# Patient Record
Sex: Female | Born: 1975 | Hispanic: Yes | Marital: Married | Smoking: Never smoker
Health system: Southern US, Community
[De-identification: ages and names within clinical notes are randomized; demographics above are authoritative.]

## PROBLEM LIST (undated history)

## (undated) DIAGNOSIS — E875 Hyperkalemia: Secondary | ICD-10-CM

## (undated) DIAGNOSIS — I10 Essential (primary) hypertension: Secondary | ICD-10-CM

---

## 2016-10-04 ENCOUNTER — Encounter (HOSPITAL_COMMUNITY): Payer: Self-pay | Admitting: Emergency Medicine

## 2016-10-04 ENCOUNTER — Inpatient Hospital Stay (HOSPITAL_COMMUNITY)
Admission: EM | Admit: 2016-10-04 | Discharge: 2016-11-15 | DRG: 003 | Disposition: A | Discharge status: Skilled Nursing Facility | Payer: 59 | Attending: Family Medicine | Admitting: Family Medicine

## 2016-10-04 ENCOUNTER — Emergency Department (HOSPITAL_COMMUNITY): Payer: 59

## 2016-10-04 DIAGNOSIS — Z8673 Personal history of transient ischemic attack (TIA), and cerebral infarction without residual deficits: Secondary | ICD-10-CM | POA: Diagnosis not present

## 2016-10-04 DIAGNOSIS — G9340 Encephalopathy, unspecified: Secondary | ICD-10-CM | POA: Diagnosis present

## 2016-10-04 DIAGNOSIS — R29732 NIHSS score 32: Secondary | ICD-10-CM | POA: Diagnosis present

## 2016-10-04 DIAGNOSIS — I6300 Cerebral infarction due to thrombosis of unspecified precerebral artery: Secondary | ICD-10-CM

## 2016-10-04 DIAGNOSIS — Z982 Presence of cerebrospinal fluid drainage device: Secondary | ICD-10-CM | POA: Diagnosis not present

## 2016-10-04 DIAGNOSIS — J9690 Respiratory failure, unspecified, unspecified whether with hypoxia or hypercapnia: Secondary | ICD-10-CM

## 2016-10-04 DIAGNOSIS — R58 Hemorrhage, not elsewhere classified: Secondary | ICD-10-CM | POA: Diagnosis not present

## 2016-10-04 DIAGNOSIS — R739 Hyperglycemia, unspecified: Secondary | ICD-10-CM | POA: Diagnosis present

## 2016-10-04 DIAGNOSIS — Z7189 Other specified counseling: Secondary | ICD-10-CM

## 2016-10-04 DIAGNOSIS — R259 Unspecified abnormal involuntary movements: Secondary | ICD-10-CM | POA: Diagnosis not present

## 2016-10-04 DIAGNOSIS — R402112 Coma scale, eyes open, never, at arrival to emergency department: Secondary | ICD-10-CM | POA: Diagnosis present

## 2016-10-04 DIAGNOSIS — I619 Nontraumatic intracerebral hemorrhage, unspecified: Secondary | ICD-10-CM

## 2016-10-04 DIAGNOSIS — G911 Obstructive hydrocephalus: Secondary | ICD-10-CM | POA: Diagnosis present

## 2016-10-04 DIAGNOSIS — D72829 Elevated white blood cell count, unspecified: Secondary | ICD-10-CM | POA: Diagnosis not present

## 2016-10-04 DIAGNOSIS — J9601 Acute respiratory failure with hypoxia: Secondary | ICD-10-CM | POA: Diagnosis not present

## 2016-10-04 DIAGNOSIS — Z931 Gastrostomy status: Secondary | ICD-10-CM | POA: Diagnosis not present

## 2016-10-04 DIAGNOSIS — J150 Pneumonia due to Klebsiella pneumoniae: Secondary | ICD-10-CM | POA: Diagnosis not present

## 2016-10-04 DIAGNOSIS — I610 Nontraumatic intracerebral hemorrhage in hemisphere, subcortical: Secondary | ICD-10-CM | POA: Diagnosis present

## 2016-10-04 DIAGNOSIS — R4182 Altered mental status, unspecified: Secondary | ICD-10-CM

## 2016-10-04 DIAGNOSIS — J9600 Acute respiratory failure, unspecified whether with hypoxia or hypercapnia: Secondary | ICD-10-CM | POA: Diagnosis present

## 2016-10-04 DIAGNOSIS — R402212 Coma scale, best verbal response, none, at arrival to emergency department: Secondary | ICD-10-CM | POA: Diagnosis present

## 2016-10-04 DIAGNOSIS — E162 Hypoglycemia, unspecified: Secondary | ICD-10-CM

## 2016-10-04 DIAGNOSIS — J9811 Atelectasis: Secondary | ICD-10-CM | POA: Diagnosis not present

## 2016-10-04 DIAGNOSIS — Z781 Physical restraint status: Secondary | ICD-10-CM

## 2016-10-04 DIAGNOSIS — I160 Hypertensive urgency: Secondary | ICD-10-CM | POA: Diagnosis not present

## 2016-10-04 DIAGNOSIS — Z430 Encounter for attention to tracheostomy: Secondary | ICD-10-CM | POA: Diagnosis not present

## 2016-10-04 DIAGNOSIS — R4189 Other symptoms and signs involving cognitive functions and awareness: Secondary | ICD-10-CM

## 2016-10-04 DIAGNOSIS — I618 Other nontraumatic intracerebral hemorrhage: Secondary | ICD-10-CM | POA: Diagnosis present

## 2016-10-04 DIAGNOSIS — J398 Other specified diseases of upper respiratory tract: Secondary | ICD-10-CM

## 2016-10-04 DIAGNOSIS — Z23 Encounter for immunization: Secondary | ICD-10-CM

## 2016-10-04 DIAGNOSIS — R509 Fever, unspecified: Secondary | ICD-10-CM | POA: Diagnosis not present

## 2016-10-04 DIAGNOSIS — Z66 Do not resuscitate: Secondary | ICD-10-CM | POA: Diagnosis present

## 2016-10-04 DIAGNOSIS — B961 Klebsiella pneumoniae [K. pneumoniae] as the cause of diseases classified elsewhere: Secondary | ICD-10-CM | POA: Diagnosis not present

## 2016-10-04 DIAGNOSIS — R1310 Dysphagia, unspecified: Secondary | ICD-10-CM | POA: Diagnosis present

## 2016-10-04 DIAGNOSIS — R402434 Glasgow coma scale score 3-8, 24 hours or more after hospital admission: Secondary | ICD-10-CM | POA: Diagnosis not present

## 2016-10-04 DIAGNOSIS — G919 Hydrocephalus, unspecified: Secondary | ICD-10-CM

## 2016-10-04 DIAGNOSIS — I639 Cerebral infarction, unspecified: Secondary | ICD-10-CM | POA: Diagnosis present

## 2016-10-04 DIAGNOSIS — Z9911 Dependence on respirator [ventilator] status: Secondary | ICD-10-CM

## 2016-10-04 DIAGNOSIS — Z515 Encounter for palliative care: Secondary | ICD-10-CM | POA: Diagnosis not present

## 2016-10-04 DIAGNOSIS — Z79899 Other long term (current) drug therapy: Secondary | ICD-10-CM

## 2016-10-04 DIAGNOSIS — E440 Moderate protein-calorie malnutrition: Secondary | ICD-10-CM | POA: Diagnosis not present

## 2016-10-04 DIAGNOSIS — Z930 Tracheostomy status: Secondary | ICD-10-CM | POA: Diagnosis not present

## 2016-10-04 DIAGNOSIS — I615 Nontraumatic intracerebral hemorrhage, intraventricular: Secondary | ICD-10-CM | POA: Diagnosis present

## 2016-10-04 DIAGNOSIS — N390 Urinary tract infection, site not specified: Secondary | ICD-10-CM | POA: Diagnosis present

## 2016-10-04 DIAGNOSIS — I10 Essential (primary) hypertension: Secondary | ICD-10-CM | POA: Diagnosis not present

## 2016-10-04 DIAGNOSIS — R339 Retention of urine, unspecified: Secondary | ICD-10-CM | POA: Diagnosis not present

## 2016-10-04 DIAGNOSIS — I119 Hypertensive heart disease without heart failure: Secondary | ICD-10-CM | POA: Diagnosis present

## 2016-10-04 DIAGNOSIS — Z9119 Patient's noncompliance with other medical treatment and regimen: Secondary | ICD-10-CM

## 2016-10-04 DIAGNOSIS — R52 Pain, unspecified: Secondary | ICD-10-CM

## 2016-10-04 DIAGNOSIS — J9610 Chronic respiratory failure, unspecified whether with hypoxia or hypercapnia: Secondary | ICD-10-CM

## 2016-10-04 DIAGNOSIS — I613 Nontraumatic intracerebral hemorrhage in brain stem: Secondary | ICD-10-CM | POA: Diagnosis not present

## 2016-10-04 DIAGNOSIS — R402312 Coma scale, best motor response, none, at arrival to emergency department: Secondary | ICD-10-CM | POA: Diagnosis present

## 2016-10-04 DIAGNOSIS — R0603 Acute respiratory distress: Secondary | ICD-10-CM

## 2016-10-04 DIAGNOSIS — E875 Hyperkalemia: Secondary | ICD-10-CM | POA: Diagnosis not present

## 2016-10-04 DIAGNOSIS — J9620 Acute and chronic respiratory failure, unspecified whether with hypoxia or hypercapnia: Secondary | ICD-10-CM | POA: Diagnosis not present

## 2016-10-04 DIAGNOSIS — Z01818 Encounter for other preprocedural examination: Secondary | ICD-10-CM

## 2016-10-04 HISTORY — DX: Essential (primary) hypertension: I10

## 2016-10-04 HISTORY — DX: Hyperkalemia: E87.5

## 2016-10-04 LAB — I-STAT CHEM 8, ED
BUN: 11 mg/dL (ref 6–20)
CALCIUM ION: 1.04 mmol/L — AB (ref 1.15–1.40)
CHLORIDE: 106 mmol/L (ref 101–111)
Creatinine, Ser: 1 mg/dL (ref 0.44–1.00)
GLUCOSE: 99 mg/dL (ref 65–99)
HCT: 46 % (ref 36.0–46.0)
Hemoglobin: 15.6 g/dL — ABNORMAL HIGH (ref 12.0–15.0)
Potassium: 5.6 mmol/L — ABNORMAL HIGH (ref 3.5–5.1)
Sodium: 138 mmol/L (ref 135–145)
TCO2: 24 mmol/L (ref 0–100)

## 2016-10-04 LAB — I-STAT ARTERIAL BLOOD GAS, ED
Acid-base deficit: 6 mmol/L — ABNORMAL HIGH (ref 0.0–2.0)
BICARBONATE: 20.4 mmol/L (ref 20.0–28.0)
O2 SAT: 100 %
Patient temperature: 101.1
TCO2: 22 mmol/L (ref 0–100)
pCO2 arterial: 44.7 mmHg (ref 32.0–48.0)
pH, Arterial: 7.274 — ABNORMAL LOW (ref 7.350–7.450)
pO2, Arterial: 539 mmHg — ABNORMAL HIGH (ref 83.0–108.0)

## 2016-10-04 LAB — DIFFERENTIAL
BASOS ABS: 0 10*3/uL (ref 0.0–0.1)
Basophils Relative: 0 %
Eosinophils Absolute: 0.1 10*3/uL (ref 0.0–0.7)
Eosinophils Relative: 2 %
LYMPHS ABS: 3.2 10*3/uL (ref 0.7–4.0)
LYMPHS PCT: 34 %
MONOS PCT: 6 %
Monocytes Absolute: 0.6 10*3/uL (ref 0.1–1.0)
NEUTROS ABS: 5.5 10*3/uL (ref 1.7–7.7)
Neutrophils Relative %: 58 %

## 2016-10-04 LAB — URINALYSIS, ROUTINE W REFLEX MICROSCOPIC
Bacteria, UA: NONE SEEN
Bilirubin Urine: NEGATIVE
GLUCOSE, UA: NEGATIVE mg/dL
KETONES UR: NEGATIVE mg/dL
Leukocytes, UA: NEGATIVE
Nitrite: NEGATIVE
PH: 6 (ref 5.0–8.0)
Protein, ur: NEGATIVE mg/dL
Specific Gravity, Urine: 1.013 (ref 1.005–1.030)

## 2016-10-04 LAB — CBC
HEMATOCRIT: 43.6 % (ref 36.0–46.0)
HEMOGLOBIN: 14.7 g/dL (ref 12.0–15.0)
MCH: 29.8 pg (ref 26.0–34.0)
MCHC: 33.7 g/dL (ref 30.0–36.0)
MCV: 88.3 fL (ref 78.0–100.0)
Platelets: 308 10*3/uL (ref 150–400)
RBC: 4.94 MIL/uL (ref 3.87–5.11)
RDW: 12.5 % (ref 11.5–15.5)
WBC: 9.5 10*3/uL (ref 4.0–10.5)

## 2016-10-04 LAB — COMPREHENSIVE METABOLIC PANEL
ALK PHOS: 43 U/L (ref 38–126)
ALT: 19 U/L (ref 14–54)
AST: 32 U/L (ref 15–41)
Albumin: 3.1 g/dL — ABNORMAL LOW (ref 3.5–5.0)
Anion gap: 7 (ref 5–15)
BILIRUBIN TOTAL: 0.8 mg/dL (ref 0.3–1.2)
BUN: 9 mg/dL (ref 6–20)
CALCIUM: 8.3 mg/dL — AB (ref 8.9–10.3)
CO2: 20 mmol/L — ABNORMAL LOW (ref 22–32)
CREATININE: 1.01 mg/dL — AB (ref 0.44–1.00)
Chloride: 107 mmol/L (ref 101–111)
GFR calc Af Amer: >60 mL/min (ref >60)
Glucose, Bld: 96 mg/dL (ref 65–99)
Potassium: 5.2 mmol/L — ABNORMAL HIGH (ref 3.5–5.1)
Sodium: 134 mmol/L — ABNORMAL LOW (ref 135–145)
TOTAL PROTEIN: 6.4 g/dL — AB (ref 6.5–8.1)

## 2016-10-04 LAB — PREGNANCY, URINE: PREG TEST UR: NEGATIVE

## 2016-10-04 LAB — APTT: APTT: 24 s (ref 24–36)

## 2016-10-04 LAB — CBG MONITORING, ED: Glucose-Capillary: 101 mg/dL — ABNORMAL HIGH (ref 65–99)

## 2016-10-04 LAB — RAPID URINE DRUG SCREEN, HOSP PERFORMED
Amphetamines: NOT DETECTED
BARBITURATES: NOT DETECTED
BENZODIAZEPINES: NOT DETECTED
Cocaine: NOT DETECTED
Opiates: NOT DETECTED
Tetrahydrocannabinol: NOT DETECTED

## 2016-10-04 LAB — I-STAT TROPONIN, ED: TROPONIN I, POC: 0 ng/mL (ref 0.00–0.08)

## 2016-10-04 LAB — ETHANOL: Alcohol, Ethyl (B): <5 mg/dL (ref <5)

## 2016-10-04 LAB — PROTIME-INR
INR: 0.94
Prothrombin Time: 12.6 seconds (ref 11.4–15.2)

## 2016-10-04 LAB — TRIGLYCERIDES: Triglycerides: 96 mg/dL (ref <150)

## 2016-10-04 MED ORDER — STROKE: EARLY STAGES OF RECOVERY BOOK
Freq: Once | Status: AC
  Administered 2016-10-05: 1
  Filled 2016-10-04 (×2): qty 1

## 2016-10-04 MED ORDER — ETOMIDATE 2 MG/ML IV SOLN
INTRAVENOUS | Status: AC | PRN
  Administered 2016-10-04: 20 mg via INTRAVENOUS

## 2016-10-04 MED ORDER — SODIUM CHLORIDE 0.9 % IV SOLN
INTRAVENOUS | Status: AC | PRN
  Administered 2016-10-04: 1000 mL via INTRAVENOUS

## 2016-10-04 MED ORDER — SENNOSIDES-DOCUSATE SODIUM 8.6-50 MG PO TABS
1.0000 | ORAL_TABLET | Freq: Two times a day (BID) | ORAL | Status: DC
  Administered 2016-10-05 – 2016-11-15 (×74): 1 via ORAL
  Filled 2016-10-04 (×77): qty 1

## 2016-10-04 MED ORDER — ROCURONIUM BROMIDE 50 MG/5ML IV SOLN
INTRAVENOUS | Status: AC | PRN
  Administered 2016-10-04: 70 mg via INTRAVENOUS

## 2016-10-04 MED ORDER — SODIUM CHLORIDE 0.9 % IV SOLN
250.0000 mL | INTRAVENOUS | Status: DC | PRN
  Administered 2016-10-13: 250 mL via INTRAVENOUS
  Administered 2016-11-02: 15:00:00 via INTRAVENOUS

## 2016-10-04 MED ORDER — FAMOTIDINE IN NACL 20-0.9 MG/50ML-% IV SOLN
20.0000 mg | Freq: Two times a day (BID) | INTRAVENOUS | Status: DC
  Administered 2016-10-05 – 2016-10-19 (×31): 20 mg via INTRAVENOUS
  Filled 2016-10-04 (×32): qty 50

## 2016-10-04 MED ORDER — ACETAMINOPHEN 325 MG PO TABS
650.0000 mg | ORAL_TABLET | ORAL | Status: DC | PRN
  Administered 2016-10-05 – 2016-11-12 (×26): 650 mg via ORAL
  Filled 2016-10-04 (×31): qty 2

## 2016-10-04 MED ORDER — PROPOFOL 1000 MG/100ML IV EMUL
INTRAVENOUS | Status: AC
  Administered 2016-10-04: 10 ug/kg/min via INTRAVENOUS
  Filled 2016-10-04: qty 100

## 2016-10-04 MED ORDER — ORAL CARE MOUTH RINSE
15.0000 mL | OROMUCOSAL | Status: DC
  Administered 2016-10-05 – 2016-10-18 (×134): 15 mL via OROMUCOSAL

## 2016-10-04 MED ORDER — PROPOFOL 1000 MG/100ML IV EMUL
0.0000 ug/kg/min | INTRAVENOUS | Status: DC
  Administered 2016-10-04: 10 ug/kg/min via INTRAVENOUS

## 2016-10-04 MED ORDER — IOPAMIDOL (ISOVUE-370) INJECTION 76%
INTRAVENOUS | Status: AC
Start: 2016-10-04 — End: 2016-10-04
  Administered 2016-10-04: 20:00:00
  Filled 2016-10-04: qty 50

## 2016-10-04 MED ORDER — LABETALOL HCL 5 MG/ML IV SOLN
10.0000 mg | INTRAVENOUS | Status: DC | PRN
  Administered 2016-10-06 – 2016-11-12 (×2): 10 mg via INTRAVENOUS
  Filled 2016-10-04 (×3): qty 4

## 2016-10-04 MED ORDER — CHLORHEXIDINE GLUCONATE 0.12% ORAL RINSE (MEDLINE KIT)
15.0000 mL | Freq: Two times a day (BID) | OROMUCOSAL | Status: DC
  Administered 2016-10-04 – 2016-10-18 (×28): 15 mL via OROMUCOSAL

## 2016-10-04 NOTE — Consult Note (Signed)
Admission H&P    Chief Complaint: Sudden onset of headache with nausea and vomiting, and subsequent loss of consciousness.  HPI: Anne Olsen is an 41 y.o. female with a history of hypertension brought to the emergency room following acute onset of headache which was followed by nausea and projectile vomiting and subsequent loss of consciousness. Patient was intubated for airway protection after arriving in the emergency room. Blood pressure was 161/132 on arrival in the ED. She was started on propofol for sedation. Repeat blood pressure was 196/95. No seizure activity was reported. Patient spouse indicated patient did not take antihypertensive medication today. The scan of her head showed acute parenchymal hemorrhage involving right thalamus and brainstem, with extension to the fourth ventricle as well as subarachnoid extension. CT angiogram of the head and neck was unremarkable with no signs of aneurysm or AVM. ICH score was 3.  LSN: 6:22 PM on 10/04/2016 tPA Given: No: Acute ICH mRankin:  Past Medical History:  Diagnosis Date  . Hypertension     History reviewed. No pertinent surgical history.  History reviewed. No pertinent family history. Social History:  has an unknown smoking status. She has never used smokeless tobacco. Her alcohol and drug histories are not on file.  Allergies: Not on File  Medications: Unavailable.  ROS: Unavailable.  Physical Examination: Blood pressure 196/95, pulse (!) 59, temperature 98.9 F (37.2 C), temperature source Axillary, resp. rate 22, weight 70 kg (154 lb 5.2 oz), SpO2 100 %.  HEENT-  Normocephalic, no lesions, without obvious abnormality.  Normal external eye and conjunctiva.  Normal TM's bilaterally.  Normal auditory canals and external ears. Normal external nose, mucus membranes and septum.  Normal pharynx. Neck supple with no masses, nodes, nodules or enlargement. Cardiovascular - regular rate and rhythm, S1, S2 normal, no murmur,  click, rub or gallop Lungs - chest clear, no wheezing, rales, normal symmetric air entry Abdomen - soft, non-tender; bowel sounds normal; no masses,  no organomegaly Extremities - no joint deformities, effusion, or inflammation and no edema  Neurologic Examination: Patient was unconscious and responsive to external stimuli with movement of right extremities. Pupils were unequal, 5 mm on the right and 3 mm on the left. Neither pupil reactive to light. Eyes were midline and extraocular movements were absent with oculocephalic maneuvers. Face was symmetrical with no focal weakness. Muscle tone was flaccid throughout. Patient had withdrawal movements of right extremities to noxious stimuli. No movement was noted involving left extremities, including noxious stimuli. Deep tendon reflexes were hyperactive throughout with nonsustained bilateral ankle clonus. Plantar responses were extensor bilaterally.   Results for orders placed or performed during the hospital encounter of 10/04/16 (from the past 48 hour(s))  Ethanol     Status: None   Collection Time: 10/04/16  7:25 PM  Result Value Ref Range   Alcohol, Ethyl (B) <5 <5 mg/dL    Comment:        LOWEST DETECTABLE LIMIT FOR SERUM ALCOHOL IS 5 mg/dL FOR MEDICAL PURPOSES ONLY   Protime-INR     Status: None   Collection Time: 10/04/16  7:25 PM  Result Value Ref Range   Prothrombin Time 12.6 11.4 - 15.2 seconds   INR 0.94   APTT     Status: None   Collection Time: 10/04/16  7:25 PM  Result Value Ref Range   aPTT 24 24 - 36 seconds  CBC     Status: None   Collection Time: 10/04/16  7:25 PM  Result Value Ref Range  WBC 9.5 4.0 - 10.5 K/uL   RBC 4.94 3.87 - 5.11 MIL/uL   Hemoglobin 14.7 12.0 - 15.0 g/dL   HCT 43.6 36.0 - 46.0 %   MCV 88.3 78.0 - 100.0 fL   MCH 29.8 26.0 - 34.0 pg   MCHC 33.7 30.0 - 36.0 g/dL   RDW 12.5 11.5 - 15.5 %   Platelets 308 150 - 400 K/uL  Differential     Status: None   Collection Time: 10/04/16  7:25 PM   Result Value Ref Range   Neutrophils Relative % 58 %   Neutro Abs 5.5 1.7 - 7.7 K/uL   Lymphocytes Relative 34 %   Lymphs Abs 3.2 0.7 - 4.0 K/uL   Monocytes Relative 6 %   Monocytes Absolute 0.6 0.1 - 1.0 K/uL   Eosinophils Relative 2 %   Eosinophils Absolute 0.1 0.0 - 0.7 K/uL   Basophils Relative 0 %   Basophils Absolute 0.0 0.0 - 0.1 K/uL  Comprehensive metabolic panel     Status: Abnormal   Collection Time: 10/04/16  7:25 PM  Result Value Ref Range   Sodium 134 (L) 135 - 145 mmol/L   Potassium 5.2 (H) 3.5 - 5.1 mmol/L   Chloride 107 101 - 111 mmol/L   CO2 20 (L) 22 - 32 mmol/L   Glucose, Bld 96 65 - 99 mg/dL   BUN 9 6 - 20 mg/dL   Creatinine, Ser 1.01 (H) 0.44 - 1.00 mg/dL   Calcium 8.3 (L) 8.9 - 10.3 mg/dL   Total Protein 6.4 (L) 6.5 - 8.1 g/dL   Albumin 3.1 (L) 3.5 - 5.0 g/dL   AST 32 15 - 41 U/L   ALT 19 14 - 54 U/L   Alkaline Phosphatase 43 38 - 126 U/L   Total Bilirubin 0.8 0.3 - 1.2 mg/dL   GFR calc non Af Amer >60 >60 mL/min   GFR calc Af Amer >60 >60 mL/min    Comment: (NOTE) The eGFR has been calculated using the CKD EPI equation. This calculation has not been validated in all clinical situations. eGFR's persistently <60 mL/min signify possible Chronic Kidney Disease.    Anion gap 7 5 - 15  I-stat troponin, ED (not at [facility])     Status: None   Collection Time: 10/04/16  7:27 PM  Result Value Ref Range   Troponin i, poc 0.00 0.00 - 0.08 ng/mL   Comment 3            Comment: Due to the release kinetics of cTnI, a negative result within the first hours of the onset of symptoms does not rule out myocardial infarction with certainty. If myocardial infarction is still suspected, repeat the test at appropriate intervals.   I-stat chem 8, ed     Status: Abnormal   Collection Time: 10/04/16  7:28 PM  Result Value Ref Range   Sodium 138 135 - 145 mmol/L   Potassium 5.6 (H) 3.5 - 5.1 mmol/L   Chloride 106 101 - 111 mmol/L   BUN 11 6 - 20 mg/dL    Creatinine, Ser 1.00 0.44 - 1.00 mg/dL   Glucose, Bld 99 65 - 99 mg/dL   Calcium, Ion 1.04 (L) 1.15 - 1.40 mmol/L   TCO2 24 0 - 100 mmol/L   Hemoglobin 15.6 (H) 12.0 - 15.0 g/dL   HCT 46.0 36.0 - 46.0 %  CBG monitoring, ED     Status: Abnormal   Collection Time: 10/04/16  8:06 PM  Result Value Ref Range   Glucose-Capillary 101 (H) 65 - 99 mg/dL   Comment 1 Notify RN    Comment 2 Document in Chart    Ct Angio Head W Or Wo Contrast  Result Date: 10/04/2016 CLINICAL DATA:  Code stroke. EXAM: CT HEAD WITHOUT CONTRAST CT ANGIOGRAPHY OF THE HEAD AND NECK TECHNIQUE: Contiguous axial images were obtained from the base of the skull through the vertex without intravenous contrast. Multidetector CT imaging of the head and neck was performed using the standard protocol during bolus administration of intravenous contrast. Multiplanar CT image reconstructions and MIPs were obtained to evaluate the vascular anatomy. Carotid stenosis measurements (when applicable) are obtained utilizing NASCET criteria, using the distal internal carotid diameter as the denominator. CONTRAST:  50 cc Isovue 370 COMPARISON:  None FINDINGS: CT HEAD Brain: There is an acute intraparenchymal hemorrhage in the right thalamus with intraventricular and subarachnoid penetration. Intraparenchymal hematoma in the right thalamus measures 2.6 x 2.2 x 2.3 cm (volume = 6.9 cm^3) blood is present in the lateral ventricles, third ventricle and filling the fourth ventricle. No other parenchymal brain finding. No conclusive hydrocephalus at this moment. Vascular: No primary vascular finding. Skull: Normal Sinuses/Orbits: Small amount of fluid layering in the left maxillary sinuses. Others clear. Orbits negative. CTA NECK Aortic arch: Aortic arch is normal.  Branching pattern is normal. Right carotid system: Right common carotid artery widely patent to the bifurcation. No atherosclerotic change of the bifurcation. Cervical internal carotid artery is  normal. Left carotid system: Common carotid artery widely patent to the bifurcation. No bifurcation atherosclerosis. Cervical internal carotid artery is normal. Vertebral arteries:Both vertebral arteries widely patent at their origins and through the neck. Skeleton: Negative Other neck: None significant CTA HEAD Anterior circulation: Both internal carotid artery's widely patent through the skullbase and siphon regions. Anterior and middle cerebral vessels are patent without proximal stenosis, aneurysm or vascular malformation. Posterior circulation: Both vertebral arteries widely patent to the basilar. No basilar stenosis. Posterior circulation branch vessels are normal. Venous sinuses: Patent and normal Anatomic variants: None significant Delayed phase: No enhancement IMPRESSION: Acute intraparenchymal hemorrhage in the right thalamus with hematoma measuring 6.9 mL. Intraventricular penetration. Small amount of subarachnoid penetration. CTA of the neck and head do not show any atherosclerotic narrowing. The vessels are somewhat tortuous suggesting hypertension. No sign of aneurysm or vascular malformation to explain the right thalamic hemorrhage. Critical Value/emergent results were called by telephone at the time of interpretation on 10/04/2016 at 8:11 pm to Dr. Davonna Belling , who verbally acknowledged these results. Electronically Signed   By: Nelson Chimes M.D.   On: 10/04/2016 20:13   Ct Angio Neck W And/or Wo Contrast  Result Date: 10/04/2016 CLINICAL DATA:  Code stroke. EXAM: CT HEAD WITHOUT CONTRAST CT ANGIOGRAPHY OF THE HEAD AND NECK TECHNIQUE: Contiguous axial images were obtained from the base of the skull through the vertex without intravenous contrast. Multidetector CT imaging of the head and neck was performed using the standard protocol during bolus administration of intravenous contrast. Multiplanar CT image reconstructions and MIPs were obtained to evaluate the vascular anatomy. Carotid  stenosis measurements (when applicable) are obtained utilizing NASCET criteria, using the distal internal carotid diameter as the denominator. CONTRAST:  50 cc Isovue 370 COMPARISON:  None FINDINGS: CT HEAD Brain: There is an acute intraparenchymal hemorrhage in the right thalamus with intraventricular and subarachnoid penetration. Intraparenchymal hematoma in the right thalamus measures 2.6 x 2.2 x 2.3 cm (volume = 6.9 cm^3) blood is  present in the lateral ventricles, third ventricle and filling the fourth ventricle. No other parenchymal brain finding. No conclusive hydrocephalus at this moment. Vascular: No primary vascular finding. Skull: Normal Sinuses/Orbits: Small amount of fluid layering in the left maxillary sinuses. Others clear. Orbits negative. CTA NECK Aortic arch: Aortic arch is normal.  Branching pattern is normal. Right carotid system: Right common carotid artery widely patent to the bifurcation. No atherosclerotic change of the bifurcation. Cervical internal carotid artery is normal. Left carotid system: Common carotid artery widely patent to the bifurcation. No bifurcation atherosclerosis. Cervical internal carotid artery is normal. Vertebral arteries:Both vertebral arteries widely patent at their origins and through the neck. Skeleton: Negative Other neck: None significant CTA HEAD Anterior circulation: Both internal carotid artery's widely patent through the skullbase and siphon regions. Anterior and middle cerebral vessels are patent without proximal stenosis, aneurysm or vascular malformation. Posterior circulation: Both vertebral arteries widely patent to the basilar. No basilar stenosis. Posterior circulation branch vessels are normal. Venous sinuses: Patent and normal Anatomic variants: None significant Delayed phase: No enhancement IMPRESSION: Acute intraparenchymal hemorrhage in the right thalamus with hematoma measuring 6.9 mL. Intraventricular penetration. Small amount of subarachnoid  penetration. CTA of the neck and head do not show any atherosclerotic narrowing. The vessels are somewhat tortuous suggesting hypertension. No sign of aneurysm or vascular malformation to explain the right thalamic hemorrhage. Critical Value/emergent results were called by telephone at the time of interpretation on 10/04/2016 at 8:11 pm to Dr. Davonna Belling , who verbally acknowledged these results. Electronically Signed   By: Nelson Chimes M.D.   On: 10/04/2016 20:13   Ct Head Code Stroke W/o Cm  Result Date: 10/04/2016 CLINICAL DATA:  Code stroke. EXAM: CT HEAD WITHOUT CONTRAST CT ANGIOGRAPHY OF THE HEAD AND NECK TECHNIQUE: Contiguous axial images were obtained from the base of the skull through the vertex without intravenous contrast. Multidetector CT imaging of the head and neck was performed using the standard protocol during bolus administration of intravenous contrast. Multiplanar CT image reconstructions and MIPs were obtained to evaluate the vascular anatomy. Carotid stenosis measurements (when applicable) are obtained utilizing NASCET criteria, using the distal internal carotid diameter as the denominator. CONTRAST:  50 cc Isovue 370 COMPARISON:  None FINDINGS: CT HEAD Brain: There is an acute intraparenchymal hemorrhage in the right thalamus with intraventricular and subarachnoid penetration. Intraparenchymal hematoma in the right thalamus measures 2.6 x 2.2 x 2.3 cm (volume = 6.9 cm^3) blood is present in the lateral ventricles, third ventricle and filling the fourth ventricle. No other parenchymal brain finding. No conclusive hydrocephalus at this moment. Vascular: No primary vascular finding. Skull: Normal Sinuses/Orbits: Small amount of fluid layering in the left maxillary sinuses. Others clear. Orbits negative. CTA NECK Aortic arch: Aortic arch is normal.  Branching pattern is normal. Right carotid system: Right common carotid artery widely patent to the bifurcation. No atherosclerotic change of  the bifurcation. Cervical internal carotid artery is normal. Left carotid system: Common carotid artery widely patent to the bifurcation. No bifurcation atherosclerosis. Cervical internal carotid artery is normal. Vertebral arteries:Both vertebral arteries widely patent at their origins and through the neck. Skeleton: Negative Other neck: None significant CTA HEAD Anterior circulation: Both internal carotid artery's widely patent through the skullbase and siphon regions. Anterior and middle cerebral vessels are patent without proximal stenosis, aneurysm or vascular malformation. Posterior circulation: Both vertebral arteries widely patent to the basilar. No basilar stenosis. Posterior circulation branch vessels are normal. Venous sinuses: Patent and normal Anatomic  variants: None significant Delayed phase: No enhancement IMPRESSION: Acute intraparenchymal hemorrhage in the right thalamus with hematoma measuring 6.9 mL. Intraventricular penetration. Small amount of subarachnoid penetration. CTA of the neck and head do not show any atherosclerotic narrowing. The vessels are somewhat tortuous suggesting hypertension. No sign of aneurysm or vascular malformation to explain the right thalamic hemorrhage. Critical Value/emergent results were called by telephone at the time of interpretation on 10/04/2016 at 8:11 pm to Dr. Davonna Belling , who verbally acknowledged these results. Electronically Signed   By: Nelson Chimes M.D.   On: 10/04/2016 20:13    Assessment: 41 y.o. female with a history of hypertension presenting with acute unresponsive state with right thalamic hemorrhage with extension into midbrain and pons as well as both ventricle and subarachnoid space.  Stroke Risk Factors - hypertension  Plan: 1. Neuro ICU admission 2. Repeat CT scan of the head in the next 12-24 hours 3. PT consult, OT consult, Speech consult 4. Echocardiogram 5. Prophylactic therapy-None 6. Hypercoagulopathy panel 7.  HgbA1c,  fasting lipid panel  C.R. Nicole Kindred, MD [facility] [phone]  10/04/2016, 8:30 PM

## 2016-10-04 NOTE — ED Notes (Signed)
Pt's CBG result was 101. Informed Huntley DecSara - RN.

## 2016-10-04 NOTE — ED Notes (Signed)
[facility] intensivists at bedside

## 2016-10-04 NOTE — ED Notes (Signed)
Pt shivering; 2 warm blankets placed; Oral temp 101.1; Rubin PayorPickering, MD notified

## 2016-10-04 NOTE — ED Notes (Signed)
Patient transported to CT 

## 2016-10-04 NOTE — Progress Notes (Signed)
After pt transported to [facility], showed her husband Anne Olsen the chapel where he could pray if he wished. He was pleased to see it and declined a copy of the Quran, saying he had it on his phone. He thanked me for being so nice, requesting I pray for him and his wife Anne Olsen. Assured him I would -- and that he can ask pt's nurse to page for a chaplain any time if he wished. When he'd said they were Muslim, I'd asked if he had an imam he'd like to call (no) -- and also let him know we could call one if he wished (no).   Also let [facility] reception know that Anne Olsen did not necessarily understand what a neuro ICU was, as he'd asked me if the rm to which pt would be transferred would be as "functional" as the trauma rm in which he had stayed w/ her in ED. He seemed not to assume so, and not to wish her to leave the trauma rm if not. Assured him she'd be going to a floor where specialists in what she needed would be taking care of her. Chaplain available for f/u.   10/04/16 2300  Clinical Encounter Type  Visited With Family  Visit Type Follow-up;Psychological support;Spiritual support;Social support;Critical Care  Referral From Nurse  Spiritual Encounters  Spiritual Needs Prayer;Emotional  Stress Factors  Patient Stress Factors Health changes;Loss of control  Family Stress Factors Family relationships;Health changes;Loss of control   Ephraim Hamburgerynthia A Jovan Schickling, [address]

## 2016-10-04 NOTE — H&P (Addendum)
Name: Anne Olsen MRN: [id number] DOB: December 09, 1975    LOS: 0  PCCM ADMISSION NOTE  History of Present Illness: Patient is a 41 yo F pmhx of HTN who presented today via EMS for headache, weakness, and AMS. History was provided entirely by the husband HumboldtMohammad. 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 He got home from work around 5 pm when she developed a sudden headache and screamed out loud. Patient told him she had never had a headache like this before. She developed weakness on her left side and became very sleepy. Husband said he tried feeding her and putting her to bed, but she became more and more lethargic and he called 911.   On arrival to the ED, blood pressure was 161/132. Pupils were noted to be unequal. She was very lethargic on non rebreather. She subsequently became unresponsive and was intubated for airway protection and started on propofol for sedation. Repeat blood pressure was 196/95. CT head revealed an acute intraparenchymal hemorrhage in the right thalamus with a hematoma measuring 6.9 mL. Per husband, patient's only other medical problems include issues with high potassium. She started taking birth control pills about 2 months ago. She exercises regularly and is otherwise healthy. Husband is unaware of hypertension or aneurysms that run in her family.   Lines / Drains: ETT 1/25 >>  Cultures: None   Antibiotics: None  Tests / Events: Intubate / admit 1/25 >>   The patient is sedated, intubated and unable to provide history, which was provided via medical record and patient's husband SomersworthMohammad.   Past Medical History:  Diagnosis Date  . Hypertension    History reviewed. No pertinent surgical history. Prior to Admission medications   Not on File   Allergies Not on File  Family History History reviewed. No pertinent family history.  Social History  has an unknown smoking status. She has never  used smokeless tobacco. Her alcohol and drug histories are not on file.  Review Of Systems  11 points review of systems is negative with an exception of listed in HPI.  Vital Signs: Temp:  [98.9 F (37.2 C)-101.1 F (38.4 C)] 101.1 F (38.4 C) (01/25 2036) Pulse Rate:  [57-105] 60 (01/25 2145) Resp:  [12-26] 23 (01/25 2145) BP: (132-196)/(79-132) 139/79 (01/25 2145) SpO2:  [95 %-100 %] 100 % (01/25 2145) FiO2 (%):  [50 %-100 %] 50 % (01/25 2040) Weight:  [70 kg (154 lb 5.2 oz)] 70 kg (154 lb 5.2 oz) (01/25 1931) No intake/output data recorded.  Physical Examination: General:  Sedated on vent - unresponsive when sedation is turned off  Neuro:  Unequal pupils: 4 mm on right and 2 mm on left, both non reactive to light, no corneal reflex bilaterally  HEENT:  Endotracheally intubated  Neck:  Supple, trachea midline   Cardiovascular:  RRR, no murmurs, rubs, or gallops  Lungs:  CTAB  Abdomen:  Soft, non tender, non distended, normal bowel sounds  Extremities: No edema, distal pulses intact. Negative plantar reflexes bilaterally. 1+ patellar reflex on the right, no patellar reflex on left. Did not withdrawal to pain on all 4 extremities  Skin:  No rashes or erythema   Ventilator settings: Vent Mode: PRVC FiO2 (%):  [50 %-100 %] 50 % Set Rate:  [18 bmp-22 bmp] 22 bmp Vt Set:  [380 mL] 380 mL PEEP:  [5 cmH20-8 cmH20] 5 cmH20 Plateau Pressure:  [18 cmH20] 18 cmH20  Labs and  Imaging:  Personally Reviewed.   CT Head 1/25 >> Intraparenchymal hemorrhage, no sign of aneurysm or vascular malformation  CXR 1/25 >> vascular congestion, mild cardiomegaly, mild patchy left-sided airspace opacity concerning for ATX vs. interstitial edema   Assessment and Plan:  PULMONARY  ASSESSMENT: Altered, unable to protect airway  Endotracheally intubated  PLAN:   Full vent support  ABG  CARDIOVASCULAR  ASSESSMENT:  Hypertensive  Mild cardiomegaly on CXR PLAN:  Labetalol prn for SBP > 150  Q4  vital checks  Echocardiogram  Lipid panel   RENAL  ASSESSMENT:   No acute issues  Hx of hyperkalemia - 5.6 on admission  PLAN:   Follow BMP   GASTROINTESTINAL  ASSESSMENT:   Projectile vomiting; secondary to intracranial hemorrhage  PLAN:   NPO  Famotidine IV ppx   HEMATOLOGIC  ASSESSMENT:   Intracranial hemorrhage; hemoglobin stable PLAN:  Monitor CBC  Transfuse for hgb < 7.0 Hypercoagulopathy panel per neurology   INFECTIOUS  ASSESSMENT:   No acute issues  PLAN:   No plan   ENDOCRINE  ASSESSMENT:   No acute issues   PLAN:   Follow CBG Checking A1C  NEUROLOGIC  ASSESSMENT:   Acute intracranial hemorrhage - likely secondary to uncontrolled HTN ? Sedated on vent PLAN:   Neurology consult; appreciate recommendations Repeat head CT 12-24 hours Labetalol prn for SBP > 150  Serial neuro checks  PT, OT, Speech consult Overall poor prognosis - chaplain at bedside Propofol, RASS goal -1   Family Update: Husband at bedside. Updated on plan to continue to monitor and repeat ehad CT in 12-24 hours. Overall poor prognosis. Husband would like to continue with aggressive care and keep FULL code at this time. Provided support and answered all questions.    Reymundo Poll, M.D. Pager: [phone] 10/04/2016, 10:11 PM   STAFF NOTE: I, Rory Percy, MD FACP have personally reviewed patient's available data, including medical history, events of note, physical examination and test results as part of my evaluation. I have discussed with resident/NP and other care providers such as pharmacist, RN and RRT. In addition, I personally evaluated patient and elicited key findings of:  Unresponsive, pupil rt 4 mm nonreactive, el ft 2 mm, no movement off prop x 1 hour, after d/w neuro - the blood extends into brainstem giving this horrible neurologic examination, husband did describe shacking and she is at risk for subclinical seizures, consider eeg and empiric keppra (  neurology unimpressed), consider MRI, abg reviewed, increased rate, repeat abg, no need further prop, int fent okay if noted pain, ppi, scd, prognosis is poor, have updated husband at bedside, currently MAP controlled in range for recommendation from neuro The patient is critically ill with multiple organ systems failure and requires high complexity decision making for assessment and support, frequent evaluation and titration of therapies, application of advanced monitoring technologies and extensive interpretation of multiple databases.   Critical Care Time devoted to patient care services described in this note is 30 Minutes. This time reflects time of care of this signee: Rory Percy, MD FACP. This critical care time does not reflect procedure time, or teaching time or supervisory time of PA/NP/Med student/Med Resident etc but could involve care discussion time. Rest per NP/medical resident whose note is outlined above and that I agree with   Mcarthur Rossetti. Tyson Alias, MD, FACP Pgr: [phone] [facility] 10/05/2016 12:13 AM

## 2016-10-04 NOTE — Progress Notes (Signed)
RT NOTE:  Pt transported to 3M14 without event. [facility].

## 2016-10-04 NOTE — Progress Notes (Signed)
Responded to Dr's head's-up re: this pt in TRAU C while in ED for another pt. Introduced self to husband at bedside, who thanked and said he had no present needs.Nurse said he could hold pt's hand. Pt's Dr informed me outside rm that he'd told husband she might not make it. Chaplain available for f/u.   10/04/16 2000  Clinical Encounter Type  Visited With Family  Visit Type Initial;ED  Referral From Nurse  Spiritual Encounters  Spiritual Needs Emotional  Stress Factors  Patient Stress Factors Health changes;Loss of control  Family Stress Factors Family relationships;Health changes   Ephraim Hamburgerynthia A Aarya Quebedeaux, Chaplain

## 2016-10-04 NOTE — ED Triage Notes (Signed)
Pt presents with [facility] for headache with sudden projectile vomiting; pt has hx of HTN per family on scene; LSN was [id number] by family; EMS reports unequal pupils; VSS (110/palpated, 64 HR, 95%NRB, CBG 74); upon arrival to TRA C, gag reflex was assessed and decision to intubate was made

## 2016-10-04 NOTE — ED Provider Notes (Addendum)
[facility] DEPT Provider Note   CSN: [id number] Arrival date & time: 10/04/16  7829   An emergency department physician performed an initial assessment on this suspected stroke patient at 1.  History   Chief Complaint Chief Complaint  Patient presents with  . Code Stroke   Level 5 caveat due to nonverbal status. HPI Anne Olsen is a 41 y.o. female.  HPI Patient presents as a code stroke. Last seen normal at 620. Found patient unresponsive. Somewhat hypertensive for EMS. Nonverbal and had projectile vomiting. History of hypertension.   Past Medical History:  Diagnosis Date  . Hypertension     Patient Active Problem List   Diagnosis Date Noted  . ICH (intracerebral hemorrhage) ([place]) 10/04/2016    History reviewed. No pertinent surgical history.  OB History    No data available       Home Medications    Prior to Admission medications   Not on File    Family History History reviewed. No pertinent family history.  Social History Social History  Substance Use Topics  . Smoking status: Unknown If Ever Smoked  . Smokeless tobacco: Never Used  . Alcohol use Not on file     Allergies   Patient has no allergy information on record.   Review of Systems Review of Systems  Unable to perform ROS: Mental status change     Physical Exam Updated Vital Signs BP 137/77   Pulse (!) 59   Temp 101.1 F (38.4 C) (Oral)   Resp 23   Ht '5\' 1"'$  (1.549 m)   Wt 154 lb 5.2 oz (70 kg) Comment: ESTIMATED  LMP  (LMP Unknown)   SpO2 100%   BMI 29.16 kg/m   Physical Exam  Constitutional: She appears well-developed.  HENT:  Head: Atraumatic.  Eyes:  Pupils minimally responsive. Right pupil around 4 mm and left pupil 2 mm.  Neck: Neck supple.  Cardiovascular: Normal rate.   Pulmonary/Chest:  Somewhat harsh breath sounds transmitted from upper airway  Abdominal: She exhibits no distension.  Musculoskeletal: She exhibits no edema.  Neurological:    Patient is minimally responsive. Slight to no gag reflex. Pupils unequal. Had vomited. Some slight purposeful movements on right side. Did not withdraw from pain on IV on right side. Has clonus bilaterally but worse on right. Minimal movement on left side.  Skin: Skin is warm. Capillary refill takes less than 2 seconds.     ED Treatments / Results  Labs (all labs ordered are listed, but only abnormal results are displayed) Labs Reviewed  COMPREHENSIVE METABOLIC PANEL - Abnormal; Notable for the following:       Result Value   Sodium 134 (*)    Potassium 5.2 (*)    CO2 20 (*)    Creatinine, Ser 1.01 (*)    Calcium 8.3 (*)    Total Protein 6.4 (*)    Albumin 3.1 (*)    All other components within normal limits  URINALYSIS, ROUTINE W REFLEX MICROSCOPIC - Abnormal; Notable for the following:    Hgb urine dipstick SMALL (*)    Squamous Epithelial / LPF 0-5 (*)    All other components within normal limits  I-STAT CHEM 8, ED - Abnormal; Notable for the following:    Potassium 5.6 (*)    Calcium, Ion 1.04 (*)    Hemoglobin 15.6 (*)    All other components within normal limits  CBG MONITORING, ED - Abnormal; Notable for the following:    Glucose-Capillary 101 (*)  All other components within normal limits  I-STAT ARTERIAL BLOOD GAS, ED - Abnormal; Notable for the following:    pH, Arterial 7.274 (*)    pO2, Arterial 539.0 (*)    Acid-base deficit 6.0 (*)    All other components within normal limits  TRIGLYCERIDES  ETHANOL  PROTIME-INR  APTT  CBC  DIFFERENTIAL  RAPID URINE DRUG SCREEN, HOSP PERFORMED  PREGNANCY, URINE  BLOOD GAS, ARTERIAL  CBC  BASIC METABOLIC PANEL  I-STAT CHEM 8, ED  I-STAT TROPOININ, ED    EKG  EKG Interpretation None       Radiology Ct Angio Head W Or Wo Contrast  Result Date: 10/04/2016 CLINICAL DATA:  Code stroke. EXAM: CT HEAD WITHOUT CONTRAST CT ANGIOGRAPHY OF THE HEAD AND NECK TECHNIQUE: Contiguous axial images were obtained from the  base of the skull through the vertex without intravenous contrast. Multidetector CT imaging of the head and neck was performed using the standard protocol during bolus administration of intravenous contrast. Multiplanar CT image reconstructions and MIPs were obtained to evaluate the vascular anatomy. Carotid stenosis measurements (when applicable) are obtained utilizing NASCET criteria, using the distal internal carotid diameter as the denominator. CONTRAST:  50 cc Isovue 370 COMPARISON:  None FINDINGS: CT HEAD Brain: There is an acute intraparenchymal hemorrhage in the right thalamus with intraventricular and subarachnoid penetration. Intraparenchymal hematoma in the right thalamus measures 2.6 x 2.2 x 2.3 cm (volume = 6.9 cm^3) blood is present in the lateral ventricles, third ventricle and filling the fourth ventricle. No other parenchymal brain finding. No conclusive hydrocephalus at this moment. Vascular: No primary vascular finding. Skull: Normal Sinuses/Orbits: Small amount of fluid layering in the left maxillary sinuses. Others clear. Orbits negative. CTA NECK Aortic arch: Aortic arch is normal.  Branching pattern is normal. Right carotid system: Right common carotid artery widely patent to the bifurcation. No atherosclerotic change of the bifurcation. Cervical internal carotid artery is normal. Left carotid system: Common carotid artery widely patent to the bifurcation. No bifurcation atherosclerosis. Cervical internal carotid artery is normal. Vertebral arteries:Both vertebral arteries widely patent at their origins and through the neck. Skeleton: Negative Other neck: None significant CTA HEAD Anterior circulation: Both internal carotid artery's widely patent through the skullbase and siphon regions. Anterior and middle cerebral vessels are patent without proximal stenosis, aneurysm or vascular malformation. Posterior circulation: Both vertebral arteries widely patent to the basilar. No basilar stenosis.  Posterior circulation branch vessels are normal. Venous sinuses: Patent and normal Anatomic variants: None significant Delayed phase: No enhancement IMPRESSION: Acute intraparenchymal hemorrhage in the right thalamus with hematoma measuring 6.9 mL. Intraventricular penetration. Small amount of subarachnoid penetration. CTA of the neck and head do not show any atherosclerotic narrowing. The vessels are somewhat tortuous suggesting hypertension. No sign of aneurysm or vascular malformation to explain the right thalamic hemorrhage. Critical Value/emergent results were called by telephone at the time of interpretation on 10/04/2016 at 8:11 pm to Dr. Benjiman Core , who verbally acknowledged these results. Electronically Signed   By: Paulina Fusi M.D.   On: 10/04/2016 20:13   Ct Angio Neck W And/or Wo Contrast  Result Date: 10/04/2016 CLINICAL DATA:  Code stroke. EXAM: CT HEAD WITHOUT CONTRAST CT ANGIOGRAPHY OF THE HEAD AND NECK TECHNIQUE: Contiguous axial images were obtained from the base of the skull through the vertex without intravenous contrast. Multidetector CT imaging of the head and neck was performed using the standard protocol during bolus administration of intravenous contrast. Multiplanar CT image reconstructions and  MIPs were obtained to evaluate the vascular anatomy. Carotid stenosis measurements (when applicable) are obtained utilizing NASCET criteria, using the distal internal carotid diameter as the denominator. CONTRAST:  50 cc Isovue 370 COMPARISON:  None FINDINGS: CT HEAD Brain: There is an acute intraparenchymal hemorrhage in the right thalamus with intraventricular and subarachnoid penetration. Intraparenchymal hematoma in the right thalamus measures 2.6 x 2.2 x 2.3 cm (volume = 6.9 cm^3) blood is present in the lateral ventricles, third ventricle and filling the fourth ventricle. No other parenchymal brain finding. No conclusive hydrocephalus at this moment. Vascular: No primary vascular  finding. Skull: Normal Sinuses/Orbits: Small amount of fluid layering in the left maxillary sinuses. Others clear. Orbits negative. CTA NECK Aortic arch: Aortic arch is normal.  Branching pattern is normal. Right carotid system: Right common carotid artery widely patent to the bifurcation. No atherosclerotic change of the bifurcation. Cervical internal carotid artery is normal. Left carotid system: Common carotid artery widely patent to the bifurcation. No bifurcation atherosclerosis. Cervical internal carotid artery is normal. Vertebral arteries:Both vertebral arteries widely patent at their origins and through the neck. Skeleton: Negative Other neck: None significant CTA HEAD Anterior circulation: Both internal carotid artery's widely patent through the skullbase and siphon regions. Anterior and middle cerebral vessels are patent without proximal stenosis, aneurysm or vascular malformation. Posterior circulation: Both vertebral arteries widely patent to the basilar. No basilar stenosis. Posterior circulation branch vessels are normal. Venous sinuses: Patent and normal Anatomic variants: None significant Delayed phase: No enhancement IMPRESSION: Acute intraparenchymal hemorrhage in the right thalamus with hematoma measuring 6.9 mL. Intraventricular penetration. Small amount of subarachnoid penetration. CTA of the neck and head do not show any atherosclerotic narrowing. The vessels are somewhat tortuous suggesting hypertension. No sign of aneurysm or vascular malformation to explain the right thalamic hemorrhage. Critical Value/emergent results were called by telephone at the time of interpretation on 10/04/2016 at 8:11 pm to Dr. Davonna Belling , who verbally acknowledged these results. Electronically Signed   By: Nelson Chimes M.D.   On: 10/04/2016 20:13   Dg Chest Portable 1 View  Result Date: 10/04/2016 CLINICAL DATA:  Endotracheal tube and orogastric tube placement. Initial encounter. EXAM: PORTABLE CHEST 1  VIEW COMPARISON:  None. FINDINGS: The patient's endotracheal tube is seen ending 2 cm above the carina. An enteric tube is noted ending overlying the body of the stomach, with the side port about the gastroesophageal junction. Vascular congestion is noted. Mild patchy left-sided airspace opacity may reflect mild interstitial edema or atelectasis. No pleural effusion or pneumothorax is seen. The cardiomediastinal silhouette is mildly enlarged. No acute osseous abnormalities are identified. IMPRESSION: 1. Endotracheal tube seen ending 2 cm above the carina. 2. Enteric tube noted ending overlying the body of the stomach, with the side port about the gastroesophageal junction. 3. Vascular congestion and mild cardiomegaly. Mild patchy left-sided airspace opacity may reflect atelectasis or mild interstitial edema. Electronically Signed   By: Garald Balding M.D.   On: 10/04/2016 20:43   Ct Head Code Stroke W/o Cm  Result Date: 10/04/2016 CLINICAL DATA:  Code stroke. EXAM: CT HEAD WITHOUT CONTRAST CT ANGIOGRAPHY OF THE HEAD AND NECK TECHNIQUE: Contiguous axial images were obtained from the base of the skull through the vertex without intravenous contrast. Multidetector CT imaging of the head and neck was performed using the standard protocol during bolus administration of intravenous contrast. Multiplanar CT image reconstructions and MIPs were obtained to evaluate the vascular anatomy. Carotid stenosis measurements (when applicable) are obtained utilizing  NASCET criteria, using the distal internal carotid diameter as the denominator. CONTRAST:  50 cc Isovue 370 COMPARISON:  None FINDINGS: CT HEAD Brain: There is an acute intraparenchymal hemorrhage in the right thalamus with intraventricular and subarachnoid penetration. Intraparenchymal hematoma in the right thalamus measures 2.6 x 2.2 x 2.3 cm (volume = 6.9 cm^3) blood is present in the lateral ventricles, third ventricle and filling the fourth ventricle. No other  parenchymal brain finding. No conclusive hydrocephalus at this moment. Vascular: No primary vascular finding. Skull: Normal Sinuses/Orbits: Small amount of fluid layering in the left maxillary sinuses. Others clear. Orbits negative. CTA NECK Aortic arch: Aortic arch is normal.  Branching pattern is normal. Right carotid system: Right common carotid artery widely patent to the bifurcation. No atherosclerotic change of the bifurcation. Cervical internal carotid artery is normal. Left carotid system: Common carotid artery widely patent to the bifurcation. No bifurcation atherosclerosis. Cervical internal carotid artery is normal. Vertebral arteries:Both vertebral arteries widely patent at their origins and through the neck. Skeleton: Negative Other neck: None significant CTA HEAD Anterior circulation: Both internal carotid artery's widely patent through the skullbase and siphon regions. Anterior and middle cerebral vessels are patent without proximal stenosis, aneurysm or vascular malformation. Posterior circulation: Both vertebral arteries widely patent to the basilar. No basilar stenosis. Posterior circulation branch vessels are normal. Venous sinuses: Patent and normal Anatomic variants: None significant Delayed phase: No enhancement IMPRESSION: Acute intraparenchymal hemorrhage in the right thalamus with hematoma measuring 6.9 mL. Intraventricular penetration. Small amount of subarachnoid penetration. CTA of the neck and head do not show any atherosclerotic narrowing. The vessels are somewhat tortuous suggesting hypertension. No sign of aneurysm or vascular malformation to explain the right thalamic hemorrhage. Critical Value/emergent results were called by telephone at the time of interpretation on 10/04/2016 at 8:11 pm to Dr. Davonna Belling , who verbally acknowledged these results. Electronically Signed   By: Nelson Chimes M.D.   On: 10/04/2016 20:13    Procedures Procedures (including critical care  time)  Medications Ordered in ED Medications  propofol (DIPRIVAN) 1000 MG/100ML infusion (10 mcg/kg/min  70 kg Intravenous Restarted 10/04/16 2142)  0.9 %  sodium chloride infusion (not administered)   stroke: mapping our early stages of recovery book (not administered)  senna-docusate (Senokot-S) tablet 1 tablet (not administered)  famotidine (PEPCID) IVPB 20 mg premix (not administered)  acetaminophen (TYLENOL) tablet 650 mg (not administered)  labetalol (NORMODYNE,TRANDATE) injection 10 mg (not administered)  0.9 %  sodium chloride infusion ( Intravenous Rate/Dose Change 10/04/16 2020)  etomidate (AMIDATE) injection (20 mg Intravenous Given 10/04/16 1925)  rocuronium (ZEMURON) injection (70 mg Intravenous Given 10/04/16 1926)  iopamidol (ISOVUE-370) 76 % injection (  Contrast Given 10/04/16 1945)     Initial Impression / Assessment and Plan / ED Course  I have reviewed the triage vital signs and the nursing notes.  Pertinent labs & imaging results that were available during my care of the patient were reviewed by me and considered in my medical decision making (see chart for details).     Patient with large intracranial hemorrhage. Goes from thalamus down the brainstem. Intubated for airway protection by myself. Met shortly after arrival by Dr. Nicole Kindred from neurology. Poor prognosis overall. Admitted to ICU, with whom I discussed the patient.  CRITICAL CARE Performed by: Mackie Pai Total critical care time: 30 minutes Critical care time was exclusive of separately billable procedures and treating other patients. Critical care was necessary to treat or prevent imminent or life-threatening deterioration.  Critical care was time spent personally by me on the following activities: development of treatment plan with patient and/or surrogate as well as nursing, discussions with consultants, evaluation of patient's response to treatment, examination of patient, obtaining history from  patient or surrogate, ordering and performing treatments and interventions, ordering and review of laboratory studies, ordering and review of radiographic studies, pulse oximetry and re-evaluation of patient's condition.     Final Clinical Impressions(s) / ED Diagnoses   Final diagnoses:  Hemorrhagic stroke ([place])  [place] (intracerebral hemorrhage) ([place])    New Prescriptions New Prescriptions   No medications on file     Davonna Belling, MD 10/04/16 2214    Davonna Belling, MD 10/11/16 0002

## 2016-10-04 NOTE — ED Notes (Signed)
Portable chest xray in progress.

## 2016-10-04 NOTE — Progress Notes (Signed)
Discussed pt's status w/ 2 of her drs, who said her condition would likely not change overnight and she'll be transferred to [facility]. Shared information from/about husband Muhammed w/ them, and accompanied them to talk w/ him. Nurse also saw husband seeming to contact people on his phone, but no family have arrived to be w/ him. She said critical care was direct w/ him as to his wife's likely poor prognosis. Husband shared w/ me how worried he is about his wife, and that they are Muslim, but does not now request any spiritual/emotional support for himself. 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 Chaplain available for f/u.   10/04/16 2100  Clinical Encounter Type  Visited With Family  Visit Type Follow-up;ED  Referral From Nurse   Ephraim Hamburgerynthia A Daylan Juhnke, Chaplain

## 2016-10-04 NOTE — Progress Notes (Signed)
RT NOTE:  Pt transported to CT.

## 2016-10-04 NOTE — Sedation Documentation (Signed)
Anne Olsen Neuro MD at bedside

## 2016-10-04 NOTE — Progress Notes (Signed)
RT NOTE:  ABG drawn.

## 2016-10-05 ENCOUNTER — Inpatient Hospital Stay (HOSPITAL_COMMUNITY): Payer: 59

## 2016-10-05 ENCOUNTER — Other Ambulatory Visit (HOSPITAL_COMMUNITY): Payer: 59

## 2016-10-05 ENCOUNTER — Encounter (HOSPITAL_COMMUNITY): Payer: Self-pay | Admitting: *Deleted

## 2016-10-05 DIAGNOSIS — I619 Nontraumatic intracerebral hemorrhage, unspecified: Secondary | ICD-10-CM

## 2016-10-05 DIAGNOSIS — I613 Nontraumatic intracerebral hemorrhage in brain stem: Secondary | ICD-10-CM

## 2016-10-05 LAB — BLOOD GAS, ARTERIAL
Acid-base deficit: 5.4 mmol/L — ABNORMAL HIGH (ref 0.0–2.0)
BICARBONATE: 19.1 mmol/L — AB (ref 20.0–28.0)
DRAWN BY: 44135
FIO2: 40
MECHVT: 380 mL
O2 SAT: 98.9 %
Patient temperature: 100.6
RATE: 22 resp/min
pCO2 arterial: 37.5 mmHg (ref 32.0–48.0)
pH, Arterial: 7.334 — ABNORMAL LOW (ref 7.350–7.450)
pO2, Arterial: 177 mmHg — ABNORMAL HIGH (ref 83.0–108.0)

## 2016-10-05 LAB — BASIC METABOLIC PANEL
ANION GAP: 5 (ref 5–15)
Anion gap: 6 (ref 5–15)
BUN: 7 mg/dL (ref 6–20)
BUN: 8 mg/dL (ref 6–20)
CALCIUM: 8.3 mg/dL — AB (ref 8.9–10.3)
CALCIUM: 8.3 mg/dL — AB (ref 8.9–10.3)
CO2: 21 mmol/L — ABNORMAL LOW (ref 22–32)
CO2: 22 mmol/L (ref 22–32)
Chloride: 107 mmol/L (ref 101–111)
Chloride: 108 mmol/L (ref 101–111)
Creatinine, Ser: 0.9 mg/dL (ref 0.44–1.00)
Creatinine, Ser: 0.97 mg/dL (ref 0.44–1.00)
GFR calc Af Amer: >60 mL/min (ref >60)
GLUCOSE: 120 mg/dL — AB (ref 65–99)
Glucose, Bld: 112 mg/dL — ABNORMAL HIGH (ref 65–99)
POTASSIUM: 5.5 mmol/L — AB (ref 3.5–5.1)
POTASSIUM: 5 mmol/L (ref 3.5–5.1)
Sodium: 134 mmol/L — ABNORMAL LOW (ref 135–145)
Sodium: 135 mmol/L (ref 135–145)

## 2016-10-05 LAB — CBC
HCT: 46.5 % — ABNORMAL HIGH (ref 36.0–46.0)
Hemoglobin: 15.7 g/dL — ABNORMAL HIGH (ref 12.0–15.0)
MCH: 29.8 pg (ref 26.0–34.0)
MCHC: 33.8 g/dL (ref 30.0–36.0)
MCV: 88.2 fL (ref 78.0–100.0)
Platelets: 337 10*3/uL (ref 150–400)
RBC: 5.27 MIL/uL — ABNORMAL HIGH (ref 3.87–5.11)
RDW: 12.4 % (ref 11.5–15.5)
WBC: 18 10*3/uL — ABNORMAL HIGH (ref 4.0–10.5)

## 2016-10-05 LAB — MRSA PCR SCREENING: MRSA by PCR: NEGATIVE

## 2016-10-05 MED ORDER — SODIUM CHLORIDE 0.9 % IV SOLN
3.0000 g | Freq: Four times a day (QID) | INTRAVENOUS | Status: DC
  Administered 2016-10-05 – 2016-10-11 (×24): 3 g via INTRAVENOUS
  Filled 2016-10-05 (×25): qty 3

## 2016-10-05 MED ORDER — INFLUENZA VAC SPLIT QUAD 0.5 ML IM SUSY
0.5000 mL | PREFILLED_SYRINGE | INTRAMUSCULAR | Status: AC
  Administered 2016-10-06: 0.5 mL via INTRAMUSCULAR
  Filled 2016-10-05: qty 0.5

## 2016-10-05 MED ORDER — SODIUM CHLORIDE 0.9 % IV SOLN: 500.0000 mg | Freq: Two times a day (BID) | INTRAVENOUS | Status: DC

## 2016-10-05 NOTE — Procedures (Signed)
   11:33 AM  PATIENT:  Anne Olsen  41 y.o. female  PRE-OPERATIVE DIAGNOSIS:  Intercerebral hemorrhage with hydrocephalus  POST-OPERATIVE DIAGNOSIS:  same  PROCEDURE:  Right frontal ventriculostomy drain placement through twist drill craniotomy  SURGEON:  Marikay Alaravid Rueben Kassim, MD  ASSISTANTS: None  ANESTHESIA:   Local  EBL: 20 ml  Total I/O In: 20 [I.V.:20] Out: 50 [Urine:50]  BLOOD ADMINISTERED: none  DRAINS: Ventriculostomy  SPECIMEN:  none  INDICATION FOR PROCEDURE: This patient presented with intercerebral hemorrhage. Imaging showed progressive ventricular enlargement. Neurology has asked for ventricular drain placement . Patient's husband understood the risks, benefits, and alternatives and potential outcomes and wished to proceed.  PROCEDURE DETAILS: The right frontal region was shaved and then cleaned and then prepped with DuraPrep. The area was draped in the usual sterile fashion. I marked in the mid pupillary line and right behind the hairline. I injected 2 mL of local anesthesia and made a stab incision. I then used the twist drill to perform a craniotomy in the right frontal region. I opened the dura and then passed the ventriculostomy drain to a depth of 6 and a meters with good flow of CSF under pressure. I passed the catheter to a separate exit point and then connected it to the distal reservoir. I then sewed the catheter into position and closed the stab incision with 4-0 Ethilon. I then placed an occlusive dressing.   PLAN OF CARE: Admit to inpatient   PATIENT DISPOSITION:  ICU - intubated and critically ill.   Delay start of Pharmacological VTE agent (>24hrs) due to surgical blood loss or risk of bleeding:  yes

## 2016-10-05 NOTE — Progress Notes (Signed)
Initial Nutrition Assessment  INTERVENTION:   Recommend: Vital AF 1.2 @ 60 ml/hr (1440 ml/day) Provides: 1728 kcal, 108 grams protein, and 1167 ml H2O.   NUTRITION DIAGNOSIS:   Inadequate oral intake related to inability to eat as evidenced by NPO status.  GOAL:   Patient will meet greater than or equal to 90% of their needs  MONITOR:   Vent status, I & O's  REASON FOR ASSESSMENT:   Ventilator    ASSESSMENT:   Pt with PMH of HTN admitted with R intraparenchymal brain hemorrhage.    1/26 R frontal ventric placed, per neuro surgeon suspect poor prognosis due to brain stem involvement.   Patient is currently intubated on ventilator support MV: 8.9 L/min Temp (24hrs), Avg:100.4 F (38 C), Min:98.9 F (37.2 C), Max:101.5 F (38.6 C)  K+ 5.5  Diet Order:  Diet NPO time specified  Skin:  Reviewed, no issues  Last BM:  unknown  Height:   Ht Readings from Last 1 Encounters:  10/04/16 5\' 1"  (1.549 m)    Weight:   Wt Readings from Last 1 Encounters:  10/05/16 156 lb 15.5 oz (71.2 kg)    Ideal Body Weight:  47.7 kg  BMI:  Body mass index is 29.66 kg/m.  Estimated Nutritional Needs:   Kcal:  1754  Protein:  90-100 grams  Fluid:  > 1.7 L/day  EDUCATION NEEDS:   No education needs identified at this time  Kendell BaneHeather Burrell Hodapp RD, LDN, CNSC [phone] Pager [phone] After Hours Pager

## 2016-10-05 NOTE — Progress Notes (Signed)
PT Cancellation Note  Patient Details Name: Anne JerichoViviana Ledger MRN: [id number] DOB: [birth year]   Cancelled Treatment:    Reason Eval/Treat Not Completed: Patient not medically ready.  Pt currently on bedrest.  Please advance activity order once appropriate for PT and mobility.     Alison MurrayMegan F Laurin Morgenstern 10/05/2016, 8:39 AM

## 2016-10-05 NOTE — Care Management Note (Signed)
Case Management Note  Patient Details  Name: Anne Olsen MRN: [id number] Date of Birth: Feb 04, 1976  Subjective/Objective:   Pt admitted on 10/04/16 s/p ICH with hydrocephalus.  PTA, pt independent, lives with spouse.                 Action/Plan: IVC placed today; pt with likely poor prognosis.  Will follow/offer support.    Expected Discharge Date:                  Expected Discharge Plan:     In-House Referral:     Discharge planning Services  CM Consult  Post Acute Care Choice:    Choice offered to:     DME Arranged:    DME Agency:     HH Arranged:    HH Agency:     Status of Service:  In process, will continue to follow  If discussed at Long Length of Stay Meetings, dates discussed:    Additional Comments:  [name], RN, BSN  Trauma/Neuro ICU Case Manager [phone]

## 2016-10-05 NOTE — Progress Notes (Signed)
PULMONARY / CRITICAL CARE MEDICINE   Name: Anne JerichoViviana Olsen MRN: [id number] DOB: 1975/10/19    ADMISSION DATE:  10/04/2016  REFERRING MD:  Benjiman CoreNathan Pickering, M.D. / EDP  CHIEF COMPLAINT:  Acute Intracranial Hemorrhage  HISTORY OF PRESENT ILLNESS:  41 y.o. female pmhx of HTN who presented today via EMS for headache, weakness, and AMS. History was provided entirely by the husband GreshamMohammad. 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 He got home from work around 5 pm when she developed a sudden headache and screamed out loud. Patient told him she had never had a headache like this before. She developed weakness on her left side and became very sleepy. Husband said he tried feeding her and putting her to bed, but she became more and more lethargic and he called 911.   On arrival to the ED, blood pressure was 161/132. Pupils were noted to be unequal. She was very lethargic on non rebreather. She subsequently became unresponsive and was intubated for airway protection and started on propofol for sedation. Repeat blood pressure was 196/95. CT head revealed an acute intraparenchymal hemorrhage in the right thalamus with a hematoma measuring 6.9 mL. Per husband, patient's only other medical problems include issues with high potassium. She started taking birth control pills about 2 months ago. She exercises regularly and is otherwise healthy. Husband is unaware of hypertension or aneurysms that run in her family.   SUBJECTIVE: No acute events since admission. Patient still not waking up. Remains off sedation.  REVIEW OF SYSTEMS:  Unable to obtain given intubation and altered mental status.  VITAL SIGNS: BP 138/79   Pulse (!) 59   Temp 99.2 F (37.3 C) (Oral)   Resp (!) 26   Ht 5\' 1"  (1.549 m)   Wt 156 lb 15.5 oz (71.2 kg)   LMP  (LMP Unknown)   SpO2 100%   BMI 29.66 kg/m   HEMODYNAMICS:    VENTILATOR SETTINGS: Vent Mode: PRVC FiO2  (%):  [30 %-100 %] 30 % Set Rate:  [18 bmp-22 bmp] 22 bmp Vt Set:  [380 mL] 380 mL PEEP:  [5 cmH20-8 cmH20] 5 cmH20 Plateau Pressure:  [13 cmH20-18 cmH20] 13 cmH20  INTAKE / OUTPUT: I/O last 3 completed shifts: In: 137.4 [I.V.:87.4; IV Piggyback:50] Out: 1233 [Urine:983; Emesis/NG output:250]  PHYSICAL EXAMINATION: General:  No acute distress. Husband at bedside.  Integument:  Warm & dry. No rash on exposed skin. Callouses noted on feet.  Extremities:  No cyanosis or clubbing.  HEENT:  Moist mucus membranes. No scleral injection or icterus. Endotracheal tube in place.  Cardiovascular:  Regular rate. No edema. No appreciable JVD.  Pulmonary:  Clear with auscultation bilaterally. Symmetric chest wall rise on ventilator. Abdomen: Soft. Normal bowel sounds. Nondistended.  Musculoskeletal:  Normal bulk. No joint deformity or effusion appreciated. Neurological: Pupils slightly asymmetric with 3 mm pupil on the right and 2 mm pupil on the left. No Babinski. No spontaneous movements. Forward gaze.  LABS: CBC Latest Ref Rng & Units 10/05/2016 10/04/2016 10/04/2016  WBC 4.0 - 10.5 K/uL 18.0(H) - 9.5  Hemoglobin 12.0 - 15.0 g/dL 15.7(H) 15.6(H) 14.7  Hematocrit 36.0 - 46.0 % 46.5(H) 46.0 43.6  Platelets 150 - 400 K/uL 337 - 308   BMP Latest Ref Rng & Units 10/05/2016 10/04/2016 10/04/2016  Glucose 65 - 99 mg/dL 409(W120(H) 99 96  BUN 6 - 20 mg/dL 7 11 9   Creatinine 0.44 - 1.00 mg/dL 1.190.90  1.00 1.01(H)  Sodium 135 - 145 mmol/L 134(L) 138 134(L)  Potassium 3.5 - 5.1 mmol/L 5.5(H) 5.6(H) 5.2(H)  Chloride 101 - 111 mmol/L 107 106 107  CO2 22 - 32 mmol/L 21(L) - 20(L)  Calcium 8.9 - 10.3 mg/dL 8.3(L) - 8.3(L)     STUDIES:  CT HEAD CODE STROKE 1/25: IMPRESSION: Acute intraparenchymal hemorrhage in the right thalamus with hematoma measuring 6.9 mL. Intraventricular penetration. Small amount of subarachnoid penetration.  CTA of the neck and head do not show any atherosclerotic narrowing. The vessels  are somewhat tortuous suggesting hypertension. No sign of aneurysm or vascular malformation to explain the right thalamic hemorrhage.  PORT CXR 1/26:  Personally reviewed by me.Minimal patchy right lower lobe opacity. No other parenchymal infiltrate appreciated. No pleural effusion. Endotracheal tube in good position. Enteric feeding tube coursing below diaphragm.  MICROBIOLOGY: MRSA PCR 1/25:  Negative  ANTIBIOTICS: None  SIGNIFICANT EVENTS: 1/25 - Admit  LINES/TUBES: OETT 7.5 1/24 >> OGT 1/24 >> FOLEY 1/24 >> PIV  ASSESSMENT / PLAN:  41 y.o. female status post acute intraparenchymal hemorrhage in the right thalamus with intraventricular penetration as well as a small amount of penetration into the subarachnoid space.  1. Right intraparenchymal brain hemorrhage: Neurology consulted and following. Management per neurology recommendations. Repeat CT head pending.  2. Essential hypertension: Blood pressure control per neurology recommendations for systolic blood pressure greater than 150. 3. Right lower lobe opacity: Suspect aspiration pneumonia. Starting empiric Unasyn. 4. Hyperkalemia:  Chronic issue for the patient. Repeating BMP @ 1700 today. Continuous telemetry monitoring. 5. Leukocytosis:  Likely reactive. Trending daily with cell counts.  6. Prophylaxis: SCDs & Pepcid IV every 12 hours. 7. Diet: Nothing by mouth. Holding off on tube feedings for now.  I have spent a total of 32 minutes of additional critical care time today caring for the patient and reviewing the patient's electronic medical record.   Donna Christen Jamison Neighbor, M.D. [facility] Pager:  [phone] After 3pm or if no response, call [phone] 10/05/2016 8:29 AM

## 2016-10-05 NOTE — Consult Note (Signed)
Reason for Consult:ICH/ HCP Referring Physician: Neurology  Anne Olsen is an 41 y.o. female.   HPI:  41 year old female with a history of hypertension who presented to the emergency department yesterday with an intracerebral hemorrhage. She has had a declining mental status to the point that she is only breathing above the ventilator. She has no corneal reflexes or gag reflex or response to painful stimuli. The neurologist repeated her head CT scan today and it shows progressive ventricular enlargement to now a moderate size. They have asked if she would be a candidate for ventricular drain placement. The patient is intubated and is a Glasgow Coma Score of 3 and therefore is unable to assist with history and physical  Past Medical History:  Diagnosis Date  . Hyperkalemia   . Hypertension     History reviewed. No pertinent surgical history.  No Known Allergies  Social History  Substance Use Topics  . Smoking status: Never Smoker  . Smokeless tobacco: Never Used  . Alcohol use Not on file    History reviewed. No pertinent family history.   Review of Systems  Positive ROS: Unable to obtain  All other systems have been reviewed and were otherwise negative with the exception of those mentioned in the HPI and as above.  Objective: Vital signs in last 24 hours: Temp:  [98.9 F (37.2 C)-101.1 F (38.4 C)] 99.2 F (37.3 C) (01/26 0800) Pulse Rate:  [52-105] 60 (01/26 0941) Resp:  [12-26] 24 (01/26 0941) BP: (131-196)/(74-132) 138/79 (01/26 0800) SpO2:  [95 %-100 %] 100 % (01/26 0941) FiO2 (%):  [30 %-100 %] 30 % (01/26 0941) Weight:  [70 kg (154 lb 5.2 oz)-71.2 kg (156 lb 15.5 oz)] 71.2 kg (156 lb 15.5 oz) (01/26 0500)  General Appearance: GCS 3, young female intubated Head: Normocephalic, without obvious abnormality, atraumatic Eyes: Right pupil 3 mm and unreactive, left pupil 2 mm and unreactive      Ears: Normal TM's and external ear canals, both ears Throat:  Intubated Neck: Supple  Lungs:  respirations unlabored Heart: Regular rate and rhythm Abdomen: Soft  NEUROLOGIC:   Mental status: GCS 3 Motor Exam - no response to painful stimuli Sensory Exam - unable to assess Reflexes: symmetric Coordination - unable to assess Gait - unable to assess Balance - unable to assess Cranial Nerves: I: smell Not tested  II: visual acuity  OS: na    OD: na  II: visual fields na  II: pupils As above   III,VII: ptosis   III,IV,VI: extraocular muscles    V: mastication   V: facial light touch sensation    V,VII: corneal reflex  Absent   VII: facial muscle function - upper    VII: facial muscle function - lower   VIII: hearing   IX: soft palate elevation    IX,X: gag reflex Absent   XI: trapezius strength    XI: sternocleidomastoid strength   XI: neck flexion strength    XII: tongue strength      Data Review Lab Results  Component Value Date   WBC 18.0 (H) 10/05/2016   HGB 15.7 (H) 10/05/2016   HCT 46.5 (H) 10/05/2016   MCV 88.2 10/05/2016   PLT 337 10/05/2016   Lab Results  Component Value Date   NA 134 (L) 10/05/2016   K 5.5 (H) 10/05/2016   CL 107 10/05/2016   CO2 21 (L) 10/05/2016   BUN 7 10/05/2016   CREATININE 0.90 10/05/2016   GLUCOSE 120 (H) 10/05/2016  Lab Results  Component Value Date   INR 0.94 10/04/2016    Radiology: Ct Angio Head W Or Wo Contrast  Result Date: 10/04/2016 CLINICAL DATA:  Code stroke. EXAM: CT HEAD WITHOUT CONTRAST CT ANGIOGRAPHY OF THE HEAD AND NECK TECHNIQUE: Contiguous axial images were obtained from the base of the skull through the vertex without intravenous contrast. Multidetector CT imaging of the head and neck was performed using the standard protocol during bolus administration of intravenous contrast. Multiplanar CT image reconstructions and MIPs were obtained to evaluate the vascular anatomy. Carotid stenosis measurements (when applicable) are obtained utilizing NASCET criteria, using the  distal internal carotid diameter as the denominator. CONTRAST:  50 cc Isovue 370 COMPARISON:  None FINDINGS: CT HEAD Brain: There is an acute intraparenchymal hemorrhage in the right thalamus with intraventricular and subarachnoid penetration. Intraparenchymal hematoma in the right thalamus measures 2.6 x 2.2 x 2.3 cm (volume = 6.9 cm^3) blood is present in the lateral ventricles, third ventricle and filling the fourth ventricle. No other parenchymal brain finding. No conclusive hydrocephalus at this moment. Vascular: No primary vascular finding. Skull: Normal Sinuses/Orbits: Small amount of fluid layering in the left maxillary sinuses. Others clear. Orbits negative. CTA NECK Aortic arch: Aortic arch is normal.  Branching pattern is normal. Right carotid system: Right common carotid artery widely patent to the bifurcation. No atherosclerotic change of the bifurcation. Cervical internal carotid artery is normal. Left carotid system: Common carotid artery widely patent to the bifurcation. No bifurcation atherosclerosis. Cervical internal carotid artery is normal. Vertebral arteries:Both vertebral arteries widely patent at their origins and through the neck. Skeleton: Negative Other neck: None significant CTA HEAD Anterior circulation: Both internal carotid artery's widely patent through the skullbase and siphon regions. Anterior and middle cerebral vessels are patent without proximal stenosis, aneurysm or vascular malformation. Posterior circulation: Both vertebral arteries widely patent to the basilar. No basilar stenosis. Posterior circulation branch vessels are normal. Venous sinuses: Patent and normal Anatomic variants: None significant Delayed phase: No enhancement IMPRESSION: Acute intraparenchymal hemorrhage in the right thalamus with hematoma measuring 6.9 mL. Intraventricular penetration. Small amount of subarachnoid penetration. CTA of the neck and head do not show any atherosclerotic narrowing. The vessels  are somewhat tortuous suggesting hypertension. No sign of aneurysm or vascular malformation to explain the right thalamic hemorrhage. Critical Value/emergent results were called by telephone at the time of interpretation on 10/04/2016 at 8:11 pm to Dr. Benjiman Core , who verbally acknowledged these results. Electronically Signed   By: Paulina Fusi M.D.   On: 10/04/2016 20:13   Ct Head Wo Contrast  Result Date: 10/05/2016 CLINICAL DATA:  Intra cerebral hemorrhage.  Hypertension. EXAM: CT HEAD WITHOUT CONTRAST TECHNIQUE: Contiguous axial images were obtained from the base of the skull through the vertex without intravenous contrast. COMPARISON:  CT 10/04/2016 FINDINGS: Brain: Right thalamic hemorrhage appears smaller today. Hemorrhage has ruptured into the subarachnoid space and intraventricular space as noted previously. There is now blood layering in the occipital horns. No new area of hemorrhage. Progressive hydrocephalus. Moderate ventricular dilatation is now present. Mild midline shift to the left involving the thalamus. No acute infarct or mass. Vascular: No hyperdense vessel or unexpected calcification. Skull: Negative Sinuses/Orbits: Air-fluid levels in the left maxillary and left sphenoid sinus more prominent today. Other: None IMPRESSION: Decrease in volume of right thalamic hemorrhage. Intraventricular and subarachnoid hemorrhage again noted. Progressive hydrocephalus. These results will be called to the ordering clinician or representative by the Radiologist Assistant, and communication documented in  the PACS or zVision Dashboard. Electronically Signed   By: Marlan Palau M.D.   On: 10/05/2016 09:47   Ct Angio Neck W And/or Wo Contrast  Result Date: 10/04/2016 CLINICAL DATA:  Code stroke. EXAM: CT HEAD WITHOUT CONTRAST CT ANGIOGRAPHY OF THE HEAD AND NECK TECHNIQUE: Contiguous axial images were obtained from the base of the skull through the vertex without intravenous contrast. Multidetector CT  imaging of the head and neck was performed using the standard protocol during bolus administration of intravenous contrast. Multiplanar CT image reconstructions and MIPs were obtained to evaluate the vascular anatomy. Carotid stenosis measurements (when applicable) are obtained utilizing NASCET criteria, using the distal internal carotid diameter as the denominator. CONTRAST:  50 cc Isovue 370 COMPARISON:  None FINDINGS: CT HEAD Brain: There is an acute intraparenchymal hemorrhage in the right thalamus with intraventricular and subarachnoid penetration. Intraparenchymal hematoma in the right thalamus measures 2.6 x 2.2 x 2.3 cm (volume = 6.9 cm^3) blood is present in the lateral ventricles, third ventricle and filling the fourth ventricle. No other parenchymal brain finding. No conclusive hydrocephalus at this moment. Vascular: No primary vascular finding. Skull: Normal Sinuses/Orbits: Small amount of fluid layering in the left maxillary sinuses. Others clear. Orbits negative. CTA NECK Aortic arch: Aortic arch is normal.  Branching pattern is normal. Right carotid system: Right common carotid artery widely patent to the bifurcation. No atherosclerotic change of the bifurcation. Cervical internal carotid artery is normal. Left carotid system: Common carotid artery widely patent to the bifurcation. No bifurcation atherosclerosis. Cervical internal carotid artery is normal. Vertebral arteries:Both vertebral arteries widely patent at their origins and through the neck. Skeleton: Negative Other neck: None significant CTA HEAD Anterior circulation: Both internal carotid artery's widely patent through the skullbase and siphon regions. Anterior and middle cerebral vessels are patent without proximal stenosis, aneurysm or vascular malformation. Posterior circulation: Both vertebral arteries widely patent to the basilar. No basilar stenosis. Posterior circulation branch vessels are normal. Venous sinuses: Patent and normal  Anatomic variants: None significant Delayed phase: No enhancement IMPRESSION: Acute intraparenchymal hemorrhage in the right thalamus with hematoma measuring 6.9 mL. Intraventricular penetration. Small amount of subarachnoid penetration. CTA of the neck and head do not show any atherosclerotic narrowing. The vessels are somewhat tortuous suggesting hypertension. No sign of aneurysm or vascular malformation to explain the right thalamic hemorrhage. Critical Value/emergent results were called by telephone at the time of interpretation on 10/04/2016 at 8:11 pm to Dr. Benjiman Core , who verbally acknowledged these results. Electronically Signed   By: Paulina Fusi M.D.   On: 10/04/2016 20:13   Dg Chest Port 1 View  Result Date: 10/05/2016 CLINICAL DATA:  Intubated last night, intracranial hemorrhage EXAM: PORTABLE CHEST 1 VIEW COMPARISON:  10/04/2016 FINDINGS: Cardiomediastinal silhouette is stable. No infiltrate or pulmonary edema. Endotracheal tube stable in position with tip 2.1 cm above the carina. Stable NG tube position. IMPRESSION: No active disease.  Stable support apparatus. Electronically Signed   By: Natasha Mead M.D.   On: 10/05/2016 09:17   Dg Chest Portable 1 View  Result Date: 10/04/2016 CLINICAL DATA:  Endotracheal tube and orogastric tube placement. Initial encounter. EXAM: PORTABLE CHEST 1 VIEW COMPARISON:  None. FINDINGS: The patient's endotracheal tube is seen ending 2 cm above the carina. An enteric tube is noted ending overlying the body of the stomach, with the side port about the gastroesophageal junction. Vascular congestion is noted. Mild patchy left-sided airspace opacity may reflect mild interstitial edema or atelectasis. No pleural  effusion or pneumothorax is seen. The cardiomediastinal silhouette is mildly enlarged. No acute osseous abnormalities are identified. IMPRESSION: 1. Endotracheal tube seen ending 2 cm above the carina. 2. Enteric tube noted ending overlying the body of the  stomach, with the side port about the gastroesophageal junction. 3. Vascular congestion and mild cardiomegaly. Mild patchy left-sided airspace opacity may reflect atelectasis or mild interstitial edema. Electronically Signed   By: Roanna Raider M.D.   On: 10/04/2016 20:43   Ct Head Code Stroke W/o Cm  Result Date: 10/04/2016 CLINICAL DATA:  Code stroke. EXAM: CT HEAD WITHOUT CONTRAST CT ANGIOGRAPHY OF THE HEAD AND NECK TECHNIQUE: Contiguous axial images were obtained from the base of the skull through the vertex without intravenous contrast. Multidetector CT imaging of the head and neck was performed using the standard protocol during bolus administration of intravenous contrast. Multiplanar CT image reconstructions and MIPs were obtained to evaluate the vascular anatomy. Carotid stenosis measurements (when applicable) are obtained utilizing NASCET criteria, using the distal internal carotid diameter as the denominator. CONTRAST:  50 cc Isovue 370 COMPARISON:  None FINDINGS: CT HEAD Brain: There is an acute intraparenchymal hemorrhage in the right thalamus with intraventricular and subarachnoid penetration. Intraparenchymal hematoma in the right thalamus measures 2.6 x 2.2 x 2.3 cm (volume = 6.9 cm^3) blood is present in the lateral ventricles, third ventricle and filling the fourth ventricle. No other parenchymal brain finding. No conclusive hydrocephalus at this moment. Vascular: No primary vascular finding. Skull: Normal Sinuses/Orbits: Small amount of fluid layering in the left maxillary sinuses. Others clear. Orbits negative. CTA NECK Aortic arch: Aortic arch is normal.  Branching pattern is normal. Right carotid system: Right common carotid artery widely patent to the bifurcation. No atherosclerotic change of the bifurcation. Cervical internal carotid artery is normal. Left carotid system: Common carotid artery widely patent to the bifurcation. No bifurcation atherosclerosis. Cervical internal carotid  artery is normal. Vertebral arteries:Both vertebral arteries widely patent at their origins and through the neck. Skeleton: Negative Other neck: None significant CTA HEAD Anterior circulation: Both internal carotid artery's widely patent through the skullbase and siphon regions. Anterior and middle cerebral vessels are patent without proximal stenosis, aneurysm or vascular malformation. Posterior circulation: Both vertebral arteries widely patent to the basilar. No basilar stenosis. Posterior circulation branch vessels are normal. Venous sinuses: Patent and normal Anatomic variants: None significant Delayed phase: No enhancement IMPRESSION: Acute intraparenchymal hemorrhage in the right thalamus with hematoma measuring 6.9 mL. Intraventricular penetration. Small amount of subarachnoid penetration. CTA of the neck and head do not show any atherosclerotic narrowing. The vessels are somewhat tortuous suggesting hypertension. No sign of aneurysm or vascular malformation to explain the right thalamic hemorrhage. Critical Value/emergent results were called by telephone at the time of interpretation on 10/04/2016 at 8:11 pm to Dr. Benjiman Core , who verbally acknowledged these results. Electronically Signed   By: Paulina Fusi M.D.   On: 10/04/2016 20:13     Assessment/Plan: Very difficult situation and a 41 year old with a deep brain hemorrhage with progressive hydrocephalus but a poor neurologic exam and this would suggest a poor prognosis at this point, despite aggressive measures. He only has no response except spontaneous breathing but no other brainstem reflexes and no response to painful stimuli. I have spoken to the neurologist at length. I have spoken to her husband at length. The neurologist is asking whether or not she is a candidate for ventriculostomy drain placement.   Unfortunately I think most of her deterioration has  been related to ischemia to the brainstem. However hydrocephalus has to be  playing some role in this. She is [id number] and the husband would like to be aggressive. He understands the placement of the ventricular drain will only treat the neurologic deterioration related to the hydrocephalus and one not necessarily treat the ischemic injury to the brainstem.  The husband would like to move forward with ventriculostomy drain placement. I have explained the procedure and have explained that the risk include but are not limited to bleeding, infection, brain hemorrhage, stroke, numbness, weakness, paralysis, loss of vision, lack of relief of symptoms, worsening symptoms, and death. He agrees to proceed. Informed consent has been obtained.   Anne Olsen S 10/05/2016 11:25 AM

## 2016-10-05 NOTE — Progress Notes (Signed)
OT Cancellation Note  Patient Details Name: Ricardo JerichoViviana Pelphrey MRN: [id number] DOB: Jul 05, 1976   Cancelled Treatment:    Reason Eval/Treat Not Completed: Medical issues which prohibited therapy.  Pt is intubated and unresponsive.  OT not appropriate at this time and will sign off.  If she becomesmedically  appropriate for OT, please re-order.     Shunna Mikaelian [place], OTR/L [phone]  [name], Delton Stelle M 10/05/2016, 12:57 PM

## 2016-10-05 NOTE — Progress Notes (Signed)
Patient transported to CT and back to room 3M14 without complications.   

## 2016-10-05 NOTE — Progress Notes (Signed)
SLP Cancellation Note  Patient Details Name: Anne JerichoViviana Graham MRN: [id number] DOB: 21-Mar-1976   Cancelled treatment:       Reason Eval/Treat Not Completed: Patient not medically ready, currently intubated. Per discussion with RN, will sign off. Please re-order if needed.   Maxcine Hamaiewonsky, Keri Veale 10/05/2016, 9:13 AM  Maxcine HamLaura Paiewonsky, M.A. CCC-SLP [phone]

## 2016-10-05 NOTE — Progress Notes (Signed)
PT Cancellation and Discharge Note  Patient Details Name: Ricardo JerichoViviana Gironda MRN: [id number] DOB: July 03, 1976   Cancelled Treatment:    Reason Eval/Treat Not Completed: Other (comment)  Will sign off PT at this time and need new order if pt becomes appropriate for PT and mobility.     Alison MurrayMegan F Akera Snowberger 10/05/2016, 10:43 AM

## 2016-10-05 NOTE — Progress Notes (Signed)
STROKE TEAM PROGRESS NOTE   HISTORY OF PRESENT ILLNESS (per record) Anne JerichoViviana Olsen is an 41 y.o. female with a history of hypertension brought to the emergency room following acute onset of headache which was followed by nausea and projectile vomiting and subsequent loss of consciousness. Patient was intubated for airway protection after arriving in the emergency room. Blood pressure was 161/132 on arrival in the ED. She was started on propofol for sedation. Repeat blood pressure was 196/95. No seizure activity was reported. Patient spouse indicated patient did not take antihypertensive medication today. The scan of her head showed acute parenchymal hemorrhage involving right thalamus and brainstem, with extension to the fourth ventricle as well as subarachnoid extension. CT angiogram of the head and neck was unremarkable with no signs of aneurysm or AVM. ICH score was 3.  LSN: 6:22 PM on 10/04/2016 tPA Given: No: Acute ICH mRankin:   SUBJECTIVE (INTERVAL HISTORY) The patient's husband is at the bedside. The patient is intubated but not sedated. Dr. Pearlean BrownieSethi discussed the patient's case with the husband and informed him that the outlook was grim. The case was also discussed with neurosurgery - Dr. Yetta BarreJones.   OBJECTIVE Temp:  [98.9 F (37.2 C)-101.1 F (38.4 C)] 100.8 F (38.2 C) (01/26 0400) Pulse Rate:  [52-105] 58 (01/26 0700) Cardiac Rhythm: Sinus bradycardia (01/26 0400) Resp:  [12-26] 26 (01/26 0700) BP: (131-196)/(74-132) 178/79 (01/26 0700) SpO2:  [95 %-100 %] 100 % (01/26 0700) FiO2 (%):  [30 %-100 %] 30 % (01/26 0400) Weight:  [70 kg (154 lb 5.2 oz)-71.2 kg (156 lb 15.5 oz)] 71.2 kg (156 lb 15.5 oz) (01/26 0500)  CBC:  Recent Labs Lab 10/04/16 1925 10/04/16 1928 10/05/16 0350  WBC 9.5  --  18.0*  NEUTROABS 5.5  --   --   HGB 14.7 15.6* 15.7*  HCT 43.6 46.0 46.5*  MCV 88.3  --  88.2  PLT 308  --  337    Basic Metabolic Panel:  Recent Labs Lab 10/04/16 1925  10/04/16 1928 10/05/16 0350  NA 134* 138 134*  K 5.2* 5.6* 5.5*  CL 107 106 107  CO2 20*  --  21*  GLUCOSE 96 99 120*  BUN 9 11 7   CREATININE 1.01* 1.00 0.90  CALCIUM 8.3*  --  8.3*    Lipid Panel:    Component Value Date/Time   TRIG 96 10/04/2016 1925   HgbA1c: No results found for: HGBA1C Urine Drug Screen:    Component Value Date/Time   LABOPIA NONE DETECTED 10/04/2016 2054   COCAINSCRNUR NONE DETECTED 10/04/2016 2054   LABBENZ NONE DETECTED 10/04/2016 2054   AMPHETMU NONE DETECTED 10/04/2016 2054   THCU NONE DETECTED 10/04/2016 2054   LABBARB NONE DETECTED 10/04/2016 2054      IMAGING  Ct Angio Head and Neck W Or Wo Contrast 10/04/2016 Acute intraparenchymal hemorrhage in the right thalamus with hematoma measuring 6.9 mL. Intraventricular penetration. Small amount of subarachnoid penetration. CTA of the neck and head do not show any atherosclerotic narrowing. The vessels are somewhat tortuous suggesting hypertension. No sign of aneurysm or vascular malformation to explain the right thalamic hemorrhage.    Dg Chest Portable 1 View 10/04/2016 1. Endotracheal tube seen ending 2 cm above the carina.  2. Enteric tube noted ending overlying the body of the stomach, with the side port about the gastroesophageal junction.  3. Vascular congestion and mild cardiomegaly. Mild patchy left-sided airspace opacity may reflect atelectasis or mild interstitial edema.    Ct Head  Code Stroke W/o Cm 10/04/2016 Acute intraparenchymal hemorrhage in the right thalamus with hematoma measuring 6.9 mL. Intraventricular penetration. Small amount of subarachnoid penetration. CTA of the neck and head do not show any atherosclerotic narrowing. The vessels are somewhat tortuous suggesting hypertension. No sign of aneurysm or vascular malformation to explain the right thalamic hemorrhage.    Ct Head W/o Cm 10/05/2016 Decrease in volume of right thalamic hemorrhage. Intraventricular and  subarachnoid hemorrhage again noted. Progressive hydrocephalus.    PHYSICAL EXAM Young hispanic lady whio is intubated but not sedated. . Afebrile. Head is nontraumatic. Neck is supple without bruit.    Cardiac exam no murmur or gallop. Lungs are clear to auscultation. Distal pulses are well felt. . Afebrile. Head is nontraumatic. Neck is supple without bruit.    Cardiac exam no murmur or gallop. Lungs are clear to auscultation. Distal pulses are well felt.  Neurological Exam :  Patient is intubated. Comatose. Unresponsive. Slight skew eye deviation with right eye lower. Right pupil 3 mm and minimally reactive. Left pupil 2 mm and not reactive. Corneal reflexes are absent bilaterally. Dolls eye moments are absent. Patient has a weak cough and gag. She has minimum ventilatory effort above the ventilator. No response to sternal rub. Minimum nonpurposeful withdrawal of the right lower extremity to painful stimuli. No response to motor stimulation in either of the other extremities. Deep tendon pulses are depressed. Plantars are equivocal.     ASSESSMENT/PLAN [name] is a 41 y.o. female with history of hypertension and hypokalemia presenting with headache, elevated blood pressure, nausea, projectile vomiting, and loss of consciousness.  She did not receive IV t-PA due to acute intraparenchymal hemorrhage.    Hemorrhagic Stroke:  Non-dominant secondary to hypertension.   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  MRA - not performed.  CTA H&N - intraparenchymal hemorrhage in the Rt. thalamus with a small amount of subarachnoid penetration  Carotid Doppler - CTA neck  2D Echo - not indicated.  LDL - not indicated  HgbA1c - not indicated  VTE prophylaxis - SCDs  Diet NPO time specified  No antithrombotic prior to admission, now on No antithrombotic secondary to hemorrhage.  Ongoing aggressive stroke risk factor  management  Therapy recommendations: pending  Disposition: Pending  Hypertension  Stable  Long-term BP goal normotensive   Other Stroke Risk Factors  Obesity, Body mass index is 29.66 kg/m., recommend weight loss, diet and exercise as appropriate   Medical noncompliance    Other Active Problems  Hyperkalemia - 5.5 - monitor  Fever - 101.5 axillary. WBCs - 18.0 ; (Unasyn started Friday, 10/05/2016)  Neurosurgery consult -> ventriculostomy drain placement scheduled.    Hospital day # 1  Delton See PA-C [facility] Pager [phone] 10/05/2016, 2:30 PM  I have personally examined this patient, reviewed notes, independently viewed imaging studies, participated in medical decision making and plan of care.ROS completed by me personally and pertinent positives fully documented  I have made any additions or clarifications directly to the above note. Agree with note above. This patient unfortunately presented with a massive brainstem hemorrhage with a very poor neurological exam with survival extremely unlikely. I had a long discussion with the patient's husband held the bedside as well as with Dr. Marikay Alar neurosurgeon. We repeated a stat CT scan of the head which shows some worsening hydrocephalus but massive edema of the brainstem as well as complete filling of the fourth ventricle.  The patient's chances of survival without prolonged ventilatory support and having any meaningful quality of life are quite slim. The patient's husband understands but would like to support her for a short time. I spent him that the patient mentioned and up becoming winded which may be imminent. Continue supportive care for now. This patient is critically ill and at significant risk of neurological worsening, death and care requires constant monitoring of vital signs, hemodynamics,respiratory and cardiac monitoring, extensive review of multiple databases, frequent neurological  assessment, discussion with family, other specialists and medical decision making of high complexity.I have made any additions or clarifications directly to the above note.This critical care time does not reflect procedure time, or teaching time or supervisory time of PA/NP/Med Resident etc but could involve care discussion time.  I spent 50 minutes of neurocritical care time  in the care of  this patient.      Delia Heady, MD Medical Director [facility] Pager: [phone] 10/05/2016 2:57 PM To contact Stroke Continuity provider, please refer to [email or web address]. After hours, contact General Neurology

## 2016-10-06 ENCOUNTER — Inpatient Hospital Stay (HOSPITAL_COMMUNITY): Payer: 59

## 2016-10-06 DIAGNOSIS — J9601 Acute respiratory failure with hypoxia: Secondary | ICD-10-CM

## 2016-10-06 DIAGNOSIS — D72829 Elevated white blood cell count, unspecified: Secondary | ICD-10-CM

## 2016-10-06 DIAGNOSIS — G911 Obstructive hydrocephalus: Secondary | ICD-10-CM

## 2016-10-06 LAB — CBC WITH DIFFERENTIAL/PLATELET
BASOS PCT: 0 %
Basophils Absolute: 0 10*3/uL (ref 0.0–0.1)
Basophils Absolute: 0 10*3/uL (ref 0.0–0.1)
Basophils Relative: 0 %
EOS PCT: 0 %
Eosinophils Absolute: 0 10*3/uL (ref 0.0–0.7)
Eosinophils Absolute: 0 10*3/uL (ref 0.0–0.7)
Eosinophils Relative: 0 %
HEMATOCRIT: 43.2 % (ref 36.0–46.0)
HEMATOCRIT: 43.2 % (ref 36.0–46.0)
HEMOGLOBIN: 14.6 g/dL (ref 12.0–15.0)
Hemoglobin: 14.3 g/dL (ref 12.0–15.0)
LYMPHS ABS: 1.5 10*3/uL (ref 0.7–4.0)
LYMPHS PCT: 9 %
Lymphocytes Relative: 12 %
Lymphs Abs: 1.9 10*3/uL (ref 0.7–4.0)
MCH: 29.8 pg (ref 26.0–34.0)
MCH: 29.7 pg (ref 26.0–34.0)
MCHC: 33.8 g/dL (ref 30.0–36.0)
MCHC: 33.1 g/dL (ref 30.0–36.0)
MCV: 88.2 fL (ref 78.0–100.0)
MCV: 89.6 fL (ref 78.0–100.0)
MONOS PCT: 10 %
Monocytes Absolute: 1.7 10*3/uL — ABNORMAL HIGH (ref 0.1–1.0)
Monocytes Absolute: 1.8 10*3/uL — ABNORMAL HIGH (ref 0.1–1.0)
Monocytes Relative: 11 %
NEUTROS ABS: 13.5 10*3/uL — AB (ref 1.7–7.7)
NEUTROS PCT: 81 %
NEUTROS PCT: 77 %
Neutro Abs: 12.5 10*3/uL — ABNORMAL HIGH (ref 1.7–7.7)
Platelets: 250 10*3/uL (ref 150–400)
Platelets: 250 10*3/uL (ref 150–400)
RBC: 4.9 MIL/uL (ref 3.87–5.11)
RBC: 4.82 MIL/uL (ref 3.87–5.11)
RDW: 12.7 % (ref 11.5–15.5)
RDW: 13 % (ref 11.5–15.5)
WBC: 16.7 10*3/uL — ABNORMAL HIGH (ref 4.0–10.5)
WBC: 16.2 10*3/uL — AB (ref 4.0–10.5)

## 2016-10-06 LAB — RENAL FUNCTION PANEL
ANION GAP: 9 (ref 5–15)
Albumin: 2.7 g/dL — ABNORMAL LOW (ref 3.5–5.0)
BUN: 11 mg/dL (ref 6–20)
CHLORIDE: 107 mmol/L (ref 101–111)
CO2: 20 mmol/L — AB (ref 22–32)
Calcium: 8.4 mg/dL — ABNORMAL LOW (ref 8.9–10.3)
Creatinine, Ser: 1.06 mg/dL — ABNORMAL HIGH (ref 0.44–1.00)
GFR calc Af Amer: >60 mL/min (ref >60)
GFR calc non Af Amer: >60 mL/min (ref >60)
GLUCOSE: 102 mg/dL — AB (ref 65–99)
PHOSPHORUS: 2.8 mg/dL (ref 2.5–4.6)
POTASSIUM: 4.6 mmol/L (ref 3.5–5.1)
Sodium: 136 mmol/L (ref 135–145)

## 2016-10-06 LAB — VITAMIN B12: Vitamin B-12: 718 pg/mL (ref 180–914)

## 2016-10-06 LAB — ECHOCARDIOGRAM COMPLETE
HEIGHTINCHES: 61 in
WEIGHTICAEL: 2423.3 [oz_av]

## 2016-10-06 LAB — LIPID PANEL
CHOLESTEROL: 182 mg/dL (ref 0–200)
HDL: 63 mg/dL (ref >40)
LDL Cholesterol: 103 mg/dL — ABNORMAL HIGH (ref 0–99)
TRIGLYCERIDES: 82 mg/dL (ref <150)
Total CHOL/HDL Ratio: 2.9 RATIO
VLDL: 16 mg/dL (ref 0–40)

## 2016-10-06 LAB — TSH: TSH: 0.285 u[IU]/mL — ABNORMAL LOW (ref 0.350–4.500)

## 2016-10-06 LAB — MAGNESIUM: Magnesium: 2.1 mg/dL (ref 1.7–2.4)

## 2016-10-06 MED ORDER — BISACODYL 10 MG RE SUPP
10.0000 mg | Freq: Every day | RECTAL | Status: DC | PRN
  Administered 2016-10-13: 10 mg via RECTAL
  Filled 2016-10-06: qty 1

## 2016-10-06 MED ORDER — FENTANYL CITRATE (PF) 100 MCG/2ML IJ SOLN
100.0000 ug | INTRAMUSCULAR | Status: AC | PRN
  Administered 2016-10-07 – 2016-10-10 (×3): 100 ug via INTRAVENOUS
  Filled 2016-10-06 (×4): qty 2

## 2016-10-06 MED ORDER — SODIUM CHLORIDE 0.9 % IV SOLN
INTRAVENOUS | Status: DC
  Administered 2016-10-06 – 2016-10-26 (×9): via INTRAVENOUS

## 2016-10-06 MED ORDER — LISINOPRIL 20 MG PO TABS
20.0000 mg | ORAL_TABLET | Freq: Every day | ORAL | Status: DC
  Administered 2016-10-06 – 2016-10-16 (×10): 20 mg via ORAL
  Filled 2016-10-06 (×11): qty 1

## 2016-10-06 MED ORDER — FENTANYL CITRATE (PF) 100 MCG/2ML IJ SOLN
100.0000 ug | INTRAMUSCULAR | Status: DC | PRN
  Administered 2016-10-07 – 2016-10-10 (×5): 100 ug via INTRAVENOUS
  Filled 2016-10-06 (×6): qty 2

## 2016-10-06 NOTE — Progress Notes (Signed)
STROKE TEAM PROGRESS NOTE   SUBJECTIVE (INTERVAL HISTORY) Pt husband at bedside. Pt intubated on no sedation. She is in deep coma not responsive to pain or voice except inconsistent mild right foot DF on pain. EVD patent, 15-20cc/h drainage.    OBJECTIVE Temp:  [98 F (36.7 C)-101.5 F (38.6 C)] 101.5 F (38.6 C) (01/27 0758) Pulse Rate:  [51-70] 59 (01/27 0900) Cardiac Rhythm: Sinus bradycardia (01/27 0800) Resp:  [19-25] 24 (01/27 0900) BP: (132-154)/(75-92) 134/79 (01/27 0900) SpO2:  [99 %-100 %] 99 % (01/27 0929) FiO2 (%):  [30 %] 30 % (01/27 0929) Weight:  [68.7 kg (151 lb 7.3 oz)] 68.7 kg (151 lb 7.3 oz) (01/27 0600)  CBC:  Recent Labs Lab 10/04/16 1925  10/05/16 0350 10/06/16 0413  WBC 9.5  --  18.0* 16.7*  NEUTROABS 5.5  --   --  13.5*  HGB 14.7  < > 15.7* 14.6  HCT 43.6  < > 46.5* 43.2  MCV 88.3  --  88.2 88.2  PLT 308  --  337 250  < > = values in this interval not displayed.  Basic Metabolic Panel:   Recent Labs Lab 10/05/16 1633 10/06/16 0413  NA 135 136  K 5.0 4.6  CL 108 107  CO2 22 20*  GLUCOSE 112* 102*  BUN 8 11  CREATININE 0.97 1.06*  CALCIUM 8.3* 8.4*  MG  --  2.1  PHOS  --  2.8    Lipid Panel:     Component Value Date/Time   CHOL 182 10/06/2016 0413   TRIG 82 10/06/2016 0413   HDL 63 10/06/2016 0413   CHOLHDL 2.9 10/06/2016 0413   VLDL 16 10/06/2016 0413   LDLCALC 103 (H) 10/06/2016 0413   HgbA1c: No results found for: HGBA1C Urine Drug Screen:     Component Value Date/Time   LABOPIA NONE DETECTED 10/04/2016 2054   COCAINSCRNUR NONE DETECTED 10/04/2016 2054   LABBENZ NONE DETECTED 10/04/2016 2054   AMPHETMU NONE DETECTED 10/04/2016 2054   THCU NONE DETECTED 10/04/2016 2054   LABBARB NONE DETECTED 10/04/2016 2054      IMAGING I have personally reviewed the radiological images below and agree with the radiology interpretations.  Ct Angio Head and Neck W Or Wo Contrast 10/04/2016 Acute intraparenchymal hemorrhage in the  right thalamus with hematoma measuring 6.9 mL. Intraventricular penetration. Small amount of subarachnoid penetration. CTA of the neck and head do not show any atherosclerotic narrowing. The vessels are somewhat tortuous suggesting hypertension. No sign of aneurysm or vascular malformation to explain the right thalamic hemorrhage.   Dg Chest Portable 1 View 10/04/2016 1. Endotracheal tube seen ending 2 cm above the carina.  2. Enteric tube noted ending overlying the body of the stomach, with the side port about the gastroesophageal junction.  3. Vascular congestion and mild cardiomegaly. Mild patchy left-sided airspace opacity may reflect atelectasis or mild interstitial edema.  Ct Head Code Stroke W/o Cm 10/04/2016 Acute intraparenchymal hemorrhage in the right thalamus with hematoma measuring 6.9 mL. Intraventricular penetration. Small amount of subarachnoid penetration. CTA of the neck and head do not show any atherosclerotic narrowing. The vessels are somewhat tortuous suggesting hypertension. No sign of aneurysm or vascular malformation to explain the right thalamic hemorrhage.   Ct Head W/o Cm 10/05/2016 Decrease in volume of right thalamic hemorrhage. Intraventricular and subarachnoid hemorrhage again noted. Progressive hydrocephalus.  TTE pending  CT head pending   PHYSICAL EXAM  Temp:  [98 F (36.7 C)-101.7 F (38.7 C)] 101.7  F (38.7 C) (01/27 1200) Pulse Rate:  [54-66] 63 (01/27 1400) Resp:  [21-25] 21 (01/27 1400) BP: (132-154)/(75-92) 142/87 (01/27 1400) SpO2:  [99 %-100 %] 100 % (01/27 1400) FiO2 (%):  [30 %] 30 % (01/27 1400) Weight:  [151 lb 7.3 oz (68.7 kg)] 151 lb 7.3 oz (68.7 kg) (01/27 0600)  General - Well nourished, well developed, intubated, not on sedation, in coma.  Ophthalmologic - Fundi not visualized.  Cardiovascular - Regular rate and rhythm.  Neuro - intubated, not on sedation, not responsive, in coma. Pupils 2.415mm bilaterally, no pupillary reflex,  no corneal reflexes bilaterally, no doll's eyes, eyes mid position. Weak gag and cough, breathing over the vent. On pain stimulation, no movement on BUEs and LLE, only inconsistent mild DF at RLE, no babinski, DTR diminished. Sensation, coordination and gait not able to test.    ASSESSMENT/PLAN Ms. Ricardo JerichoViviana Group is a 41 y.o. female with history of hypertension and hypokalemia presenting with headache, elevated blood pressure, nausea, projectile vomiting, and loss of consciousness.  She did not receive IV t-PA due to acute intraparenchymal hemorrhage.   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  MRA - not performed.  CTA H&N - no AVM or aneurysm  CT repeat - pending  2D Echo - pending  LDL - 103  HgbA1c - pending  VTE prophylaxis - SCDs Diet NPO time specified  No antithrombotic prior to admission, now on No antithrombotic secondary to hemorrhage.  Ongoing aggressive stroke risk factor management  Therapy recommendations: pending  Disposition: Pending  Hydrocephalus  Repeat CT showed progressive hydrocephalus  NSG on board  S/p EVD  Drainage patent  Repeat CT head pending  Hypertension  Home meds - norvasc (missed doses)  Stable  BP goal < 140  Put on lisinopril 20mg  for BP control  Long-term BP goal normotensive  Other Stroke Risk Factors  Medical noncompliance  Fever with leukocytosis  101.5 axillary.   WBCs 18.0 -> 16.7  On unasyn started Friday, 10/05/2016  Other Active Problems  Hyperkalemia - 5.5 -> 4.6  Elevated creatinine - 1.06 - IVF   Hospital day # 2  This patient is critically ill due to significant brainstem and thalamic ICH with IVH, hydrocephalus, HTN, fever, leukocytosis and at significant risk of neurological worsening, death  form recurrent ICH, obstructive hydrocephalus, heart failure, fever, sepsis. This patient's care requires constant monitoring of vital signs, hemodynamics, respiratory and cardiac monitoring, review of multiple databases, neurological assessment, discussion with family, other specialists and medical decision making of high complexity. I had long discussion with husband at bedside regarding poor prognosis, treatment options, and etiology of ICH. I spent 40 minutes of neurocritical care time in the care of this patient.  Marvel PlanJindong Sayer Masini, MD PhD Stroke Neurology 10/06/2016 2:34 PM   To contact Stroke Continuity provider, please refer to [email or web address]. After hours, contact General Neurology

## 2016-10-06 NOTE — Progress Notes (Signed)
Patient ID: Anne JerichoViviana Uptain, female   DOB: 1975/10/18, 41 y.o.   MRN: [id number] No real change in physical exam. Some reactive linking eye movements at times. Pupils 2 or 3 mm and nonreactive, no corneals. Ventriculostomy patent with ICP 13.

## 2016-10-06 NOTE — Progress Notes (Signed)
  Echocardiogram 2D Echocardiogram has been performed.  Cathie BeamsGREGORY, Jihan Mellette 10/06/2016, 3:13 PM

## 2016-10-06 NOTE — Progress Notes (Signed)
PULMONARY / CRITICAL CARE MEDICINE   Name: Anne Olsen MRN: [id number] DOB: August 04, 1976    ADMISSION DATE:  10/04/2016  REFERRING MD:  Benjiman Core, M.D. / EDP  CHIEF COMPLAINT:  Acute Intracranial Hemorrhage  BRIEF SUMMARY:  41 y.o. female pmhx of HTN who presented 1/25 to [facility] via EMS for headache, weakness, and AMS. History was provided entirely by the husband [place]. 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 He got home from work around 5 pm when she developed a sudden headache and screamed out loud. Patient told him she had never had a headache like this before. She developed weakness on her left side and became very sleepy. Husband said he tried feeding her and putting her to bed, but she became more and more lethargic and he called 911.   On arrival to the ED, blood pressure was 161/132. Pupils were noted to be unequal. She was very lethargic on non rebreather. She subsequently became unresponsive and was intubated for airway protection and started on propofol for sedation. Repeat blood pressure was 196/95. CT head revealed an acute intraparenchymal hemorrhage in the right thalamus with a hematoma measuring 6.9 mL. Per husband, patient's only other medical problems include issues with high potassium. She started taking birth control pills about 2 months ago. She exercises regularly and is otherwise healthy. Husband is unaware of hypertension or aneurysms that run in her family.    SUBJECTIVE:  RN reports IVC drain remains at 10 cm H2O, no acute events overnight.  Husband at bedside.     VITAL SIGNS: BP (!) 141/86   Pulse (!) 58   Temp 98 F (36.7 C) (Axillary)   Resp (!) 24   Ht 5\' 1"  (1.549 m)   Wt 151 lb 7.3 oz (68.7 kg)   LMP  (LMP Unknown)   SpO2 99%   BMI 28.62 kg/m   HEMODYNAMICS:    VENTILATOR SETTINGS: Vent Mode: PRVC FiO2 (%):  [30 %] 30 % Set Rate:  [22 bmp] 22 bmp Vt Set:  [380 mL] 380  mL PEEP:  [5 cmH20] 5 cmH20 Plateau Pressure:  [11 cmH20-14 cmH20] 11 cmH20  INTAKE / OUTPUT: I/O last 3 completed shifts: In: 557.4 [I.V.:107.4; IV Piggyback:450] Out: 2565 [Urine:1708; Emesis/NG output:550; Drains:307]  PHYSICAL EXAMINATION: General: acutely ill appearing young adult female in NAD on vent HEENT: MM pink/moist, ETT in place Neuro: pupils 3mm R, + corneals, + gag, withdraws to pain on RLE only, no response to pain otherwise CV: s1s2 rrr, no m/r/g PULM: non-labored, lungs bilaterally coarse WU:JWJX, non-tender, bsx4 active  Extremities: warm/dry, no edema  Skin: no rashes or lesions   LABS: CBC Latest Ref Rng & Units 10/06/2016 10/05/2016 10/04/2016  WBC 4.0 - 10.5 K/uL 16.7(H) 18.0(H) -  Hemoglobin 12.0 - 15.0 g/dL 91.4 15.7(H) 15.6(H)  Hematocrit 36.0 - 46.0 % 43.2 46.5(H) 46.0  Platelets 150 - 400 K/uL 250 337 -   BMP Latest Ref Rng & Units 10/06/2016 10/05/2016 10/05/2016  Glucose 65 - 99 mg/dL 782(N) 562(Z) 308(M)  BUN 6 - 20 mg/dL 11 8 7   Creatinine 0.44 - 1.00 mg/dL 5.78(I) 6.96 2.95  Sodium 135 - 145 mmol/L 136 135 134(L)  Potassium 3.5 - 5.1 mmol/L 4.6 5.0 5.5(H)  Chloride 101 - 111 mmol/L 107 108 107  CO2 22 - 32 mmol/L 20(L) 22 21(L)  Calcium 8.9 - 10.3 mg/dL 2.8(U) 8.3(L) 8.3(L)     STUDIES:  CT  HEAD CODE STROKE 1/25 >  Acute intraparenchymal hemorrhage in the right thalamus with hematoma measuring 6.9 mL. Intraventricular penetration. Small amount of subarachnoid penetration.  CTA of the neck and head 1/25 > do not show any atherosclerotic narrowing. The vessels are somewhat tortuous suggesting hypertension. No sign of aneurysm or vascular malformation to explain the right thalamic hemorrhage.  PORT CXR 1/26 > Personally reviewed by me.Minimal patchy right lower lobe opacity. No other parenchymal infiltrate appreciated. No pleural effusion. Endotracheal tube in good position. Enteric feeding tube coursing below diaphragm.  CT HEAD 1/26 > decreased  volume of right thalamic hemorrhage, intraventricular and subarachnoid hemorrhage, progressive hydrocephalus  MICROBIOLOGY: MRSA PCR 1/25:  Negative  ANTIBIOTICS: Unasyn 1/26 >>   SIGNIFICANT EVENTS: 1/25 - Admit with AMS, intubated  1/26 - IVC drain placed   LINES/TUBES: OETT 7.5 1/24 >> OGT 1/24 >> FOLEY 1/24 >> PIV  ASSESSMENT / PLAN:  41 y.o. female status post acute intraparenchymal hemorrhage in the right thalamus with intraventricular penetration as well as a small amount of penetration into the subarachnoid space.    1. Right intraparenchymal brian hemorrhage - Neurology & NSGY following, appreciate input.  IVC drain placed 1/26.  Defer further neuro imaging to Neuro.  Notes reflect concern for poor prognosis.  Not on sedation, d/c propofol.  PRN fentanyl.   2. Acute Respiratory Insufficiency - in setting of ICH.  Continue PRVC, 8 cc/kg.  Wean PEEP/FiO2 for sats >92% 3. Essential Hypertension - BP goal for systolic > 150.  ICU monitoring  4. Right Lower Lobe Opacity - suspect possible aspiration PNA.  Continue Unasyn. D2/x abx.  Monitor CXR 5. Hyperkalemia - trend BMP.  Tele monitoring.   6. Leukocytosis - likely reactive.  Monitor CBC 7. Hyperglycemia - mild, monitor glucose on BMP.  Assess A1c 8. Prophylaxis - SCD's only, no heparin.  Pepcid IV Q12 9. Diet - NPO.  Consider TF in am 1/28.     CC Time: 30 minutes    Canary BrimBrandi Indya Oliveria, NP-C [facility] Pgr: [phone] or if no answer [phone] 10/06/2016, 7:55 AM

## 2016-10-07 ENCOUNTER — Inpatient Hospital Stay (HOSPITAL_COMMUNITY): Payer: 59

## 2016-10-07 DIAGNOSIS — R402434 Glasgow coma scale score 3-8, 24 hours or more after hospital admission: Secondary | ICD-10-CM

## 2016-10-07 LAB — BASIC METABOLIC PANEL
ANION GAP: 9 (ref 5–15)
BUN: 12 mg/dL (ref 6–20)
CHLORIDE: 108 mmol/L (ref 101–111)
CO2: 21 mmol/L — ABNORMAL LOW (ref 22–32)
Calcium: 8.2 mg/dL — ABNORMAL LOW (ref 8.9–10.3)
Creatinine, Ser: 0.85 mg/dL (ref 0.44–1.00)
GFR calc Af Amer: >60 mL/min (ref >60)
GFR calc non Af Amer: >60 mL/min (ref >60)
GLUCOSE: 103 mg/dL — AB (ref 65–99)
POTASSIUM: 5 mmol/L (ref 3.5–5.1)
Sodium: 138 mmol/L (ref 135–145)

## 2016-10-07 LAB — GLUCOSE, CAPILLARY
GLUCOSE-CAPILLARY: 73 mg/dL (ref 65–99)
Glucose-Capillary: 73 mg/dL (ref 65–99)
Glucose-Capillary: 91 mg/dL (ref 65–99)

## 2016-10-07 LAB — CBC
HEMATOCRIT: 42.7 % (ref 36.0–46.0)
HEMOGLOBIN: 14 g/dL (ref 12.0–15.0)
MCH: 29.5 pg (ref 26.0–34.0)
MCHC: 32.8 g/dL (ref 30.0–36.0)
MCV: 89.9 fL (ref 78.0–100.0)
Platelets: 228 10*3/uL (ref 150–400)
RBC: 4.75 MIL/uL (ref 3.87–5.11)
RDW: 12.6 % (ref 11.5–15.5)
WBC: 14.5 10*3/uL — AB (ref 4.0–10.5)

## 2016-10-07 LAB — HEMOGLOBIN A1C
Hgb A1c MFr Bld: 5.1 % (ref 4.8–5.6)
Mean Plasma Glucose: 100 mg/dL

## 2016-10-07 LAB — PHOSPHORUS
PHOSPHORUS: 2.4 mg/dL — AB (ref 2.5–4.6)
Phosphorus: 2.2 mg/dL — ABNORMAL LOW (ref 2.5–4.6)

## 2016-10-07 LAB — MAGNESIUM
Magnesium: 2.1 mg/dL (ref 1.7–2.4)
Magnesium: 2 mg/dL (ref 1.7–2.4)

## 2016-10-07 MED ORDER — FENTANYL CITRATE (PF) 100 MCG/2ML IJ SOLN
100.0000 ug | Freq: Once | INTRAMUSCULAR | Status: AC
  Administered 2016-10-07: 100 ug via INTRAVENOUS

## 2016-10-07 MED ORDER — VITAL HIGH PROTEIN PO LIQD
1000.0000 mL | ORAL | Status: DC
  Administered 2016-10-07: 1000 mL

## 2016-10-07 MED ORDER — AMANTADINE HCL 100 MG PO CAPS
200.0000 mg | ORAL_CAPSULE | Freq: Two times a day (BID) | ORAL | Status: DC
  Administered 2016-10-07 – 2016-10-18 (×24): 200 mg via ORAL
  Filled 2016-10-07 (×25): qty 2

## 2016-10-07 MED ORDER — PRO-STAT SUGAR FREE PO LIQD
30.0000 mL | Freq: Two times a day (BID) | ORAL | Status: DC
  Administered 2016-10-07 (×2): 30 mL
  Filled 2016-10-07 (×2): qty 30

## 2016-10-07 NOTE — Progress Notes (Signed)
STROKE TEAM PROGRESS NOTE   SUBJECTIVE (INTERVAL HISTORY) Husband at bedside. Pt still intubated not on sedation, seems a little more responsive than yesterday but still in deep coma. Tmax 101.5 overnight. CT repeat much improved hydrocephalus.    OBJECTIVE Temp:  [98.4 F (36.9 C)-102.1 F (38.9 C)] 99.2 F (37.3 C) (01/28 0720) Pulse Rate:  [54-72] 67 (01/28 0900) Cardiac Rhythm: Normal sinus rhythm (01/28 0800) Resp:  [16-24] 17 (01/28 0900) BP: (116-154)/(69-92) 128/75 (01/28 0900) SpO2:  [98 %-100 %] 100 % (01/28 0900) FiO2 (%):  [30 %] 30 % (01/28 0840) Weight:  [68.8 kg (151 lb 10.8 oz)] 68.8 kg (151 lb 10.8 oz) (01/28 0500)  CBC:   Recent Labs Lab 10/06/16 0413 10/06/16 1123 10/07/16 0327  WBC 16.7* 16.2* 14.5*  NEUTROABS 13.5* 12.5*  --   HGB 14.6 14.3 14.0  HCT 43.2 43.2 42.7  MCV 88.2 89.6 89.9  PLT 250 250 228    Basic Metabolic Panel:   Recent Labs Lab 10/06/16 0413 10/07/16 0327  NA 136 138  K 4.6 5.0  CL 107 108  CO2 20* 21*  GLUCOSE 102* 103*  BUN 11 12  CREATININE 1.06* 0.85  CALCIUM 8.4* 8.2*  MG 2.1  --   PHOS 2.8  --     Lipid Panel:     Component Value Date/Time   CHOL 182 10/06/2016 0413   TRIG 82 10/06/2016 0413   HDL 63 10/06/2016 0413   CHOLHDL 2.9 10/06/2016 0413   VLDL 16 10/06/2016 0413   LDLCALC 103 (H) 10/06/2016 0413   HgbA1c: No results found for: HGBA1C Urine Drug Screen:     Component Value Date/Time   LABOPIA NONE DETECTED 10/04/2016 2054   COCAINSCRNUR NONE DETECTED 10/04/2016 2054   LABBENZ NONE DETECTED 10/04/2016 2054   AMPHETMU NONE DETECTED 10/04/2016 2054   THCU NONE DETECTED 10/04/2016 2054   LABBARB NONE DETECTED 10/04/2016 2054      IMAGING I have personally reviewed the radiological images below and agree with the radiology interpretations.  Ct Angio Head and Neck W Or Wo Contrast 10/04/2016 Acute intraparenchymal hemorrhage in the right thalamus with hematoma measuring 6.9 mL.  Intraventricular penetration. Small amount of subarachnoid penetration. CTA of the neck and head do not show any atherosclerotic narrowing. The vessels are somewhat tortuous suggesting hypertension. No sign of aneurysm or vascular malformation to explain the right thalamic hemorrhage.   Dg Chest Portable 1 View 10/04/2016 1. Endotracheal tube seen ending 2 cm above the carina.  2. Enteric tube noted ending overlying the body of the stomach, with the side port about the gastroesophageal junction.  3. Vascular congestion and mild cardiomegaly. Mild patchy left-sided airspace opacity may reflect atelectasis or mild interstitial edema.  Ct Head Code Stroke W/o Cm 10/04/2016 Acute intraparenchymal hemorrhage in the right thalamus with hematoma measuring 6.9 mL. Intraventricular penetration. Small amount of subarachnoid penetration. CTA of the neck and head do not show any atherosclerotic narrowing. The vessels are somewhat tortuous suggesting hypertension. No sign of aneurysm or vascular malformation to explain the right thalamic hemorrhage.   Ct Head W/o Cm 10/05/2016 Decrease in volume of right thalamic hemorrhage. Intraventricular and subarachnoid hemorrhage again noted. Progressive hydrocephalus.  10/07/2016 Right thalamic hemorrhage with subarachnoid and ventricular hemorrhage unchanged. No new hemorrhage Interval placement of right frontal catheter with decrease inventricle size. Ventricle size now normal.  TTE  10/06/2016 Study Conclusions  Left ventricle: The cavity size was normal. Systolic function was normal.  The estimated ejection  fraction was in the range of 55% to 60%.  Wall motion was normal; there were no regional wall motion abnormalities. Impressions: No cardiac source of emboli was indentified.   PHYSICAL EXAM  Temp:  [98.4 F (36.9 C)-102.1 F (38.9 C)] 99.2 F (37.3 C) (01/28 0720) Pulse Rate:  [54-72] 67 (01/28 0900) Resp:  [16-24] 17 (01/28 0900) BP:  (116-154)/(69-92) 128/75 (01/28 0900) SpO2:  [98 %-100 %] 100 % (01/28 0900) FiO2 (%):  [30 %] 30 % (01/28 0840) Weight:  [68.8 kg (151 lb 10.8 oz)] 68.8 kg (151 lb 10.8 oz) (01/28 0500)  General - Well nourished, well developed, intubated, not on sedation, in coma.  Ophthalmologic - Fundi not visualized.  Cardiovascular - Regular rate and rhythm.  Neuro - intubated, not on sedation, not responsive, in coma. Pupils 2.42mm bilaterally, no pupillary reflex, weak corneal reflexes bilaterally, no doll's eyes, eyes mid position. Positive gag and cough, breathing over the vent. On pain stimulation, no movement on BUEs and LLE, but mild withdraw at RLE, no babinski, DTR diminished. Sensation, coordination and gait not able to test.    ASSESSMENT/PLAN Ms. Nelta Caudill is a 41 y.o. female with history of hypertension and hypokalemia presenting with headache, elevated blood pressure, nausea, projectile vomiting, and loss of consciousness.  She did not receive IV t-PA due to acute intraparenchymal hemorrhage.   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  2D Echo - EF 55-60%. No cardiac source of emboli identified.  LDL - 103  HgbA1c - pending  VTE prophylaxis - SCDs Diet NPO time specified  No antithrombotic prior to admission, now on No antithrombotic secondary to hemorrhage.  Ongoing aggressive stroke risk factor management  Family OK with trach, PEG, NH placement if needed.  Therapy recommendations: pending  Disposition: Pending  Hydrocephalus  Repeat CT showed progressive hydrocephalus  NSG on board  S/p Right frontal ventriculostomy drain - Dr Yetta Barre - 10/05/16  Drainage patent  Repeat CT head much  improved hydrocephalus  Hypertension  Home meds - norvasc (missed doses)  Stable  BP goal < 140  Put on lisinopril 20mg  for BP control  Long-term BP goal normotensive  Other Stroke Risk Factors  Medical noncompliance  Fever with leukocytosis  101.5 axillary -> 99.2  WBCs 18.0 -> 16.7 -> 14.5  On unasyn started Friday, 10/05/2016  Other Active Problems  Hyperkalemia - 5.5 -> 4.6 -> 5.0  Elevated creatinine - 1.06 -> 0.85  Initiate amantadine for arousal - 200mg  bid   Hospital day # 3  This patient is critically ill due to significant brainstem and thalamic ICH with IVH, hydrocephalus, HTN, fever, leukocytosis and at significant risk of neurological worsening, death form recurrent ICH, obstructive hydrocephalus, heart failure, fever, sepsis. This patient's care requires constant monitoring of vital signs, hemodynamics, respiratory and cardiac monitoring, review of multiple databases, neurological assessment, discussion with family, other specialists and medical decision making of high complexity. I had further discussion with husband at bedside regarding poor prognosis, treatment options, and etiology of ICH. Family OK with trach, PEG, NH placement if needed. I spent 40 minutes of neurocritical care time in the care of this patient.  Marvel Plan, MD PhD Stroke Neurology 10/07/2016 11:52 AM  To contact Stroke Continuity provider, please refer to [email or web address] After hours, contact General Neurology

## 2016-10-07 NOTE — Progress Notes (Signed)
Patient ID: Anne JerichoViviana Biondo, female   DOB: 06-13-1976, 41 y.o.   MRN: [id number] Patient essentially unchanged despite ventricular drainage. Prognosis remains grim.

## 2016-10-07 NOTE — Progress Notes (Signed)
PULMONARY / CRITICAL CARE MEDICINE   Name: Anne JerichoViviana Bigos MRN: [id number] DOB: 1976/01/13    ADMISSION DATE:  10/04/2016  REFERRING MD:  Benjiman CoreNathan Pickering, M.D. / EDP  CHIEF COMPLAINT:  Acute Intracranial Hemorrhage  BRIEF SUMMARY:  41 y.o. female pmhx of HTN who presented 1/25 to [facility] via EMS for headache, weakness, and AMS. History was provided entirely by the husband [place]ammad. 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 He got home from work around 5 pm when she developed a sudden headache and screamed out loud. Patient told him she had never had a headache like this before. She developed weakness on her left side and became very sleepy. Husband said he tried feeding her and putting her to bed, but she became more and more lethargic and he called 911.   On arrival to the ED, blood pressure was 161/132. Pupils were noted to be unequal. She was very lethargic on non rebreather. She subsequently became unresponsive and was intubated for airway protection and started on propofol for sedation. Repeat blood pressure was 196/95. CT head revealed an acute intraparenchymal hemorrhage in the right thalamus with a hematoma measuring 6.9 mL. Per husband, patient's only other medical problems include issues with high potassium. She started taking birth control pills about 2 months ago. She exercises regularly and is otherwise healthy. Husband is unaware of hypertension or aneurysms that run in her family.    SUBJECTIVE:  RN reports pt has had increased spontaneous movements on the right side - slight movement of foot / hand.  Occasional raising of eyebrows.  ICP 3.  No acute events.  RT reports pt weaned for 12 hours on PSV yesterday.    VITAL SIGNS: BP 125/77 (BP Location: Left Arm)   Pulse 66   Temp 99.2 F (37.3 C) (Axillary)   Resp 18   Ht 5\' 1"  (1.549 m)   Wt 151 lb 10.8 oz (68.8 kg)   LMP  (LMP Unknown)   SpO2 100%   BMI 28.66  kg/m   HEMODYNAMICS:    VENTILATOR SETTINGS: Vent Mode: PSV;CPAP FiO2 (%):  [30 %] 30 % Set Rate:  [22 bmp] 22 bmp Vt Set:  [380 mL] 380 mL PEEP:  [5 cmH20] 5 cmH20 Pressure Support:  [10 cmH20] 10 cmH20 Plateau Pressure:  [12 cmH20-13 cmH20] 12 cmH20  INTAKE / OUTPUT: I/O last 3 completed shifts: In: 2465 [P.O.:45; I.V.:1670; IV Piggyback:750] Out: 1446 [Urine:732; Emesis/NG output:300; Drains:414]  PHYSICAL EXAMINATION: General: young adult female, appears critically ill in NAD HEENT: MM pink/moist, ETT in place, IVC drain in place  Neuro: no response to voice, slight flicker of RLE noted / spontaneous, pupils 2-3 mm CV: s1s2 rrr, no m/r/g PULM: non-labored, lungs bilaterally clear WU:JWJXGI:soft, non-tender, bsx4 active  Extremities: warm/dry, no edema  Skin: no rashes or lesions    LABS: CBC Latest Ref Rng & Units 10/07/2016 10/06/2016 10/06/2016  WBC 4.0 - 10.5 K/uL 14.5(H) 16.2(H) 16.7(H)  Hemoglobin 12.0 - 15.0 g/dL 91.414.0 78.214.3 95.614.6  Hematocrit 36.0 - 46.0 % 42.7 43.2 43.2  Platelets 150 - 400 K/uL 228 250 250   BMP Latest Ref Rng & Units 10/07/2016 10/06/2016 10/05/2016  Glucose 65 - 99 mg/dL 213(Y103(H) 865(H102(H) 846(N112(H)  BUN 6 - 20 mg/dL 12 11 8   Creatinine 0.44 - 1.00 mg/dL 6.290.85 5.28(U1.06(H) 1.320.97  Sodium 135 - 145 mmol/L 138 136 135  Potassium 3.5 - 5.1 mmol/L 5.0 4.6 5.0  Chloride 101 - 111 mmol/L 108 107 108  CO2 22 - 32 mmol/L 21(L) 20(L) 22  Calcium 8.9 - 10.3 mg/dL 8.2(L) 8.4(L) 8.3(L)     STUDIES:  CT HEAD CODE STROKE 1/25 >  Acute intraparenchymal hemorrhage in the right thalamus with hematoma measuring 6.9 mL. Intraventricular penetration. Small amount of subarachnoid penetration.  CTA of the neck and head 1/25 > do not show any atherosclerotic narrowing. The vessels are somewhat tortuous suggesting hypertension. No sign of aneurysm or vascular malformation to explain the right thalamic hemorrhage.  PORT CXR 1/26 > Personally reviewed by me.Minimal patchy right lower  lobe opacity. No other parenchymal infiltrate appreciated. No pleural effusion. Endotracheal tube in good position. Enteric feeding tube coursing below diaphragm.  CT HEAD 1/26 > decreased volume of right thalamic hemorrhage, intraventricular and subarachnoid hemorrhage, progressive hydrocephalus  MICROBIOLOGY: MRSA PCR 1/25:  Negative  ANTIBIOTICS: Unasyn 1/26 >>   SIGNIFICANT EVENTS: 1/25 - Admit with AMS, intubated  1/26 - IVC drain placed   LINES/TUBES: OETT 7.5 1/24 >> OGT 1/24 >> FOLEY 1/24 >> PIV  ASSESSMENT / PLAN:  41 y.o. female status post acute intraparenchymal hemorrhage in the right thalamus with intraventricular penetration as well as a small amount of penetration into the subarachnoid space.  1. Right Intraparenchymal Brain Hemorrhage - defer to Neuro / NSGY, IVC placed 1/26. Notes reflect concern for poor prognosis.  I am not sure the husband understands this.  PRN fentanyl for pain 2. Acute Respiratory Failure - secondary to ICH.  Continue PRVC, allow for PSV as tolerated but no plan for extubation due to mental status.   3. RLL Opacity - suspect possible aspiration, continue unasyn.  D3/x abx.  Trend CXR 4. Hyperkalemia - monitor BMP / telemetry  5. Leukocytosis - likely reactive, monitor WBC trend / fever  6. Hyperglycemia - mild, monitor glucose on BMP  7. At Risk Protein Calorie Malnutrition - add TF per nutrition  8. Prophylaxis - SCD's only, no heparin with ICH.  Pepcid Q12 9. Diet - NPO   CC Time: 30 minutes    Canary Brim, NP-C [facility] Pgr: [phone] or if no answer [phone] 10/07/2016, 11:08 AM

## 2016-10-08 ENCOUNTER — Inpatient Hospital Stay (HOSPITAL_COMMUNITY): Payer: 59

## 2016-10-08 DIAGNOSIS — G919 Hydrocephalus, unspecified: Secondary | ICD-10-CM

## 2016-10-08 DIAGNOSIS — R58 Hemorrhage, not elsewhere classified: Secondary | ICD-10-CM

## 2016-10-08 DIAGNOSIS — J9600 Acute respiratory failure, unspecified whether with hypoxia or hypercapnia: Secondary | ICD-10-CM

## 2016-10-08 LAB — CBC
HEMATOCRIT: 40.2 % (ref 36.0–46.0)
Hemoglobin: 12.9 g/dL (ref 12.0–15.0)
MCH: 29.1 pg (ref 26.0–34.0)
MCHC: 32.1 g/dL (ref 30.0–36.0)
MCV: 90.5 fL (ref 78.0–100.0)
Platelets: 237 10*3/uL (ref 150–400)
RBC: 4.44 MIL/uL (ref 3.87–5.11)
RDW: 12.5 % (ref 11.5–15.5)
WBC: 12 10*3/uL — AB (ref 4.0–10.5)

## 2016-10-08 LAB — BASIC METABOLIC PANEL
Anion gap: 7 (ref 5–15)
BUN: 12 mg/dL (ref 6–20)
CO2: 23 mmol/L (ref 22–32)
Calcium: 8.1 mg/dL — ABNORMAL LOW (ref 8.9–10.3)
Chloride: 109 mmol/L (ref 101–111)
Creatinine, Ser: 0.85 mg/dL (ref 0.44–1.00)
Glucose, Bld: 111 mg/dL — ABNORMAL HIGH (ref 65–99)
POTASSIUM: 4.4 mmol/L (ref 3.5–5.1)
SODIUM: 139 mmol/L (ref 135–145)

## 2016-10-08 LAB — PHOSPHORUS
PHOSPHORUS: 2.2 mg/dL — AB (ref 2.5–4.6)
Phosphorus: 2.5 mg/dL (ref 2.5–4.6)

## 2016-10-08 LAB — GLUCOSE, CAPILLARY
GLUCOSE-CAPILLARY: 90 mg/dL (ref 65–99)
GLUCOSE-CAPILLARY: 95 mg/dL (ref 65–99)
GLUCOSE-CAPILLARY: 97 mg/dL (ref 65–99)
Glucose-Capillary: 106 mg/dL — ABNORMAL HIGH (ref 65–99)
Glucose-Capillary: 109 mg/dL — ABNORMAL HIGH (ref 65–99)
Glucose-Capillary: 143 mg/dL — ABNORMAL HIGH (ref 65–99)
Glucose-Capillary: 88 mg/dL (ref 65–99)

## 2016-10-08 LAB — MAGNESIUM
MAGNESIUM: 1.7 mg/dL (ref 1.7–2.4)
Magnesium: 1.9 mg/dL (ref 1.7–2.4)

## 2016-10-08 LAB — PROCALCITONIN: Procalcitonin: <0.1 ng/mL

## 2016-10-08 MED ORDER — HEPARIN SODIUM (PORCINE) 5000 UNIT/ML IJ SOLN
5000.0000 [IU] | Freq: Three times a day (TID) | INTRAMUSCULAR | Status: DC
  Administered 2016-10-08 – 2016-10-23 (×46): 5000 [IU] via SUBCUTANEOUS
  Filled 2016-10-08 (×44): qty 1

## 2016-10-08 MED ORDER — VITAL AF 1.2 CAL PO LIQD
1000.0000 mL | ORAL | Status: DC
  Administered 2016-10-09 – 2016-10-22 (×15): 1000 mL
  Filled 2016-10-08: qty 1000

## 2016-10-08 NOTE — Progress Notes (Signed)
Placed back on full support at this time 

## 2016-10-08 NOTE — Progress Notes (Signed)
Nutrition Follow-up  INTERVENTION:   Vital AF 1.2 @ 60 ml/hr (1440 ml/day) Provides: 1728 kcal, 108 grams protein, and 1167 ml H2O.   NUTRITION DIAGNOSIS:   Inadequate oral intake related to inability to eat as evidenced by NPO status. Ongoing.   GOAL:   Patient will meet greater than or equal to 90% of their needs Progressing.   MONITOR:   TF tolerance, Vent status, I & O's  REASON FOR ASSESSMENT:   Consult Enteral/tube feeding initiation and management  ASSESSMENT:   Pt with PMH of HTN admitted with R intraparenchymal brain hemorrhage.   Pt discussed during ICU rounds and with RN.  Per MD family wants trach/PEG 1/26 EVD in place 1/28 Adult TF protocol ordered  Patient is currently intubated on ventilator support MV: 9.1 L/min Temp (24hrs), Avg:100.7 F (38.2 C), Min:98.6 F (37 C), Max:102.4 F (39.1 C)   Diet Order:  Diet NPO time specified  Skin:  Reviewed, no issues  Last BM:  1/24  Height:   Ht Readings from Last 1 Encounters:  10/04/16 5\' 1"  (1.549 m)    Weight:   Wt Readings from Last 1 Encounters:  10/08/16 154 lb 8.7 oz (70.1 kg)    Ideal Body Weight:  47.7 kg  BMI:  Body mass index is 29.2 kg/m.  Estimated Nutritional Needs:   Kcal:  1754  Protein:  90-100 grams  Fluid:  > 1.7 L/day  EDUCATION NEEDS:   No education needs identified at this time  Kendell BaneHeather Zailyn Rowser RD, LDN, CNSC [phone] Pager [phone] After Hours Pager

## 2016-10-08 NOTE — Progress Notes (Signed)
PULMONARY / CRITICAL CARE MEDICINE   Name: Anne Olsen MRN: [id number] DOB: 01-Oct-1975    ADMISSION DATE:  10/04/2016  REFERRING MD:  Benjiman Core, M.D. / EDP  CHIEF COMPLAINT:  Acute Intracranial Hemorrhage  BRIEF SUMMARY:  41 y.o. female pmhx of HTN who presented 1/25 to [facility] via EMS for headache, weakness, and AMS. History was provided entirely by the husband [place]. 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 He got home from work around 5 pm when she developed a sudden headache and screamed out loud. Patient told him she had never had a headache like this before. She developed weakness on her left side and became very sleepy. Husband said he tried feeding her and putting her to bed, but she became more and more lethargic and he called 911.   On arrival to the ED, blood pressure was 161/132. Pupils were noted to be unequal. She was very lethargic on non rebreather. She subsequently became unresponsive and was intubated for airway protection and started on propofol for sedation. Repeat blood pressure was 196/95. CT head revealed an acute intraparenchymal hemorrhage in the right thalamus with a hematoma measuring 6.9 mL. Per husband, patient's only other medical problems include issues with high potassium. She started taking birth control pills about 2 months ago. She exercises regularly and is otherwise healthy. Husband is unaware of hypertension or aneurysms that run in her family.    SUBJECTIVE: moving right arm   VITAL SIGNS: BP 140/88   Pulse 69   Temp 98.6 F (37 C) (Axillary)   Resp (!) 22   Ht 5\' 1"  (1.549 m)   Wt 70.1 kg (154 lb 8.7 oz)   LMP  (LMP Unknown)   SpO2 100%   BMI 29.20 kg/m   HEMODYNAMICS:    VENTILATOR SETTINGS: Vent Mode: PRVC FiO2 (%):  [30 %] 30 % Set Rate:  [22 bmp] 22 bmp Vt Set:  [380 mL] 380 mL PEEP:  [5 cmH20] 5 cmH20 Pressure Support:  [10 cmH20] 10 cmH20 Plateau Pressure:   [13 cmH20-14 cmH20] 13 cmH20  INTAKE / OUTPUT: I/O last 3 completed shifts: In: 3289 [P.O.:45; I.V.:2223.3; NG/GT:270.7; IV Piggyback:750] Out: 2005 [Urine:1600; Drains:405]  PHYSICAL EXAMINATION: General: young adult female, appears critically ill in NAD HEENT: MM pink/moist, ETT in place, IVC drain in place  Neuro: no response to voice, reponds to pain, some slight movement rue, NOT fc CV: s1s2 rrr, no m/r/g PULM: CTA WG:NFAO, non-tender, bsx4 active  Extremities: warm/dry, no edema  Skin: no rashes or lesions    LABS: CBC Latest Ref Rng & Units 10/08/2016 10/07/2016 10/06/2016  WBC 4.0 - 10.5 K/uL 12.0(H) 14.5(H) 16.2(H)  Hemoglobin 12.0 - 15.0 g/dL 13.0 86.5 78.4  Hematocrit 36.0 - 46.0 % 40.2 42.7 43.2  Platelets 150 - 400 K/uL 237 228 250   BMP Latest Ref Rng & Units 10/08/2016 10/07/2016 10/06/2016  Glucose 65 - 99 mg/dL 696(E) 952(W) 413(K)  BUN 6 - 20 mg/dL 12 12 11   Creatinine 0.44 - 1.00 mg/dL 4.40 1.02 7.25(D)  Sodium 135 - 145 mmol/L 139 138 136  Potassium 3.5 - 5.1 mmol/L 4.4 5.0 4.6  Chloride 101 - 111 mmol/L 109 108 107  CO2 22 - 32 mmol/L 23 21(L) 20(L)  Calcium 8.9 - 10.3 mg/dL 8.1(L) 8.2(L) 8.4(L)     STUDIES:  CT HEAD CODE STROKE 1/25 >  Acute intraparenchymal hemorrhage in the right thalamus with hematoma measuring  6.9 mL. Intraventricular penetration. Small amount of subarachnoid penetration.  CTA of the neck and head 1/25 > do not show any atherosclerotic narrowing. The vessels are somewhat tortuous suggesting hypertension. No sign of aneurysm or vascular malformation to explain the right thalamic hemorrhage.  PORT CXR 1/26 > Personally reviewed by me.Minimal patchy right lower lobe opacity. No other parenchymal infiltrate appreciated. No pleural effusion. Endotracheal tube in good position. Enteric feeding tube coursing below diaphragm.  CT HEAD 1/26 > decreased volume of right thalamic hemorrhage, intraventricular and subarachnoid hemorrhage,  progressive hydrocephalus  MICROBIOLOGY: MRSA PCR 1/25:  Negative  ANTIBIOTICS: Unasyn 1/26 >>   SIGNIFICANT EVENTS: 1/25 - Admit with AMS, intubated  1/26 - IVC drain placed   LINES/TUBES: OETT 7.5 1/24 >> OGT 1/24 >> FOLEY 1/24 >> PIV  ASSESSMENT / PLAN:  41 y.o. female status post acute intraparenchymal hemorrhage in the right thalamus with intraventricular penetration as well as a small amount of penetration into the subarachnoid space.  1. Right Intraparenchymal Brain Hemorrhage - defer to Neuro / NSGY, IVC placed 1/26. Drain at 10 cm, neuro assessments, head of bed elevation, low threshold eeg 2. Acute Respiratory Failure - ABg reviewed last, keep same MV rest, weaning PS 8 goal 4-6 hours, likley will n eed trach end of week, pending any neuro changes 3. RLL Opacity - resolved, fever likely neuro, secretions low, will likely reduce abx course, assess pct  4. Hyperkalemia resolved, K in am , kvo 5. Leukocytosis - likely reactive, improved 6. Hyperglycemia -  controlled 7. At Risk Protein Calorie Malnutrition -tf to goal, monitor BM, needed 8. Prophylaxis - SCD's only, no heparin with ICH.  Pepcid Q12h  Ccm time 30 min   Mcarthur Rossettianiel J. Tyson AliasFeinstein, MD, FACP Pgr: [phone] [place]

## 2016-10-08 NOTE — Progress Notes (Addendum)
STROKE TEAM PROGRESS NOTE   SUBJECTIVE (INTERVAL HISTORY) No family is at bedside. Pt still intubated not on sedation, moving more on the right foot and started to move a little bit on the right arm, but still in coma. Tmax 102.4 overnight. On amantadine since yesterday.     OBJECTIVE Temp:  [98.6 F (37 C)-102.4 F (39.1 C)] 98.6 F (37 C) (01/29 0800) Pulse Rate:  [64-85] 84 (01/29 1200) Cardiac Rhythm: Normal sinus rhythm (01/29 0807) Resp:  [14-27] 27 (01/29 1200) BP: (117-150)/(72-90) 143/89 (01/29 1200) SpO2:  [99 %-100 %] 100 % (01/29 1200) FiO2 (%):  [30 %] 30 % (01/29 1124) Weight:  [154 lb 8.7 oz (70.1 kg)] 154 lb 8.7 oz (70.1 kg) (01/29 0430)  CBC:   Recent Labs Lab 10/06/16 0413 10/06/16 1123 10/07/16 0327 10/08/16 0406  WBC 16.7* 16.2* 14.5* 12.0*  NEUTROABS 13.5* 12.5*  --   --   HGB 14.6 14.3 14.0 12.9  HCT 43.2 43.2 42.7 40.2  MCV 88.2 89.6 89.9 90.5  PLT 250 250 228 237    Basic Metabolic Panel:   Recent Labs Lab 10/07/16 0327  10/07/16 1606 10/08/16 0406  NA 138  --   --  139  K 5.0  --   --  4.4  CL 108  --   --  109  CO2 21*  --   --  23  GLUCOSE 103*  --   --  111*  BUN 12  --   --  12  CREATININE 0.85  --   --  0.85  CALCIUM 8.2*  --   --  8.1*  MG  --   < > 2.0 1.9  PHOS  --   < > 2.2* 2.2*  < > = values in this interval not displayed.  Lipid Panel:     Component Value Date/Time   CHOL 182 10/06/2016 0413   TRIG 82 10/06/2016 0413   HDL 63 10/06/2016 0413   CHOLHDL 2.9 10/06/2016 0413   VLDL 16 10/06/2016 0413   LDLCALC 103 (H) 10/06/2016 0413   HgbA1c:  Lab Results  Component Value Date   HGBA1C 5.1 10/06/2016   Urine Drug Screen:     Component Value Date/Time   LABOPIA NONE DETECTED 10/04/2016 2054   COCAINSCRNUR NONE DETECTED 10/04/2016 2054   LABBENZ NONE DETECTED 10/04/2016 2054   AMPHETMU NONE DETECTED 10/04/2016 2054   THCU NONE DETECTED 10/04/2016 2054   LABBARB NONE DETECTED 10/04/2016 2054       IMAGING I have personally reviewed the radiological images below and agree with the radiology interpretations.  Ct Angio Head and Neck W Or Wo Contrast 10/04/2016 Acute intraparenchymal hemorrhage in the right thalamus with hematoma measuring 6.9 mL. Intraventricular penetration. Small amount of subarachnoid penetration. CTA of the neck and head do not show any atherosclerotic narrowing. The vessels are somewhat tortuous suggesting hypertension. No sign of aneurysm or vascular malformation to explain the right thalamic hemorrhage.   Dg Chest Portable 1 View 10/04/2016 1. Endotracheal tube seen ending 2 cm above the carina.  2. Enteric tube noted ending overlying the body of the stomach, with the side port about the gastroesophageal junction.  3. Vascular congestion and mild cardiomegaly. Mild patchy left-sided airspace opacity may reflect atelectasis or mild interstitial edema.  Ct Head Code Stroke W/o Cm 10/04/2016 Acute intraparenchymal hemorrhage in the right thalamus with hematoma measuring 6.9 mL. Intraventricular penetration. Small amount of subarachnoid penetration. CTA of the neck and head do  not show any atherosclerotic narrowing. The vessels are somewhat tortuous suggesting hypertension. No sign of aneurysm or vascular malformation to explain the right thalamic hemorrhage.   Ct Head W/o Cm 10/05/2016 Decrease in volume of right thalamic hemorrhage. Intraventricular and subarachnoid hemorrhage again noted. Progressive hydrocephalus.  10/07/2016 Right thalamic hemorrhage with subarachnoid and ventricular hemorrhage unchanged. No new hemorrhage Interval placement of right frontal catheter with decrease inventricle size. Ventricle size now normal.  TTE  10/06/2016 Study Conclusions  Left ventricle: The cavity size was normal. Systolic function was normal.  The estimated ejection fraction was in the range of 55% to 60%.  Wall motion was normal; there were no regional wall motion  abnormalities. Impressions: No cardiac source of emboli was indentified.   PHYSICAL EXAM  Temp:  [98.6 F (37 C)-102.4 F (39.1 C)] 98.6 F (37 C) (01/29 0800) Pulse Rate:  [64-85] 84 (01/29 1200) Resp:  [14-27] 27 (01/29 1200) BP: (117-150)/(72-90) 143/89 (01/29 1200) SpO2:  [99 %-100 %] 100 % (01/29 1200) FiO2 (%):  [30 %] 30 % (01/29 1124) Weight:  [154 lb 8.7 oz (70.1 kg)] 154 lb 8.7 oz (70.1 kg) (01/29 0430)  General - Well nourished, well developed, intubated, not on sedation, in coma.  Ophthalmologic - Fundi not visualized.  Cardiovascular - Regular rate and rhythm.  Neuro - intubated, not on sedation, not responsive to voice, in coma. Pupils 2.545mm bilaterally, no pupillary reflex, no corneal reflexes bilaterally, no doll's eyes, eyes mid position. Positive gag and cough, breathing over the vent. On pain stimulation, no movement on LUEs and LLE, but mild withdraw at RLE and flicker movement of RUE, no babinski, DTR diminished. Sensation, coordination and gait not able to test.    ASSESSMENT/PLAN Ms. Ricardo JerichoViviana Grandmaison is a 41 y.o. female with history of hypertension and hypokalemia presenting with headache, elevated blood pressure, nausea, projectile vomiting, and loss of consciousness.  She did not receive IV t-PA due to acute intraparenchymal hemorrhage.   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  2D Echo - EF 55-60%. No cardiac source of emboli identified.  LDL - 103  HgbA1c 5.1  VTE prophylaxis - subq heparin Diet NPO time specified  No antithrombotic prior to admission, now on No antithrombotic secondary to hemorrhage.  On amantadine for arousal - 200mg  bid  Ongoing aggressive stroke  risk factor management  Family OK with trach, PEG, NH placement if needed.  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 This patient's care requires constant monitoring of vital signs, hemodynamics, respiratory and cardiac monitoring, review of multiple databases, neurological assessment, discussion with family, other specialists and medical decision making of high complexity. I spent 35 minutes of neurocritical care time in the care of this patient.  Marvel Plan, MD PhD Stroke Neurology 10/08/2016 1:00 PM    To contact Stroke Continuity provider, please refer to [email or web address]. After hours, contact General Neurology

## 2016-10-09 ENCOUNTER — Inpatient Hospital Stay (HOSPITAL_COMMUNITY): Payer: 59

## 2016-10-09 DIAGNOSIS — I610 Nontraumatic intracerebral hemorrhage in hemisphere, subcortical: Secondary | ICD-10-CM

## 2016-10-09 LAB — BASIC METABOLIC PANEL
ANION GAP: 6 (ref 5–15)
BUN: 9 mg/dL (ref 6–20)
CALCIUM: 8.3 mg/dL — AB (ref 8.9–10.3)
CO2: 24 mmol/L (ref 22–32)
Chloride: 108 mmol/L (ref 101–111)
Creatinine, Ser: 0.79 mg/dL (ref 0.44–1.00)
GFR calc Af Amer: >60 mL/min (ref >60)
Glucose, Bld: 127 mg/dL — ABNORMAL HIGH (ref 65–99)
POTASSIUM: 4.1 mmol/L (ref 3.5–5.1)
SODIUM: 138 mmol/L (ref 135–145)

## 2016-10-09 LAB — CBC WITH DIFFERENTIAL/PLATELET
Basophils Absolute: 0 10*3/uL (ref 0.0–0.1)
Basophils Relative: 0 %
Eosinophils Absolute: 0.2 10*3/uL (ref 0.0–0.7)
Eosinophils Relative: 2 %
HCT: 40.4 % (ref 36.0–46.0)
Hemoglobin: 13.3 g/dL (ref 12.0–15.0)
Lymphocytes Relative: 24 %
Lymphs Abs: 2.6 10*3/uL (ref 0.7–4.0)
MCH: 29.3 pg (ref 26.0–34.0)
MCHC: 32.9 g/dL (ref 30.0–36.0)
MCV: 89 fL (ref 78.0–100.0)
Monocytes Absolute: 1.1 10*3/uL — ABNORMAL HIGH (ref 0.1–1.0)
Monocytes Relative: 10 %
Neutro Abs: 6.9 10*3/uL (ref 1.7–7.7)
Neutrophils Relative %: 64 %
Platelets: 234 10*3/uL (ref 150–400)
RBC: 4.54 MIL/uL (ref 3.87–5.11)
RDW: 12.4 % (ref 11.5–15.5)
WBC: 10.7 10*3/uL — ABNORMAL HIGH (ref 4.0–10.5)

## 2016-10-09 LAB — APTT: aPTT: 25 seconds (ref 24–36)

## 2016-10-09 LAB — PROTIME-INR
INR: 1.01
PROTHROMBIN TIME: 13.3 s (ref 11.4–15.2)

## 2016-10-09 LAB — GLUCOSE, CAPILLARY
GLUCOSE-CAPILLARY: 72 mg/dL (ref 65–99)
GLUCOSE-CAPILLARY: 76 mg/dL (ref 65–99)
GLUCOSE-CAPILLARY: 139 mg/dL — AB (ref 65–99)
Glucose-Capillary: 123 mg/dL — ABNORMAL HIGH (ref 65–99)
Glucose-Capillary: 128 mg/dL — ABNORMAL HIGH (ref 65–99)
Glucose-Capillary: 126 mg/dL — ABNORMAL HIGH (ref 65–99)

## 2016-10-09 LAB — PROCALCITONIN: Procalcitonin: <0.1 ng/mL

## 2016-10-09 MED ORDER — VECURONIUM BROMIDE 10 MG IV SOLR
10.0000 mg | Freq: Once | INTRAVENOUS | Status: AC
  Administered 2016-10-10: 6 mg via INTRAVENOUS

## 2016-10-09 MED ORDER — ETOMIDATE 2 MG/ML IV SOLN: 40.0000 mg | Freq: Once | INTRAVENOUS | Status: DC

## 2016-10-09 MED ORDER — FENTANYL CITRATE (PF) 100 MCG/2ML IJ SOLN
200.0000 ug | Freq: Once | INTRAMUSCULAR | Status: AC
  Administered 2016-10-10: 200 ug via INTRAVENOUS
  Filled 2016-10-09: qty 4

## 2016-10-09 MED ORDER — PROPOFOL 500 MG/50ML IV EMUL
5.0000 ug/kg/min | Freq: Once | INTRAVENOUS | Status: AC
  Administered 2016-10-10: 30 ug/kg/min via INTRAVENOUS

## 2016-10-09 MED ORDER — MIDAZOLAM HCL 2 MG/2ML IJ SOLN
4.0000 mg | Freq: Once | INTRAMUSCULAR | Status: AC
  Administered 2016-10-10: 4 mg via INTRAVENOUS
  Filled 2016-10-09: qty 4

## 2016-10-09 NOTE — Progress Notes (Addendum)
STROKE TEAM PROGRESS NOTE   SUBJECTIVE (INTERVAL HISTORY) Husband is at bedside. Pt still intubated not on sedation, moving more on the right arm and foot, but still in coma. Tmax 101.2 overnight. Recommend early trach and PEG to limit medication related complications.. Discussed with Dr. Tyson Olsen who agreed with the plan.   OBJECTIVE Temp:  [98 F (36.7 C)-103.4 F (39.7 C)] 98 F (36.7 C) (01/30 1600) Pulse Rate:  [76-104] 85 (01/30 1700) Cardiac Rhythm: Normal sinus rhythm (01/30 1600) Resp:  [21-31] 22 (01/30 1700) BP: (111-151)/(73-99) 140/99 (01/30 1700) SpO2:  [98 %-100 %] 100 % (01/30 1700) FiO2 (%):  [30 %] 30 % (01/30 1517) Weight:  [155 lb 3.3 oz (70.4 kg)] 155 lb 3.3 oz (70.4 kg) (01/30 0412)  CBC:   Recent Labs Lab 10/06/16 1123  10/08/16 0406 10/09/16 0305  WBC 16.2*  < > 12.0* 10.7*  NEUTROABS 12.5*  --   --  6.9  HGB 14.3  < > 12.9 13.3  HCT 43.2  < > 40.2 40.4  MCV 89.6  < > 90.5 89.0  PLT 250  < > 237 234  < > = values in this interval not displayed.  Basic Metabolic Panel:   Recent Labs Lab 10/08/16 0406 10/08/16 1658 10/09/16 0305  NA 139  --  138  K 4.4  --  4.1  CL 109  --  108  CO2 23  --  24  GLUCOSE 111*  --  127*  BUN 12  --  9  CREATININE 0.85  --  0.79  CALCIUM 8.1*  --  8.3*  MG 1.9 1.7  --   PHOS 2.2* 2.5  --     Lipid Panel:     Component Value Date/Time   CHOL 182 10/06/2016 0413   TRIG 82 10/06/2016 0413   HDL 63 10/06/2016 0413   CHOLHDL 2.9 10/06/2016 0413   VLDL 16 10/06/2016 0413   LDLCALC 103 (H) 10/06/2016 0413   HgbA1c:  Lab Results  Component Value Date   HGBA1C 5.1 10/06/2016   Urine Drug Screen:     Component Value Date/Time   LABOPIA NONE DETECTED 10/04/2016 2054   COCAINSCRNUR NONE DETECTED 10/04/2016 2054   LABBENZ NONE DETECTED 10/04/2016 2054   AMPHETMU NONE DETECTED 10/04/2016 2054   THCU NONE DETECTED 10/04/2016 2054   LABBARB NONE DETECTED 10/04/2016 2054      IMAGING I have personally  reviewed the radiological images below and agree with the radiology interpretations.  Ct Angio Head and Neck W Or Wo Contrast 10/04/2016 Acute intraparenchymal hemorrhage in the right thalamus with hematoma measuring 6.9 mL. Intraventricular penetration. Small amount of subarachnoid penetration. CTA of the neck and head do not show any atherosclerotic narrowing. The vessels are somewhat tortuous suggesting hypertension. No sign of aneurysm or vascular malformation to explain the right thalamic hemorrhage.   Dg Chest Portable 1 View 10/04/2016 1. Endotracheal tube seen ending 2 cm above the carina.  2. Enteric tube noted ending overlying the body of the stomach, with the side port about the gastroesophageal junction.  3. Vascular congestion and mild cardiomegaly. Mild patchy left-sided airspace opacity may reflect atelectasis or mild interstitial edema.  Ct Head Code Stroke W/o Cm 10/04/2016 Acute intraparenchymal hemorrhage in the right thalamus with hematoma measuring 6.9 mL. Intraventricular penetration. Small amount of subarachnoid penetration. CTA of the neck and head do not show any atherosclerotic narrowing. The vessels are somewhat tortuous suggesting hypertension. No sign of aneurysm or vascular malformation  to explain the right thalamic hemorrhage.   Ct Head W/o Cm 10/05/2016 Decrease in volume of right thalamic hemorrhage. Intraventricular and subarachnoid hemorrhage again noted. Progressive hydrocephalus.  10/07/2016 Right thalamic hemorrhage with subarachnoid and ventricular hemorrhage unchanged. No new hemorrhage Interval placement of right frontal catheter with decrease inventricle size. Ventricle size now normal.  TTE  10/06/2016 Study Conclusions  Left ventricle: The cavity size was normal. Systolic function was normal.  The estimated ejection fraction was in the range of 55% to 60%.  Wall motion was normal; there were no regional wall motion abnormalities. Impressions: No  cardiac source of emboli was indentified.   PHYSICAL EXAM  Temp:  [98 F (36.7 C)-103.4 F (39.7 C)] 98 F (36.7 C) (01/30 1600) Pulse Rate:  [76-104] 85 (01/30 1700) Resp:  [21-31] 22 (01/30 1700) BP: (111-151)/(73-99) 140/99 (01/30 1700) SpO2:  [98 %-100 %] 100 % (01/30 1700) FiO2 (%):  [30 %] 30 % (01/30 1517) Weight:  [155 lb 3.3 oz (70.4 kg)] 155 lb 3.3 oz (70.4 kg) (01/30 0412)  General - Well nourished, well developed, intubated, not on sedation, in coma.  Ophthalmologic - Fundi not visualized.  Cardiovascular - Regular rate and rhythm.  Neuro - intubated, not on sedation, not responsive to voice, in coma. Pupils 2.68mm bilaterally, no pupillary reflex, no corneal reflexes bilaterally, no doll's eyes, eyes mid position. Positive gag and cough, breathing over the vent. On pain stimulation, no movement on LUEs and LLE, but mild withdraw at RLE and brief against gravity of RUE, no babinski, DTR diminished. Sensation, coordination and gait not able to test.    ASSESSMENT/PLAN [name] is a 41 y.o. female with history of hypertension and hypokalemia presenting with headache, elevated blood pressure, nausea, projectile vomiting, and loss of consciousness.  She did not receive IV t-PA due to acute intraparenchymal hemorrhage.   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  2D Echo - EF 55-60%. No cardiac source of emboli identified.  LDL - 103  HgbA1c 5.1  VTE prophylaxis - subq heparin Diet NPO time specified Diet NPO time specified  No antithrombotic prior to admission, now on No antithrombotic secondary to hemorrhage.  On amantadine for arousal - 200mg  bid  Ongoing aggressive stroke risk  factor management  Family OK with trach, PEG, NH placement. Discussed with Dr. Tyson Alias and plan for early treatment pack to limit medication related to medications  Therapy recommendations: pending  Disposition: Pending  Hydrocephalus  Repeat CT showed progressive hydrocephalus  NSG on board  S/p Right frontal ventriculostomy drain - Dr Yetta Barre - 10/05/16  Drainage patent  Repeat CT head much improved hydrocephalus  Hypertension  Home meds - norvasc (missed doses)  Stable  BP goal < 140  Put on lisinopril 20mg  for BP control Long-term BP goal normotensive  Other Stroke Risk Factors  Medical noncompliance  Fever with leukocytosis  101.5 axillary -> 102-> 101.2  WBCs 18.0 -> 16.7 -> 14.5->12.0-> 10.7  On unasyn started Friday, 10/05/2016  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 This patient's care requires constant monitoring of vital signs, hemodynamics, respiratory and cardiac monitoring, review of multiple databases, neurological assessment, discussion with family, other specialists and medical decision making of high complexity. I spent 35 minutes of neurocritical care time in the care of this patient.  Marvel Plan, MD PhD Stroke Neurology 10/09/2016 6:30 PM    To contact Stroke Continuity provider, please refer to [email or web address]. After hours, contact General Neurology

## 2016-10-09 NOTE — Progress Notes (Addendum)
PULMONARY / CRITICAL CARE MEDICINE   Name: Anne Olsen MRN: [id number] DOB: September 24, 1975    ADMISSION DATE:  10/04/2016  REFERRING MD:  Benjiman Core, M.D. / EDP  CHIEF COMPLAINT:  Acute Intracranial Hemorrhage  BRIEF SUMMARY:  41 y.o. female pmhx of HTN who presented 1/25 to [facility] via EMS for headache, weakness, and AMS. History was provided entirely by the husband [place]. 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 He got home from work around 5 pm when she developed a sudden headache and screamed out loud. Patient told him she had never had a headache like this before. She developed weakness on her left side and became very sleepy. Husband said he tried feeding her and putting her to bed, but she became more and more lethargic and he called 911.   On arrival to the ED, blood pressure was 161/132. Pupils were noted to be unequal. She was very lethargic on non rebreather. She subsequently became unresponsive and was intubated for airway protection and started on propofol for sedation. Repeat blood pressure was 196/95. CT head revealed an acute intraparenchymal hemorrhage in the right thalamus with a hematoma measuring 6.9 mL. Per husband, patient's only other medical problems include issues with high potassium. She started taking birth control pills about 2 months ago. She exercises regularly and is otherwise healthy. Husband is unaware of hypertension or aneurysms that run in her family.    SUBJECTIVE: has fever, possible follow commands win spanish, cant replicate   VITAL SIGNS: BP (!) 142/90   Pulse 88   Temp (!) 103.4 F (39.7 C) (Axillary)   Resp (!) 28   Ht 5\' 1"  (1.549 m)   Wt 70.4 kg (155 lb 3.3 oz)   LMP  (LMP Unknown)   SpO2 100%   BMI 29.33 kg/m   HEMODYNAMICS:    VENTILATOR SETTINGS: Vent Mode: PRVC FiO2 (%):  [30 %] 30 % Set Rate:  [22 bmp] 22 bmp Vt Set:  [380 mL] 380 mL PEEP:  [5 cmH20] 5  cmH20 Pressure Support:  [8 cmH20] 8 cmH20 Plateau Pressure:  [13 cmH20-14 cmH20] 13 cmH20  INTAKE / OUTPUT: I/O last 3 completed shifts: In: 2470.5 [I.V.:935.5; NG/GT:1185; IV Piggyback:350] Out: 4098 [JXBJY:7829; Drains:398]  PHYSICAL EXAMINATION: General: young adult female, appears critically ill in NAD HEENT: drain clean, ett  Neuro: flacid on left , some WD on left, not fc for me CV: s1s2 rrr, no m/r/g PULM: CTA FA:OZHY, non-tender, bsx4 active  Extremities: warm/dry, no edema  Skin: no rashes or lesions    LABS: CBC Latest Ref Rng & Units 10/09/2016 10/08/2016 10/07/2016  WBC 4.0 - 10.5 K/uL 10.7(H) 12.0(H) 14.5(H)  Hemoglobin 12.0 - 15.0 g/dL 86.5 78.4 69.6  Hematocrit 36.0 - 46.0 % 40.4 40.2 42.7  Platelets 150 - 400 K/uL 234 237 228   BMP Latest Ref Rng & Units 10/09/2016 10/08/2016 10/07/2016  Glucose 65 - 99 mg/dL 295(M) 841(L) 244(W)  BUN 6 - 20 mg/dL 9 12 12   Creatinine 0.44 - 1.00 mg/dL 1.02 7.25 3.66  Sodium 135 - 145 mmol/L 138 139 138  Potassium 3.5 - 5.1 mmol/L 4.1 4.4 5.0  Chloride 101 - 111 mmol/L 108 109 108  CO2 22 - 32 mmol/L 24 23 21(L)  Calcium 8.9 - 10.3 mg/dL 8.3(L) 8.1(L) 8.2(L)     STUDIES:  CT HEAD CODE STROKE 1/25 >  Acute intraparenchymal hemorrhage in the right thalamus with hematoma measuring  6.9 mL. Intraventricular penetration. Small amount of subarachnoid penetration.  CTA of the neck and head 1/25 > do not show any atherosclerotic narrowing. The vessels are somewhat tortuous suggesting hypertension. No sign of aneurysm or vascular malformation to explain the right thalamic hemorrhage.  PORT CXR 1/26 > Personally reviewed by me.Minimal patchy right lower lobe opacity. No other parenchymal infiltrate appreciated. No pleural effusion. Endotracheal tube in good position. Enteric feeding tube coursing below diaphragm.  CT HEAD 1/26 > decreased volume of right thalamic hemorrhage, intraventricular and subarachnoid hemorrhage, progressive  hydrocephalus  MICROBIOLOGY: MRSA PCR 1/25:  Negative  ANTIBIOTICS: Unasyn 1/26 >>   SIGNIFICANT EVENTS: 1/25 - Admit with AMS, intubated  1/26 - IVC drain placed   LINES/TUBES: OETT 7.5 1/24 >> OGT 1/24 >> FOLEY 1/24 >> PIV  ASSESSMENT / PLAN:  [age] y.o. female status post acute intraparenchymal hemorrhage in the right thalamus with intraventricular penetration as well as a small amount of penetration into the subarachnoid space.  1. Right Intraparenchymal Brain Hemorrhage - neuro assessments, IVC drain per NS 2. Acute Respiratory Failure - pcxr without infiltrate, weaning attempts cpap 5 ps5, goal 2 hours, NO role extubation as poor airway protection, will proceed with early trach wed, pcxr not needed in am  3. Fever, pct neg, pcxr neg, unasyn , consider 5 days, no esclation needed for fevers l, likely neuro related 4. - add coiling blanket, may need artic sun 5. Hyperkalemia resolved chem in am  6. Leukocytosis - likely reactive, improved 7. Hyperglycemia -  controlled 8. At Risk Protein Calorie Malnutrition -tf to goal,add mir, needs peg, I will call trauma in 24-48 hrs to do 9. Prophylaxis - SCD's only, no heparin with ICH.  Pepcid Q12h  Ccm time 30 min  I udpated husband  Mcarthur RossettiDaniel J. Tyson AliasFeinstein, MD, FACP Pgr: [phone] [facility]

## 2016-10-09 NOTE — Progress Notes (Signed)
RT note: ETT pulled back 1.5cm per MD order.  Tolerated well.

## 2016-10-10 ENCOUNTER — Encounter (HOSPITAL_COMMUNITY): Payer: 59

## 2016-10-10 ENCOUNTER — Inpatient Hospital Stay (HOSPITAL_COMMUNITY): Payer: 59

## 2016-10-10 ENCOUNTER — Encounter (HOSPITAL_COMMUNITY)
Admission: EM | Disposition: A | Discharge status: Skilled Nursing Facility | Payer: Self-pay | Source: Home / Self Care | Attending: Internal Medicine

## 2016-10-10 DIAGNOSIS — I615 Nontraumatic intracerebral hemorrhage, intraventricular: Secondary | ICD-10-CM

## 2016-10-10 HISTORY — PX: PEG PLACEMENT: SHX5437

## 2016-10-10 HISTORY — PX: ESOPHAGOGASTRODUODENOSCOPY: SHX5428

## 2016-10-10 LAB — GLUCOSE, CAPILLARY
GLUCOSE-CAPILLARY: 70 mg/dL (ref 65–99)
GLUCOSE-CAPILLARY: 57 mg/dL — AB (ref 65–99)
Glucose-Capillary: 77 mg/dL (ref 65–99)
Glucose-Capillary: 95 mg/dL (ref 65–99)
Glucose-Capillary: 110 mg/dL — ABNORMAL HIGH (ref 65–99)
Glucose-Capillary: 109 mg/dL — ABNORMAL HIGH (ref 65–99)
Glucose-Capillary: 93 mg/dL (ref 65–99)
Glucose-Capillary: 109 mg/dL — ABNORMAL HIGH (ref 65–99)

## 2016-10-10 LAB — PROCALCITONIN

## 2016-10-10 SURGERY — EGD (ESOPHAGOGASTRODUODENOSCOPY)
Anesthesia: Moderate Sedation

## 2016-10-10 MED ORDER — DEXTROSE 50 % IV SOLN
1.0000 | Freq: Once | INTRAVENOUS | Status: AC
  Administered 2016-10-10: 50 mL via INTRAVENOUS

## 2016-10-10 MED ORDER — FENTANYL CITRATE (PF) 100 MCG/2ML IJ SOLN
100.0000 ug | Freq: Once | INTRAMUSCULAR | Status: AC
  Administered 2016-10-10: 100 ug via INTRAVENOUS

## 2016-10-10 MED ORDER — MIDAZOLAM HCL 2 MG/2ML IJ SOLN
INTRAMUSCULAR | Status: AC
  Filled 2016-10-10: qty 2

## 2016-10-10 MED ORDER — WHITE PETROLATUM GEL
Status: AC
  Administered 2016-10-10: 02:00:00
  Filled 2016-10-10: qty 1

## 2016-10-10 MED ORDER — DEXTROSE 50 % IV SOLN
INTRAVENOUS | Status: AC
  Filled 2016-10-10: qty 50

## 2016-10-10 MED ORDER — MIDAZOLAM HCL 2 MG/2ML IJ SOLN
4.0000 mg | Freq: Once | INTRAMUSCULAR | Status: AC
  Administered 2016-10-10: 4 mg via INTRAVENOUS

## 2016-10-10 MED ORDER — FENTANYL CITRATE (PF) 100 MCG/2ML IJ SOLN: 25.0000 ug | INTRAMUSCULAR | Status: AC | PRN

## 2016-10-10 MED ORDER — VECURONIUM BROMIDE 10 MG IV SOLR
INTRAVENOUS | Status: AC
  Filled 2016-10-10: qty 10

## 2016-10-10 NOTE — Progress Notes (Addendum)
PULMONARY / CRITICAL CARE MEDICINE   Name: Anne Olsen MRN: [id number] DOB: September 03, 1976     ADMISSION DATE:  10/04/2016  REFERRING MD:  Benjiman CoreNathan Pickering, M.D. / EDP  CHIEF COMPLAINT:  Acute Intracranial Hemorrhage  BRIEF SUMMARY:  41 y.o. female pmhx of HTN who [facility] for headache, weakness, and AMS. History was provided entirely by the husband ChatomMohammad. 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 He got home from work around 5 pm when she developed a sudden headache and screamed out loud. Patient told him she had never had a headache like this before. She developed weakness on her left side and became very sleepy. Husband said he tried feeding her and putting her to bed, but she became more and more lethargic and he called 911.   On arrival to the ED, blood pressure was 161/132. Pupils were noted to be unequal. She was very lethargic on non rebreather. She subsequently became unresponsive and was intubated for airway protection and started on propofol for sedation. Repeat blood pressure was 196/95. CT head revealed an acute intraparenchymal hemorrhage in the right thalamus with a hematoma measuring 6.9 mL. Per husband, patient's only other medical problems include issues with high potassium. She started taking birth control pills about 2 months ago. She exercises regularly and is otherwise healthy. Husband is unaware of hypertension or aneurysms that run in her family.    SUBJECTIVE:  Remains febrile. For trach today. Tol SBT all day yesterday, mental status still does not support extubation.    VITAL SIGNS: BP 129/86   Pulse 88   Temp (!) 103.1 F (39.5 C) (Rectal) Comment: 650 mg tylenol given for fever  Resp (!) 25   Ht 5\' 1"  (1.549 m)   Wt 72.8 kg (160 lb 7.9 oz)   LMP  (LMP Unknown)   SpO2 100%   BMI 30.33 kg/m   HEMODYNAMICS:    VENTILATOR SETTINGS: Vent Mode: PRVC FiO2 (%):  [30 %] 30  % Set Rate:  [22 bmp] 22 bmp Vt Set:  [380 mL] 380 mL PEEP:  [5 cmH20] 5 cmH20 Pressure Support:  [5 cmH20] 5 cmH20 Plateau Pressure:  [10 cmH20-13 cmH20] 12 cmH20  INTAKE / OUTPUT: I/O last 3 completed shifts: In: 3380 [I.V.:380; NG/GT:2100; IV Piggyback:900] Out: 2919 [Urine:2535; Drains:384]  PHYSICAL EXAMINATION:  General:   Young female, NAD  HEENT: MM pink/moist, ETT Neuro:  Minimal response, grimace to pain, does not follow commands, flaccid L CV: s1s2 rrr, no m/r/g PULM: even/non-labored, lungs bilaterally diminished bases WU:JWJXGI:soft, non-tender, bsx4 active  Extremities: warm/dry, scant BLE edema  Skin: no rashes or lesions     LABS: CBC Latest Ref Rng & Units 10/09/2016 10/08/2016 10/07/2016  WBC 4.0 - 10.5 K/uL 10.7(H) 12.0(H) 14.5(H)  Hemoglobin 12.0 - 15.0 g/dL 91.413.3 78.212.9 95.614.0  Hematocrit 36.0 - 46.0 % 40.4 40.2 42.7  Platelets 150 - 400 K/uL 234 237 228   BMP Latest Ref Rng & Units 10/09/2016 10/08/2016 10/07/2016  Glucose 65 - 99 mg/dL 213(Y127(H) 865(H111(H) 846(N103(H)  BUN 6 - 20 mg/dL 9 12 12   Creatinine 0.44 - 1.00 mg/dL 6.290.79 5.280.85 4.130.85  Sodium 135 - 145 mmol/L 138 139 138  Potassium 3.5 - 5.1 mmol/L 4.1 4.4 5.0  Chloride 101 - 111 mmol/L 108 109 108  CO2 22 - 32 mmol/L 24 23 21(L)  Calcium 8.9 - 10.3 mg/dL 8.3(L) 8.1(L) 8.2(L)  STUDIES:  CT HEAD CODE STROKE 1/25 >  Acute intraparenchymal hemorrhage in the right thalamus with hematoma measuring 6.9 mL. Intraventricular penetration. Small amount of subarachnoid penetration.  CTA of the neck and head 1/25 > do not show any atherosclerotic narrowing. The vessels are somewhat tortuous suggesting hypertension. No sign of aneurysm or vascular malformation to explain the right thalamic hemorrhage.  PORT CXR 1/26 > Personally reviewed by me.Minimal patchy right lower lobe opacity. No other parenchymal infiltrate appreciated. No pleural effusion. Endotracheal tube in good position. Enteric feeding tube coursing below  diaphragm.  CT HEAD 1/26 > decreased volume of right thalamic hemorrhage, intraventricular and subarachnoid hemorrhage, progressive hydrocephalus  MICROBIOLOGY: MRSA PCR 1/25:  Negative  ANTIBIOTICS: Unasyn 1/26 >>   SIGNIFICANT EVENTS: 1/25 - Admit with AMS, intubated  1/26 - IVC drain placed   LINES/TUBES: OETT 7.5 1/24 >> FOLEY 1/24 >> PIV  ASSESSMENT / PLAN:  41 y.o. female status post acute intraparenchymal hemorrhage in the right thalamus with intraventricular penetration as well as a small amount of penetration into the subarachnoid space.  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 No obvious infiltrate  Plan -  Daily SBT - mental status does not support extubation  Plan trach 1/31 F/u CXR   Fever - suspect neuro related. WBC trending down.  Plan -  5 days unasyn as above  Continue cooling blanket   Hyperkalemia - resolved  Plan -  F/u chem   At risk protein calorie malnutrition  Plan -  Cont TF post trach  Needs PEG  pepcid    Prophylaxis - SCD's only, no heparin with ICH.  Pepcid Q12h  Husband updated at bedside 1/31.    Cc time 35 mins    Dirk Dress, NP 10/10/2016  9:14 AM Pager: [phone] or [phone]  STAFF NOTE: I, Rory Percy, MD FACP have personally reviewed patient's available data, including medical history, events of note, physical examination and test results as part of my evaluation. I have discussed with resident/NP and other care providers such as pharmacist, RN and RRT. In addition, I personally evaluated patient and elicited key findings of: not fc this am , some delayed wiggling toes per RN with spanish interpretor, lungs CTA, fever curve is lower, coags wnl for trach today at 11 am, hope early trach will help her liberate faster from vent and continued to offer airway patency, goal will be  cpa 5 ps5, goal 30 min then trach collar for 15 min post trach and close observation rr with brainstem involvement even balance, may need lasix in am, fever is neuro likely, pCT neg x 2, re assuring, will also be able to assess bronch findings during trach, will update husband, unasyn x 5 days then dc unless bronch assessent changes this suspcion The patient is critically ill with multiple organ systems failure and requires high complexity decision making for assessment and support, frequent evaluation and titration of therapies, application of advanced monitoring technologies and extensive interpretation of multiple databases.   Critical Care Time devoted to patient care services described in this note is35 Minutes. This time reflects time of care of this signee: Rory Percy, MD FACP. This critical care time does not reflect procedure time, or teaching time or supervisory time of PA/NP/Med student/Med Resident etc but could involve care discussion time. Rest per NP/medical  resident whose note is outlined above and that I agree with   Mcarthur Rossetti. Tyson Alias, MD, FACP Pgr: [phone] [place] [facility] 10/10/2016 9:22 AM

## 2016-10-10 NOTE — Progress Notes (Signed)
Right wrist  Restraint started due to pt attempting to hold on ETT with green mitten.[place]. Will continue to monitor

## 2016-10-10 NOTE — Progress Notes (Signed)
eLink Physician-Brief Progress Note Patient Name: Anne JerichoViviana Heeney DOB: Jan 17, 1976 MRN: [id number]   Date of Service  10/10/2016  HPI/Events of Note  Agitation - Request for soft R wrist restraint.  eICU Interventions  Will order soft R wrist restraint.     Intervention Category Minor Interventions: Agitation / anxiety - evaluation and management  Ludmilla Mcgillis Eugene 10/10/2016, 2:14 AM

## 2016-10-10 NOTE — Progress Notes (Signed)
eLink Physician-Brief Progress Note Patient Name: Anne JerichoViviana Olsen DOB: 1975/11/10 MRN: [id number]   Date of Service  10/10/2016  HPI/Events of Note  Request to renew R wrist restraint.   eICU Interventions  Will renew R wrist restraint.     Intervention Category Minor Interventions: Agitation / anxiety - evaluation and management  Lenell AntuSommer,Steven Eugene 10/10/2016, 11:48 PM

## 2016-10-10 NOTE — Consult Note (Signed)
[facility] Surgery Consult Note  Anne Olsen March 06, 1976  [id number].    Requesting MD: Titus Mould, MD Chief Complaint/Reason for Consult: PEG placement  HPI:   41 y.o. female pmhx of HTN and hypokalemia who presented 1/25 to [facility] via EMS for headache, weakness, and AMS. On arrival to the ED, blood pressure was 161/132. Pupils were noted to be unequal. She was very lethargic on non rebreather. She subsequently became unresponsive and was intubated for airway protection and started on propofol for sedation. Repeat blood pressure was 196/95. CT head revealed an acute intraparenchymal hemorrhage in the right thalamus with a hematoma measuring 6.9 mL. She was admitted to the critical care service for further management and neurosurgery consult. The patient remains intubated today 2/2 inability to protect her airway in the setting of ICH. Trauma has been asked to place a PEG tube fto prevent protein calorie malnutrition.   ROS: Unable to obtain due to mental status.  History reviewed. No pertinent family history.  Past Medical History:  Diagnosis Date  . Hyperkalemia   . Hypertension     History reviewed. No pertinent surgical history.  Social History:  reports that she has never smoked. She has never used smokeless tobacco. Her alcohol and drug histories are not on file.  Allergies: No Known Allergies  No prescriptions prior to admission.    Blood pressure (!) 151/98, pulse 88, temperature 99.8 F (37.7 C), temperature source Axillary, resp. rate (!) 25, height '5\' 1"'$  (1.549 m), weight 72.8 kg (160 lb 7.9 oz), SpO2 100 %. Physical Exam: General: young adult female, intubated, NAD HEENT: ETT, drain Heart: regular, rate, and rhythm.  No obvious murmurs, gallops, or rubs noted.  Palpable pedal pulses bilaterally Lungs: CTAB, no wheezes, rhonchi, or rales noted.  Abd: soft, NT/ND, bowel sounds present, no masses, hernias, or organomegaly MS: all 4 extremities are symmetrical  with no cyanosis, clubbing, or edema. Skin: warm and dry with no masses, lesions, or rashes Neuro: not f/c; spontaneously moving right lower extremity.   Results for orders placed or performed during the hospital encounter of 10/04/16 (from the past 48 hour(s))  Glucose, capillary     Status: None   Collection Time: 10/08/16 11:33 AM  Result Value Ref Range   Glucose-Capillary 95 65 - 99 mg/dL   Comment 1 Notify RN    Comment 2 Document in Chart   Glucose, capillary     Status: None   Collection Time: 10/08/16  3:53 PM  Result Value Ref Range   Glucose-Capillary 97 65 - 99 mg/dL   Comment 1 Notify RN    Comment 2 Document in Chart   Magnesium     Status: None   Collection Time: 10/08/16  4:58 PM  Result Value Ref Range   Magnesium 1.7 1.7 - 2.4 mg/dL  Phosphorus     Status: None   Collection Time: 10/08/16  4:58 PM  Result Value Ref Range   Phosphorus 2.5 2.5 - 4.6 mg/dL  Glucose, capillary     Status: Abnormal   Collection Time: 10/08/16  7:27 PM  Result Value Ref Range   Glucose-Capillary 143 (H) 65 - 99 mg/dL  Glucose, capillary     Status: None   Collection Time: 10/08/16 11:36 PM  Result Value Ref Range   Glucose-Capillary 88 65 - 99 mg/dL  Procalcitonin     Status: None   Collection Time: 10/09/16  3:05 AM  Result Value Ref Range   Procalcitonin <0.10 ng/mL  Comment:        Interpretation: PCT (Procalcitonin) <= 0.5 ng/mL: Systemic infection (sepsis) is not likely. Local bacterial infection is possible. (NOTE)         ICU PCT Algorithm               Non ICU PCT Algorithm    ----------------------------     ------------------------------         PCT < 0.25 ng/mL                 PCT < 0.1 ng/mL     Stopping of antibiotics            Stopping of antibiotics       strongly encouraged.               strongly encouraged.    ----------------------------     ------------------------------       PCT level decrease by               PCT < 0.25 ng/mL       >= 80% from  peak PCT       OR PCT 0.25 - 0.5 ng/mL          Stopping of antibiotics                                             encouraged.     Stopping of antibiotics           encouraged.    ----------------------------     ------------------------------       PCT level decrease by              PCT >= 0.25 ng/mL       < 80% from peak PCT        AND PCT >= 0.5 ng/mL            Continuin g antibiotics                                              encouraged.       Continuing antibiotics            encouraged.    ----------------------------     ------------------------------     PCT level increase compared          PCT > 0.5 ng/mL         with peak PCT AND          PCT >= 0.5 ng/mL             Escalation of antibiotics                                          strongly encouraged.      Escalation of antibiotics        strongly encouraged.   Basic metabolic panel     Status: Abnormal   Collection Time: 10/09/16  3:05 AM  Result Value Ref Range   Sodium 138 135 - 145 mmol/L   Potassium 4.1 3.5 - 5.1 mmol/L   Chloride 108 101 - 111 mmol/L   CO2 24 22 -  32 mmol/L   Glucose, Bld 127 (H) 65 - 99 mg/dL   BUN 9 6 - 20 mg/dL   Creatinine, Ser 0.79 0.44 - 1.00 mg/dL   Calcium 8.3 (L) 8.9 - 10.3 mg/dL   GFR calc non Af Amer >60 >60 mL/min   GFR calc Af Amer >60 >60 mL/min    Comment: (NOTE) The eGFR has been calculated using the CKD EPI equation. This calculation has not been validated in all clinical situations. eGFR's persistently <60 mL/min signify possible Chronic Kidney Disease.    Anion gap 6 5 - 15  CBC with Differential/Platelet     Status: Abnormal   Collection Time: 10/09/16  3:05 AM  Result Value Ref Range   WBC 10.7 (H) 4.0 - 10.5 K/uL   RBC 4.54 3.87 - 5.11 MIL/uL   Hemoglobin 13.3 12.0 - 15.0 g/dL   HCT 40.4 36.0 - 46.0 %   MCV 89.0 78.0 - 100.0 fL   MCH 29.3 26.0 - 34.0 pg   MCHC 32.9 30.0 - 36.0 g/dL   RDW 12.4 11.5 - 15.5 %   Platelets 234 150 - 400 K/uL   Neutrophils  Relative % 64 %   Neutro Abs 6.9 1.7 - 7.7 K/uL   Lymphocytes Relative 24 %   Lymphs Abs 2.6 0.7 - 4.0 K/uL   Monocytes Relative 10 %   Monocytes Absolute 1.1 (H) 0.1 - 1.0 K/uL   Eosinophils Relative 2 %   Eosinophils Absolute 0.2 0.0 - 0.7 K/uL   Basophils Relative 0 %   Basophils Absolute 0.0 0.0 - 0.1 K/uL  Glucose, capillary     Status: Abnormal   Collection Time: 10/09/16  3:18 AM  Result Value Ref Range   Glucose-Capillary 123 (H) 65 - 99 mg/dL  Glucose, capillary     Status: None   Collection Time: 10/09/16  7:39 AM  Result Value Ref Range   Glucose-Capillary 72 65 - 99 mg/dL  Glucose, capillary     Status: Abnormal   Collection Time: 10/09/16 11:18 AM  Result Value Ref Range   Glucose-Capillary 128 (H) 65 - 99 mg/dL   Comment 1 Notify RN    Comment 2 Document in Chart   Glucose, capillary     Status: Abnormal   Collection Time: 10/09/16  3:39 PM  Result Value Ref Range   Glucose-Capillary 126 (H) 65 - 99 mg/dL  Glucose, capillary     Status: None   Collection Time: 10/09/16  7:56 PM  Result Value Ref Range   Glucose-Capillary 76 65 - 99 mg/dL  Glucose, capillary     Status: Abnormal   Collection Time: 10/09/16 11:02 PM  Result Value Ref Range   Glucose-Capillary 139 (H) 65 - 99 mg/dL  Protime-INR     Status: None   Collection Time: 10/09/16 11:06 PM  Result Value Ref Range   Prothrombin Time 13.3 11.4 - 15.2 seconds   INR 1.01   APTT     Status: None   Collection Time: 10/09/16 11:06 PM  Result Value Ref Range   aPTT 25 24 - 36 seconds  Glucose, capillary     Status: None   Collection Time: 10/10/16  3:11 AM  Result Value Ref Range   Glucose-Capillary 77 65 - 99 mg/dL  Glucose, capillary     Status: None   Collection Time: 10/10/16  7:42 AM  Result Value Ref Range   Glucose-Capillary 95 65 - 99 mg/dL   Comment 1 Notify RN  Comment 2 Document in Chart    Dg Chest Port 1 View  Result Date: 10/09/2016 CLINICAL DATA:  Acute respiratory failure. EXAM:  PORTABLE CHEST 1 VIEW COMPARISON:  Yesterday FINDINGS: The endotracheal tube tip is 9 mm above the carina. An orogastric tube reaches the stomach. There is no edema, consolidation, effusion, or pneumothorax. Normal heart size and mediastinal contours. IMPRESSION: 1. Endotracheal tube tip is 9 mm above the carina. 2. No evidence of active cardiopulmonary disease. Electronically Signed   By: Monte Fantasia M.D.   On: 10/09/2016 08:10   Assessment/Plan At risk protein calorie malnutrition in the setting of acute right Intraparenchymal hemorrhage - PEG placement today 10/10/16 by Dr. Judeth Horn at 29  - consent obtained from patients husband   Acute respiratory failure  Fever - suspect neuro related Hyperkalemia Leukocytosis - improved, on Unasyn Hyperglycemia  VTE: SCD's  GI proph: [place], [facility] Surgery 10/10/2016, 10:28 AM Pager: [phone] Consults: [phone] Mon-Fri 7:00 am-4:30 pm Sat-Sun 7:00 am-11:30 am

## 2016-10-10 NOTE — Procedures (Signed)
Name:  Anne Olsen MRN:  [id number] DOB:  [birth year]  OPERATIVE NOTE  Procedure:  Percutaneous tracheostomy.  Indications:  Ventilator-dependent respiratory failure.  Consent:  Procedure, alternatives, risks and benefits discussed with medical POA.  Questions answered.  Consent obtained.  Anesthesia:  Prop, vec, versed, fent  Procedure summary:  Appropriate equipment was assembled.  The patient was identified as Museum/gallery curatorViviana Misch and safety timeout was performed. The patient was placed in supine position with a towel roll behind shoulder blades and neck extended.  Sterile technique was used. The patient's neck and upper chest were prepped using chlorhexidine / alcohol scrub and the field was draped in usual sterile fashion with full body drape. After the adequate sedation / anesthesia was achieved, attention was directed at the midline trachea, where the cricothyroid membrane was palpated. Approximately two fingerbreadths above the sternal notch, a verticle  incision was created with a scalpel after local infiltration with 0.2% Lidocaine. Then, using Seldinger technique and a percutaneous tracheostomy set, the trachea was entered with a 14 gauge needle with an overlying sheath. This was all confirmed under direct visualization of a fiberoptic flexible bronchoscope. Entrance into the trachea was identified through the third tracheal ring interspace. Following this, a guidewire was inserted. The needle was removed, leaving the sheath and the guidewire intact. Next, the sheath was removed and a small dilator was inserted. The tracheal rings were then dilated. A #6 Shiley was then opened. The balloon was checked. It was placed over a tracheal dilator, which was then advanced over the guidewire and through the previously dilated tract. The Shiley tracheostomy tube was noted to pass in the trachea with little resistance. The guidewire and dilator tubes were removed from the trachea. An inner cannula was  placed through the tracheostomy tube. The tracheostomy was then secured at the anterior neck with 4 monofilament sutures. The oral endotracheal tube was removed and the ventilator was attached to the newly placed tracheostomy tube. Adequate tidal volumes were noted. The cuff was inflated and no evidence of air leak was noted. No evidence of bleeding was noted. At this point, the procedure was concluded. Post-procedure chest x-ray was ordered.  Complications:  No immediate complications were noted.  Hemodynamic parameters and oxygenation remained stable throughout the procedure.  Estimated blood loss:  Less then 3 mL.  Nelda BucksFEINSTEIN,DANIEL J., MD Pulmonary and Critical Care Medicine [facility] Pager: [phone]  10/10/2016, 11:33 AM   [facility] [phone]

## 2016-10-10 NOTE — Progress Notes (Signed)
CBG @ 1523 70.  Restarted TF.  CBG @ 1623 57.  1 amp D50 given at this time.  Will recheck CBG when time and will continue to monitor pt.

## 2016-10-10 NOTE — Progress Notes (Signed)
Patient ID: Ricardo JerichoViviana Olsen, female   DOB: 09-27-1975, 41 y.o.   MRN: [id number] Patient received tracheostomy today. Ventriculostomy patent. Her nurses report that she will intermittently follow commands on the right side.

## 2016-10-10 NOTE — Progress Notes (Signed)
Bedside EEG completed, results pending. 

## 2016-10-10 NOTE — Procedures (Signed)
History: 41 year old female with large thalamic hemorrhage  Sedation: Fentanyl and propofol  Technique: This is a 21 channel routine scalp EEG performed at the bedside with bipolar and monopolar montages arranged in accordance to the international 10/20 system of electrode placement. One channel was dedicated to EKG recording.    Background: There is a posterior dominant rhythm of 9 Hz which appears relatively well organized. This is only present briefly, and quickly replaced with drowsiness and sleep. The predominance of the study occurs during sleep. During wakefulness, there does appear to be a slight predominance of irregular delta on the right side, but there is irregular delta diffusely.  Photic stimulation: Physiologic driving is not performed  EEG Abnormalities: 1) focal prominence of irregular slow activity in the right central region 2) generalized irregular delta activity  Clinical Interpretation: This EEG is consistent with a focal area of cerebral dysfunction in the right hemisphere superimposed on a generalized nonspecific cerebral dysfunction as can be seen with medication effect among other causes. Please note that a normal EEG does not preclude the possibility of epilepsy.   Ritta SlotMcNeill Marsh Heckler, MD [facility] [phone]  If 7pm- 7am, please page neurology on call as listed in AMION.

## 2016-10-10 NOTE — Progress Notes (Signed)
STROKE TEAM PROGRESS NOTE   SUBJECTIVE (INTERVAL HISTORY) Husband is at bedside. Pt still intubated not on sedation, moving more on the right arm and foot. Nurse said with spanish interpretor she can wiggle toes and stick out of her tongue on command. But she did not follow my commands. Trach scheduled for today and PEG tomorrow.    OBJECTIVE Temp:  [98 F (36.7 C)-104 F (40 C)] 98.7 F (37.1 C) (01/31 1200) Pulse Rate:  [78-104] 82 (01/31 1400) Cardiac Rhythm: Normal sinus rhythm (01/31 0800) Resp:  [18-31] 19 (01/31 1400) BP: (114-153)/(76-99) 140/86 (01/31 1415) SpO2:  [96 %-100 %] 100 % (01/31 1400) FiO2 (%):  [30 %-100 %] 100 % (01/31 1142) Weight:  [160 lb 7.9 oz (72.8 kg)] 160 lb 7.9 oz (72.8 kg) (01/31 0133)  CBC:   Recent Labs Lab 10/06/16 1123  10/08/16 0406 10/09/16 0305  WBC 16.2*  < > 12.0* 10.7*  NEUTROABS 12.5*  --   --  6.9  HGB 14.3  < > 12.9 13.3  HCT 43.2  < > 40.2 40.4  MCV 89.6  < > 90.5 89.0  PLT 250  < > 237 234  < > = values in this interval not displayed.  Basic Metabolic Panel:   Recent Labs Lab 10/08/16 0406 10/08/16 1658 10/09/16 0305  NA 139  --  138  K 4.4  --  4.1  CL 109  --  108  CO2 23  --  24  GLUCOSE 111*  --  127*  BUN 12  --  9  CREATININE 0.85  --  0.79  CALCIUM 8.1*  --  8.3*  MG 1.9 1.7  --   PHOS 2.2* 2.5  --     Lipid Panel:     Component Value Date/Time   CHOL 182 10/06/2016 0413   TRIG 82 10/06/2016 0413   HDL 63 10/06/2016 0413   CHOLHDL 2.9 10/06/2016 0413   VLDL 16 10/06/2016 0413   LDLCALC 103 (H) 10/06/2016 0413   HgbA1c:  Lab Results  Component Value Date   HGBA1C 5.1 10/06/2016   Urine Drug Screen:     Component Value Date/Time   LABOPIA NONE DETECTED 10/04/2016 2054   COCAINSCRNUR NONE DETECTED 10/04/2016 2054   LABBENZ NONE DETECTED 10/04/2016 2054   AMPHETMU NONE DETECTED 10/04/2016 2054   THCU NONE DETECTED 10/04/2016 2054   LABBARB NONE DETECTED 10/04/2016 2054      IMAGING I  have personally reviewed the radiological images below and agree with the radiology interpretations.  Ct Angio Head and Neck W Or Wo Contrast 10/04/2016 Acute intraparenchymal hemorrhage in the right thalamus with hematoma measuring 6.9 mL. Intraventricular penetration. Small amount of subarachnoid penetration. CTA of the neck and head do not show any atherosclerotic narrowing. The vessels are somewhat tortuous suggesting hypertension. No sign of aneurysm or vascular malformation to explain the right thalamic hemorrhage.   Dg Chest Portable 1 View 10/04/2016 1. Endotracheal tube seen ending 2 cm above the carina.  2. Enteric tube noted ending overlying the body of the stomach, with the side port about the gastroesophageal junction.  3. Vascular congestion and mild cardiomegaly. Mild patchy left-sided airspace opacity may reflect atelectasis or mild interstitial edema.  Ct Head Code Stroke W/o Cm 10/04/2016 Acute intraparenchymal hemorrhage in the right thalamus with hematoma measuring 6.9 mL. Intraventricular penetration. Small amount of subarachnoid penetration. CTA of the neck and head do not show any atherosclerotic narrowing. The vessels are somewhat tortuous suggesting hypertension.  No sign of aneurysm or vascular malformation to explain the right thalamic hemorrhage.   Ct Head W/o Cm 10/05/2016 Decrease in volume of right thalamic hemorrhage. Intraventricular and subarachnoid hemorrhage again noted. Progressive hydrocephalus.  10/07/2016 Right thalamic hemorrhage with subarachnoid and ventricular hemorrhage unchanged. No new hemorrhage Interval placement of right frontal catheter with decrease inventricle size. Ventricle size now normal.  TTE  10/06/2016 Study Conclusions  Left ventricle: The cavity size was normal. Systolic function was normal.  The estimated ejection fraction was in the range of 55% to 60%.  Wall motion was normal; there were no regional wall motion  abnormalities. Impressions: No cardiac source of emboli was indentified.   PHYSICAL EXAM  Temp:  [98 F (36.7 C)-104 F (40 C)] 98.7 F (37.1 C) (01/31 1200) Pulse Rate:  [78-104] 82 (01/31 1400) Resp:  [18-31] 19 (01/31 1400) BP: (114-153)/(76-99) 140/86 (01/31 1415) SpO2:  [96 %-100 %] 100 % (01/31 1400) FiO2 (%):  [30 %-100 %] 100 % (01/31 1142) Weight:  [160 lb 7.9 oz (72.8 kg)] 160 lb 7.9 oz (72.8 kg) (01/31 0133)  General - Well nourished, well developed, intubated, not on sedation, in coma.  Ophthalmologic - Fundi not visualized.  Cardiovascular - Regular rate and rhythm.  Neuro - intubated, not on sedation, not responsive to voice, in coma. Pupils 2.77mm bilaterally, no pupillary reflex, no corneal reflexes bilaterally, no doll's eyes, eyes mid position. Positive gag and cough, breathing over the vent. On pain stimulation, no movement on LUE and LLE, but mild withdraw at RLE and RUE, no babinski, DTR diminished. Sensation, coordination and gait not able to test.    ASSESSMENT/PLAN [name] is a 41 y.o. female with history of hypertension and hypokalemia presenting with headache, elevated blood pressure, nausea, projectile vomiting, and loss of consciousness.  She did not receive IV t-PA due to acute intraparenchymal hemorrhage.   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  2D Echo - EF 55-60%. No cardiac source of emboli identified.  LDL - 103  HgbA1c 5.1  VTE prophylaxis - subq heparin Diet NPO time specified  No antithrombotic prior to admission, now on No antithrombotic secondary to hemorrhage.  On amantadine for arousal - 200mg  bid  Ongoing aggressive stroke risk factor  management  Therapy recommendations: pending  Disposition: Pending  Respiratory failure  Intubated  CCM on board  Trach today  Hydrocephalus  Repeat CT showed progressive hydrocephalus  NSG on board  S/p Right frontal ventriculostomy drain - Dr Yetta Barre - 10/05/16  Drainage patent  Repeat CT head much improved hydrocephalus  Dysphagia   PEG tomorrow  Hypertension  Home meds - norvasc (missed doses)  Stable  BP goal < 140  Put on lisinopril 20mg  for BP control Long-term BP goal normotensive  Other Stroke Risk Factors  Medical noncompliance  Fever with leukocytosis  101.5 axillary -> 102-> 101.2->102.7  WBCs 18.0 -> 16.7 -> 14.5->12.0-> 10.7  On unasyn started Friday, 10/05/2016  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 This patient's care requires constant monitoring of vital signs, hemodynamics, respiratory and cardiac monitoring, review of multiple databases, neurological assessment, discussion with family, other specialists and medical decision making of high complexity. I spent 35 minutes of neurocritical care time in the care of this patient.  Marvel Plan, MD PhD Stroke Neurology 10/10/2016 3:12 PM    To contact Stroke Continuity provider, please refer to [email or web address]. After hours, contact General Neurology

## 2016-10-11 ENCOUNTER — Encounter (HOSPITAL_COMMUNITY): Payer: Self-pay | Admitting: General Surgery

## 2016-10-11 DIAGNOSIS — J9610 Chronic respiratory failure, unspecified whether with hypoxia or hypercapnia: Secondary | ICD-10-CM

## 2016-10-11 DIAGNOSIS — I610 Nontraumatic intracerebral hemorrhage in hemisphere, subcortical: Secondary | ICD-10-CM

## 2016-10-11 LAB — BASIC METABOLIC PANEL
Anion gap: 9 (ref 5–15)
BUN: 11 mg/dL (ref 6–20)
CALCIUM: 8.4 mg/dL — AB (ref 8.9–10.3)
CO2: 24 mmol/L (ref 22–32)
CREATININE: 0.68 mg/dL (ref 0.44–1.00)
Chloride: 106 mmol/L (ref 101–111)
GFR calc Af Amer: >60 mL/min (ref >60)
Glucose, Bld: 109 mg/dL — ABNORMAL HIGH (ref 65–99)
Potassium: 4.7 mmol/L (ref 3.5–5.1)
SODIUM: 139 mmol/L (ref 135–145)

## 2016-10-11 LAB — URINALYSIS, ROUTINE W REFLEX MICROSCOPIC
BILIRUBIN URINE: NEGATIVE
Glucose, UA: NEGATIVE mg/dL
Hgb urine dipstick: NEGATIVE
KETONES UR: NEGATIVE mg/dL
LEUKOCYTES UA: NEGATIVE
NITRITE: NEGATIVE
Protein, ur: NEGATIVE mg/dL
SPECIFIC GRAVITY, URINE: 1.015 (ref 1.005–1.030)
pH: 7 (ref 5.0–8.0)

## 2016-10-11 LAB — GLUCOSE, CAPILLARY
GLUCOSE-CAPILLARY: 116 mg/dL — AB (ref 65–99)
GLUCOSE-CAPILLARY: 138 mg/dL — AB (ref 65–99)
GLUCOSE-CAPILLARY: 92 mg/dL (ref 65–99)
GLUCOSE-CAPILLARY: 72 mg/dL (ref 65–99)
GLUCOSE-CAPILLARY: 60 mg/dL — AB (ref 65–99)
Glucose-Capillary: 72 mg/dL (ref 65–99)
Glucose-Capillary: 82 mg/dL (ref 65–99)

## 2016-10-11 LAB — CBC
HCT: 40.1 % (ref 36.0–46.0)
Hemoglobin: 12.8 g/dL (ref 12.0–15.0)
MCH: 29 pg (ref 26.0–34.0)
MCHC: 31.9 g/dL (ref 30.0–36.0)
MCV: 90.7 fL (ref 78.0–100.0)
PLATELETS: 209 10*3/uL (ref 150–400)
RBC: 4.42 MIL/uL (ref 3.87–5.11)
RDW: 12.6 % (ref 11.5–15.5)
WBC: 11.5 10*3/uL — ABNORMAL HIGH (ref 4.0–10.5)

## 2016-10-11 NOTE — Progress Notes (Signed)
Pt placed on 35% ATC and tolerating well at this time.  RT will continue to monitor.  

## 2016-10-11 NOTE — Progress Notes (Signed)
8 pm blood sugar 60. Pt on tube feeds. OJ given and CBG up to 72, will notify MD.

## 2016-10-11 NOTE — Progress Notes (Signed)
Patient placed back on full support to rest throughout the night. Patient is tolerating it well and RT will continue to monitor.

## 2016-10-11 NOTE — Op Note (Signed)
[facility] Patient Name: Anne Olsen Procedure Date : 10/10/2016 MRN: [id number] Attending MD: Kathrin Ruddy, MD Date of Birth: 1975/11/27 CSN: [id number] Age: 41 Admit Type: Inpatient Procedure:                Upper GI endoscopy Indications:              Place PEG because patient is unable to eat, Place                            PEG due to neurological disorder causing impaired                            swallowing Providers:                Jimmye Norman, MD, Kandice Robinsons, Technician Referring MD:              Medicines:                Fentanyl 100 micrograms IV, Midazolam 4 mg IV Complications:            No immediate complications. Estimated Blood Loss:      Procedure:                Pre-Anesthesia Assessment:                           - Prior to the procedure, a History and Physical                            was performed, and patient medications and                            allergies were reviewed. The patient is unable to                            give consent secondary to the patient's altered                            mental status. The risks and benefits of the                            procedure and the sedation options and risks were                            discussed with the patient's spouse. All questions                            were answered and informed consent was obtained.                            Patient identification and proposed procedure were                            verified by the physician and the nurse in the  pre-procedure area. Mental Status Examination:                            obtunded. Airway Examination: tracheostomy via                            ventilator. Respiratory Examination: clear to                            auscultation. CV Examination: normal. ASA Grade                            Assessment: III - A patient with severe systemic                            disease. After  reviewing the risks and benefits,                            the patient was deemed in satisfactory condition to                            undergo the procedure. The anesthesia plan was to                            use deep sedation / analgesia. Immediately prior to                            administration of medications, the patient was                            re-assessed for adequacy to receive sedatives. The                            heart rate, respiratory rate, oxygen saturations,                            blood pressure, adequacy of pulmonary ventilation,                            and response to care were monitored throughout the                            procedure. The physical status of the patient was                            re-assessed after the procedure.                           After obtaining informed consent, the endoscope was                            passed under direct vision. Throughout the  procedure, the patient's blood pressure, pulse, and                            oxygen saturations were monitored continuously. The                            EG-[id number] ([id number]) scope was introduced through the                            mouth, and advanced to the second part of duodenum.                            The upper GI endoscopy was accomplished without                            difficulty. The patient tolerated the procedure                            well. The total duration of the procedure was 24                            minutes. Scope In: Scope Out: Findings:      The esophagus was normal.      The stomach was normal. Estimated blood loss was minimal. The patient       was placed in the supine position for PEG placement. The stomach was       insufflated to appose gastric and abdominal walls. A site was located in       the body of the stomach with excellent manual external pressure for       placement. The abdominal wall  was marked and prepped in a sterile       manner. The area was anesthetized with 3 mL of 1% lidocaine. The trocar       needle was introduced through the abdominal wall and into the stomach       under direct endoscopic view. A snare was introduced through the       endoscope and opened in the gastric lumen. The guide wire was passed       through the trocar and into the open snare. The snare was closed around       the guide wire. The endoscope and snare were removed, pulling the wire       out through the mouth. A skin incision was made at the site of needle       insertion. The externally removable 20 Fr Bard gastrostomy tube was       lubricated. The G-tube was tied to the guide wire and pulled through the       mouth and into the stomach. The trocar needle was removed, and the       gastrostomy tube was pulled out from the stomach through the skin. The       external bumper was attached to the gastrostomy tube, and the tube was       cut to remove the guide wire. The final position of the gastrostomy tube       was confirmed by relook endoscopy, and skin marking noted to be [cm] at  the external bumper. The final tension and compression of the abdominal       wall by the PEG tube and external bumper were checked [PEG Abdominal       Wall Tension]. The feeding tube was capped, and the tube site cleaned       and dressed.      The examined duodenum was normal. Impression:               - Normal esophagus.                           - Normal stomach.                           - Normal examined duodenum.                           - No specimens collected.                           - Normal examination.                           - An externally removable PEG placement was                            successfully completed. Recommendation:           - Please follow the post-PEG recommendations                            including: may use PEG today for meds and water,                             use PEG today after checked by physician and start                            using PEG today. Procedure Code(s):        --- Professional ---                           [phone], Esophagogastroduodenoscopy, flexible,                            transoral; with directed placement of percutaneous                            gastrostomy tube Diagnosis Code(s):        --- Professional ---                           R63.3, Feeding difficulties                           Z43.1, Encounter for attention to gastrostomy                           R29.818, Other symptoms and signs involving the  nervous system                           R13.10, Dysphagia, unspecified CPT copyright 2016 [employer]. All rights reserved. The codes documented in this report are preliminary and upon coder review may  be revised to meet current compliance requirements. Kathrin RuddyJames Mykenzie Ebanks III, MD Jimmye NormanJames Beryle Zeitz III, MD 10/11/2016 3:23:36 AM This report has been signed electronically. Number of Addenda: 0

## 2016-10-11 NOTE — Progress Notes (Signed)
STROKE TEAM PROGRESS NOTE   SUBJECTIVE (INTERVAL HISTORY) Husband And mom are at bedside. Pt still intubated not on sedation, moving more on the right arm and foot. Had PEG and trach yesterday, tolerating well. Still has fever, concerning for central fever.   OBJECTIVE Temp:  [98.9 F (37.2 C)-103.3 F (39.6 C)] 99.5 F (37.5 C) (02/01 1600) Pulse Rate:  [69-103] 69 (02/01 1800) Cardiac Rhythm: Normal sinus rhythm (01/31 2000) Resp:  [16-31] 19 (02/01 1800) BP: (120-144)/(84-106) 127/91 (02/01 1800) SpO2:  [100 %] 100 % (02/01 1800) FiO2 (%):  [30 %-40 %] 35 % (02/01 1615) Weight:  [160 lb 7.9 oz (72.8 kg)] 160 lb 7.9 oz (72.8 kg) (02/01 0122)  CBC:   Recent Labs Lab 10/06/16 1123  10/09/16 0305 10/11/16 0409  WBC 16.2*  < > 10.7* 11.5*  NEUTROABS 12.5*  --  6.9  --   HGB 14.3  < > 13.3 12.8  HCT 43.2  < > 40.4 40.1  MCV 89.6  < > 89.0 90.7  PLT 250  < > 234 209  < > = values in this interval not displayed.  Basic Metabolic Panel:   Recent Labs Lab 10/08/16 0406 10/08/16 1658 10/09/16 0305 10/11/16 0409  NA 139  --  138 139  K 4.4  --  4.1 4.7  CL 109  --  108 106  CO2 23  --  24 24  GLUCOSE 111*  --  127* 109*  BUN 12  --  9 11  CREATININE 0.85  --  0.79 0.68  CALCIUM 8.1*  --  8.3* 8.4*  MG 1.9 1.7  --   --   PHOS 2.2* 2.5  --   --     Lipid Panel:     Component Value Date/Time   CHOL 182 10/06/2016 0413   TRIG 82 10/06/2016 0413   HDL 63 10/06/2016 0413   CHOLHDL 2.9 10/06/2016 0413   VLDL 16 10/06/2016 0413   LDLCALC 103 (H) 10/06/2016 0413   HgbA1c:  Lab Results  Component Value Date   HGBA1C 5.1 10/06/2016   Urine Drug Screen:     Component Value Date/Time   LABOPIA NONE DETECTED 10/04/2016 2054   COCAINSCRNUR NONE DETECTED 10/04/2016 2054   LABBENZ NONE DETECTED 10/04/2016 2054   AMPHETMU NONE DETECTED 10/04/2016 2054   THCU NONE DETECTED 10/04/2016 2054   LABBARB NONE DETECTED 10/04/2016 2054      IMAGING I have personally  reviewed the radiological images below and agree with the radiology interpretations.  Ct Angio Head and Neck W Or Wo Contrast 10/04/2016 Acute intraparenchymal hemorrhage in the right thalamus with hematoma measuring 6.9 mL. Intraventricular penetration. Small amount of subarachnoid penetration. CTA of the neck and head do not show any atherosclerotic narrowing. The vessels are somewhat tortuous suggesting hypertension. No sign of aneurysm or vascular malformation to explain the right thalamic hemorrhage.   Dg Chest Portable 1 View 10/04/2016 1. Endotracheal tube seen ending 2 cm above the carina.  2. Enteric tube noted ending overlying the body of the stomach, with the side port about the gastroesophageal junction.  3. Vascular congestion and mild cardiomegaly. Mild patchy left-sided airspace opacity may reflect atelectasis or mild interstitial edema.  Ct Head Code Stroke W/o Cm 10/04/2016 Acute intraparenchymal hemorrhage in the right thalamus with hematoma measuring 6.9 mL. Intraventricular penetration. Small amount of subarachnoid penetration. CTA of the neck and head do not show any atherosclerotic narrowing. The vessels are somewhat tortuous suggesting hypertension. No  sign of aneurysm or vascular malformation to explain the right thalamic hemorrhage.   Ct Head W/o Cm 10/05/2016 Decrease in volume of right thalamic hemorrhage. Intraventricular and subarachnoid hemorrhage again noted. Progressive hydrocephalus.  10/07/2016 Right thalamic hemorrhage with subarachnoid and ventricular hemorrhage unchanged. No new hemorrhage Interval placement of right frontal catheter with decrease inventricle size. Ventricle size now normal.  TTE  10/06/2016 Study Conclusions  Left ventricle: The cavity size was normal. Systolic function was normal.  The estimated ejection fraction was in the range of 55% to 60%.  Wall motion was normal; there were no regional wall motion abnormalities. Impressions: No  cardiac source of emboli was indentified.   PHYSICAL EXAM  Temp:  [98.9 F (37.2 C)-103.3 F (39.6 C)] 99.5 F (37.5 C) (02/01 1600) Pulse Rate:  [69-103] 69 (02/01 1800) Resp:  [16-31] 19 (02/01 1800) BP: (120-144)/(84-106) 127/91 (02/01 1800) SpO2:  [100 %] 100 % (02/01 1800) FiO2 (%):  [30 %-40 %] 35 % (02/01 1615) Weight:  [160 lb 7.9 oz (72.8 kg)] 160 lb 7.9 oz (72.8 kg) (02/01 0122)  General - Well nourished, well developed, intubated, not on sedation, in coma.  Ophthalmologic - Fundi not visualized.  Cardiovascular - Regular rate and rhythm.  Neuro - intubated, not on sedation, not responsive to voice, in coma. Pupils 2.42mm bilaterally, no pupillary reflex, no corneal reflexes bilaterally, no doll's eyes, eyes mid position. Positive gag and cough, breathing over the vent. On pain stimulation, no movement on LUE and LLE, but mild withdraw at RLE and RUE, no babinski, DTR diminished. Sensation, coordination and gait not able to test.    ASSESSMENT/PLAN Ms. Abbigayle Toole is a 41 y.o. female with history of hypertension and hypokalemia presenting with headache, elevated blood pressure, nausea, projectile vomiting, and loss of consciousness.  She did not receive IV t-PA due to acute intraparenchymal hemorrhage.   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  2D Echo - EF 55-60%. No cardiac source of emboli identified.  LDL - 103  HgbA1c 5.1  VTE prophylaxis - subq heparin Diet NPO time specified  No antithrombotic prior to admission, now on No antithrombotic secondary to hemorrhage.  On amantadine for arousal - 200mg  bid  Ongoing aggressive stroke risk factor management  Therapy recommendations:  pending  Disposition: Pending  Respiratory failure  Trach on 10/10/16  CCM on board  Tolerating well  Hydrocephalus  Repeat CT showed progressive hydrocephalus  NSG on board  S/p Right frontal ventriculostomy drain - Dr Yetta Barre - 10/05/16  Drainage patent  Repeat CT head much improved hydrocephalus  Dysphagia   PEG on 10/10/16  Hypertension  Home meds - norvasc (missed doses)  Stable  BP goal < 140  Put on lisinopril 20mg  for BP control Long-term BP goal normotensive  Other Stroke Risk Factors  Medical noncompliance  Fever with leukocytosis  101.5 axillary -> 102-> 101.2->102.7->103.3  WBCs 18.0 -> 16.7 -> 14.5->12.0-> 10.7->11.5  On unasyn started Friday, 10/05/2016  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 This patient's care requires constant monitoring of vital signs, hemodynamics, respiratory and cardiac monitoring, review of multiple databases, neurological assessment, discussion with family, other specialists and medical decision making of high complexity. I spent 40 minutes of neurocritical care time in the care of this patient.  I had a long discussion with husband today regarding likely poor prognosis. I let him understand that currently we are doing all necessary measures to help patient survive the acute phase. Although she is young, however, it is still very likely the patient may not regain meaningful neurological recovery due to the extent and location of the ICH. I also let him understand that there likely to be multiple complications down the road which may make her condition  deteriorated over time. Patient's husband expressed understanding and appreciation.  Neurology will sign off. Please call with questions. [facility]. Thanks for the consult.  Marvel PlanJindong Yesenia Locurto, MD PhD Stroke Neurology 10/11/2016 6:33 PM    To contact Stroke Continuity provider, please refer to [email or web address]. After hours, contact General Neurology

## 2016-10-11 NOTE — Progress Notes (Signed)
PULMONARY / CRITICAL CARE MEDICINE   Name: Anne Olsen MRN: [id number] DOB: Sep 11, 1975     ADMISSION DATE:  10/04/2016  REFERRING MD:  Benjiman Core, M.D. / EDP  CHIEF COMPLAINT:  Acute Intracranial Hemorrhage  BRIEF SUMMARY:  41 y.o. female pmhx of HTN who presented 1/25 to [facility] via EMS for headache, weakness, and AMS. History was provided entirely by the husband [place]. 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 He got home from work around 5 pm when she developed a sudden headache and screamed out loud. Patient told him she had never had a headache like this before. She developed weakness on her left side and became very sleepy. Husband said he tried feeding her and putting her to bed, but she became more and more lethargic and he called 911.   On arrival to the ED, blood pressure was 161/132. Pupils were noted to be unequal. She was very lethargic on non rebreather. She subsequently became unresponsive and was intubated for airway protection and started on propofol for sedation. Repeat blood pressure was 196/95. CT head revealed an acute intraparenchymal hemorrhage in the right thalamus with a hematoma measuring 6.9 mL. Per husband, patient's only other medical problems include issues with high potassium. She started taking birth control pills about 2 months ago. She exercises regularly and is otherwise healthy. Husband is unaware of hypertension or aneurysms that run in her family.    SUBJECTIVE:  S/p trach and PEG yesterday.  Remains febrile 103.3.      VITAL SIGNS: BP 124/84   Pulse 80   Temp (!) 103.3 F (39.6 C) (Rectal) Comment: 650 mg tylenol given for fever  Resp (!) 24   Ht 5\' 1"  (1.549 m)   Wt 72.8 kg (160 lb 7.9 oz)   LMP  (LMP Unknown)   SpO2 100%   BMI 30.33 kg/m   HEMODYNAMICS:    VENTILATOR SETTINGS: Vent Mode: PRVC FiO2 (%):  [30 %-100 %] 30 % Set Rate:  [16 bmp-22 bmp] 16 bmp Vt Set:   [380 mL] 380 mL PEEP:  [5 cmH20] 5 cmH20 Pressure Support:  [5 cmH20] 5 cmH20 Plateau Pressure:  [12 cmH20-14 cmH20] 14 cmH20  INTAKE / OUTPUT: I/O last 3 completed shifts: In: 2800 [I.V.:360; Other:120; NG/GT:1570; IV Piggyback:750] Out: 3317 [Urine:2915; Drains:402]  PHYSICAL EXAMINATION: General:  Young female, NAD HEENT: MM pink/moist, trach c/d  Neuro:  Minimal response, per RN has intermittently followed a few commands in spanish but no response to me, flaccid L, moves R spontaneously  CV: s1s2 rrr, no m/r/g PULM: even/non-labored on PS 8/5, diminished  WU:JWJX, non-tender, bsx4 active  Extremities: warm/dry, scant BLE edema  Skin: no rashes or lesions   LABS: CBC Latest Ref Rng & Units 10/11/2016 10/09/2016 10/08/2016  WBC 4.0 - 10.5 K/uL 11.5(H) 10.7(H) 12.0(H)  Hemoglobin 12.0 - 15.0 g/dL 91.4 78.2 95.6  Hematocrit 36.0 - 46.0 % 40.1 40.4 40.2  Platelets 150 - 400 K/uL 209 234 237   BMP Latest Ref Rng & Units 10/11/2016 10/09/2016 10/08/2016  Glucose 65 - 99 mg/dL 213(Y) 865(H) 846(N)  BUN 6 - 20 mg/dL 11 9 12   Creatinine 0.44 - 1.00 mg/dL 6.29 5.28 4.13  Sodium 135 - 145 mmol/L 139 138 139  Potassium 3.5 - 5.1 mmol/L 4.7 4.1 4.4  Chloride 101 - 111 mmol/L 106 108 109  CO2 22 - 32 mmol/L 24 24 23   Calcium 8.9 - 10.3  mg/dL 7.8(G) 8.3(L) 8.1(L)     STUDIES:  CT HEAD CODE STROKE 1/25 >  Acute intraparenchymal hemorrhage in the right thalamus with hematoma measuring 6.9 mL. Intraventricular penetration. Small amount of subarachnoid penetration. CTA of the neck and head 1/25 > do not show any atherosclerotic narrowing. The vessels are somewhat tortuous suggesting hypertension. No sign of aneurysm or vascular malformation to explain the right thalamic hemorrhage. PORT CXR 1/26 > Personally reviewed by me.Minimal patchy right lower lobe opacity. No other parenchymal infiltrate appreciated. No pleural effusion. Endotracheal tube in good position. Enteric feeding tube coursing  below diaphragm. CT HEAD 1/26 > decreased volume of right thalamic hemorrhage, intraventricular and subarachnoid hemorrhage, progressive hydrocephalus EEG 1/31>>> This EEG is consistent with a focal area of cerebral dysfunction in the right hemisphere superimposed on a generalized nonspecific cerebral dysfunction as can be seen with medication effect among other causes.  MICROBIOLOGY: MRSA PCR 1/25:  Negative  ANTIBIOTICS: Unasyn 1/26 >>   SIGNIFICANT EVENTS: 1/25 - Admit with AMS, intubated  1/26 - IVC drain placed   LINES/TUBES: OETT 7.5 1/24 >>1/31 Perc trach (DF) 1/31>>> FOLEY 1/24 >> PIV  ASSESSMENT / PLAN:  41 y.o. female status post acute intraparenchymal hemorrhage in the right thalamus with intraventricular penetration as well as a small amount of penetration into the subarachnoid space.  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 No obvious infiltrate  Plan -  Daily SBT - if tol PS wean can attempt ATC trials F/u CXR  Trach care    Fever - suspect neuro related. No sig leukocytosis, no obvious source, pct neg x 2.  Plan -  Will d/c unasyn today - total 7 days  Continue cooling blanket   Hyperkalemia - resolved  Plan -  F/u chem in am   At risk protein calorie malnutrition  Plan -  TF via PEG  pepcid    Prophylaxis - SCD's only, no heparin with ICH.  Pepcid Q12h  No family at bedside 2/1.    Cc time 30 mins    Dirk Dress, NP 10/11/2016  8:38 AM Pager: [phone] or [phone]    STAFF NOTE: I, Rory Percy, MD FACP have personally reviewed patient's available data, including medical history, events of note, physical examination and test results as part of my evaluation. I have discussed with resident/NP and other care providers such as pharmacist, RN and RRT. In addition, I personally evaluated patient  and elicited key findings of: not awake, not fc, trach site clean, peg wnl, abdo soft, fever noted, pcxr no PNA/infiltrate, PCT neg, fevers likely neuro, get UA , BC, hold off abx, received course, wean ps now then to goal Trach collar if no apnea or TV alarms on PS prior, start TF when okay by trauma, much appreciated rapid peg placement, prn labetolol, low threshold to dc acie, follow crft, treat fever aggressive, if with 103, may need arctic sun, I updated husband The patient is critically ill with multiple organ systems failure and requires high complexity decision making for assessment and support, frequent evaluation and titration of therapies, application of advanced monitoring technologies and extensive interpretation of multiple databases.   Critical Care Time devoted to patient care services described in this note is 30 Minutes. This time reflects time of care of this signee: Rory Percy, MD FACP. This  critical care time does not reflect procedure time, or teaching time or supervisory time of PA/NP/Med student/Med Resident etc but could involve care discussion time. Rest per NP/medical resident whose note is outlined above and that I agree with   Mcarthur Rossettianiel J. Tyson AliasFeinstein, MD, FACP Pgr: [phone] [place] [facility] 10/11/2016 9:35 AM

## 2016-10-11 NOTE — Progress Notes (Signed)
Patient ID: Anne Olsen, female   DOB: May 05, 1976, 41 y.o.   MRN: [id number] Raised ventric to 15 cm today.

## 2016-10-11 NOTE — Progress Notes (Signed)
Nutrition Follow-up  INTERVENTION:   Continue:  Vital AF 1.2 @ 60 ml/hr (1440 ml/day) Provides: 1728 kcal, 108 grams protein, and 1167 ml H2O.   NUTRITION DIAGNOSIS:   Inadequate oral intake related to inability to eat as evidenced by NPO status. Ongoing.   GOAL:   Patient will meet greater than or equal to 90% of their needs Met.   MONITOR:   TF tolerance, Vent status, I & O's  REASON FOR ASSESSMENT:   Consult Enteral/tube feeding initiation and management  ASSESSMENT:   Pt with PMH of HTN admitted with R intraparenchymal brain hemorrhage.   EVD in placed 1/31 trach/PEG +fevers, per MD likely neuro  Constipation on senokot BID, discuss with RN  Patient is currently intubated on ventilator support MV: 7.6 L/min Temp (24hrs), Avg:101.9 F (38.8 C), Min:98.7 F (37.1 C), Max:103.3 F (39.6 C)   Diet Order:  Diet NPO time specified  Skin:  Reviewed, no issues  Last BM:  1/24   Height:   Ht Readings from Last 1 Encounters:  10/04/16 5' 1" (1.549 m)    Weight:   Wt Readings from Last 1 Encounters:  10/11/16 160 lb 7.9 oz (72.8 kg)    Ideal Body Weight:  47.7 kg  BMI:  Body mass index is 30.33 kg/m.  Estimated Nutritional Needs:   Kcal:  4709  Protein:  90-100 grams  Fluid:  > 1.7 L/day  EDUCATION NEEDS:   No education needs identified at this time  [place], [place], [place] Pager [phone] After Hours Pager

## 2016-10-11 NOTE — Procedures (Signed)
Bronchoscopy Procedure Note Ricardo JerichoViviana Paschal [id number] [birth year]  Procedure: Bronchoscopy Indications: Diagnostic evaluation of the airways  Procedure Details Consent: Risks of procedure as well as the alternatives and risks of each were explained to the (patient/caregiver).  Consent for procedure obtained. Time Out: Verified patient identification, verified procedure, site/side was marked, verified correct patient position, special equipment/implants available, medications/allergies/relevent history reviewed, required imaging and test results available.  Performed  In preparation for procedure, patient was given 100% FiO2 and bronchoscope lubricated. Sedation: Benzodiazepines, Muscle relaxants and Etomidate  Airway entered and the following bronchi were examined: RUL, RML, RLL, LUL, LLL and Bronchi.   Procedures performed: Brushings performed Bronchoscope removed.    Evaluation Hemodynamic Status: BP stable throughout; O2 sats: stable throughout Patient's Current Condition: stable Specimens:  None Complications: No apparent complications Patient did tolerate procedure well.   Koren BoundYACOUB,Eivan Gallina 10/11/2016

## 2016-10-11 NOTE — Progress Notes (Signed)
Spoke with Dr Alva([facility]) about pt CBG being low. Will start D10 if CBG continues to be low as discussed with MD  .

## 2016-10-11 NOTE — Progress Notes (Signed)
Patient ID: Ricardo JerichoViviana Eichler, female   DOB: 31-Mar-1976, 41 y.o.   MRN: [id number] ABD benign PEG site OK. Trauma will sign off. I spoke with her mother and husband.  Violeta GelinasBurke Naithan Delage, MD, MPH, FACS Trauma: [phone] General Surgery: [phone]

## 2016-10-12 DIAGNOSIS — I6300 Cerebral infarction due to thrombosis of unspecified precerebral artery: Secondary | ICD-10-CM

## 2016-10-12 LAB — CBC
HEMATOCRIT: 35.6 % — AB (ref 36.0–46.0)
HEMOGLOBIN: 11.5 g/dL — AB (ref 12.0–15.0)
MCH: 28.8 pg (ref 26.0–34.0)
MCHC: 32.3 g/dL (ref 30.0–36.0)
MCV: 89.2 fL (ref 78.0–100.0)
Platelets: 289 10*3/uL (ref 150–400)
RBC: 3.99 MIL/uL (ref 3.87–5.11)
RDW: 12.7 % (ref 11.5–15.5)
WBC: 14.4 10*3/uL — ABNORMAL HIGH (ref 4.0–10.5)

## 2016-10-12 LAB — GLUCOSE, CAPILLARY
GLUCOSE-CAPILLARY: 113 mg/dL — AB (ref 65–99)
Glucose-Capillary: 89 mg/dL (ref 65–99)
Glucose-Capillary: 102 mg/dL — ABNORMAL HIGH (ref 65–99)
Glucose-Capillary: 108 mg/dL — ABNORMAL HIGH (ref 65–99)

## 2016-10-12 MED ORDER — POLYETHYLENE GLYCOL 3350 17 G PO PACK
17.0000 g | PACK | Freq: Every day | ORAL | Status: DC
  Administered 2016-10-12 – 2016-11-15 (×29): 17 g via ORAL
  Filled 2016-10-12 (×33): qty 1

## 2016-10-12 NOTE — Progress Notes (Signed)
Patient ID: Anne JerichoViviana Mounts, female   DOB: [birth year], 41 y.o.   MRN: [id number] No change in neuro exam, deeply comatose, ventric patent at 15 cm H2o, ICP 18. Continue ventric, but the real question will be whether she will really be a good candidate for shunting if we cannot wean the ventric over time. Prognosis for functional recovery remains grim.

## 2016-10-12 NOTE — Progress Notes (Signed)
PULMONARY / CRITICAL CARE MEDICINE   Name: Anne JerichoViviana Olsen MRN: [id number] DOB: Nov 22, 1975     ADMISSION DATE:  10/04/2016  REFERRING MD:  Benjiman CoreNathan Pickering, M.D. / EDP  CHIEF COMPLAINT:  Acute Intracranial Hemorrhage  BRIEF SUMMARY:  41 y.o. female pmhx of HTN who [facility] for headache, weakness, and AMS. CT head revealed an acute intraparenchymal hemorrhage in the right thalamus with a hematoma measuring 6.9 mL.   SUBJECTIVE:   Trach collar for 9 hours yesterday. Had an episode of hypoglycemia overnight requiring d5.   VITAL SIGNS: BP 116/71   Pulse 97   Temp (!) 102 F (38.9 C) (Rectal) Comment: 650 mg tylenol given for fever  Resp (!) 25   Ht 5\' 1"  (1.549 m)   Wt 73.2 kg (161 lb 6 oz)   LMP  (LMP Unknown)   SpO2 97%   BMI 30.49 kg/m   HEMODYNAMICS:    VENTILATOR SETTINGS: Vent Mode: PRVC FiO2 (%):  [30 %-40 %] 30 % Set Rate:  [16 bmp] 16 bmp Vt Set:  [380 mL] 380 mL PEEP:  [5 cmH20] 5 cmH20 Pressure Support:  [8 cmH20] 8 cmH20 Plateau Pressure:  [13 cmH20-15 cmH20] 14 cmH20  INTAKE / OUTPUT: I/O last 3 completed shifts: In: 3200 [I.V.:360; Other:180; NG/GT:2160; IV Piggyback:500] Out: 2887 [Urine:2530; Drains:357]  PHYSICAL EXAMINATION: General:  Adult female, no distress, lying in bed  HEENT: Trach clean, dry, intact  Neuro:  Withdrawals RUE/RLE, flickers noted on LLE, no movement to LUE, pupils 2mm brisk intact CV: RRR, no MRG, NI S1/S2 PULM: On vent, clear, diminished breath sounds  GI: non-distended, active bowel sounds  Extremities: warm/dry/intact, BLE edema  Skin: no rashes or lesions   LABS: CBC Latest Ref Rng & Units 10/12/2016 10/11/2016 10/09/2016  WBC 4.0 - 10.5 K/uL 14.4(H) 11.5(H) 10.7(H)  Hemoglobin 12.0 - 15.0 g/dL 11.5(L) 12.8 13.3  Hematocrit 36.0 - 46.0 % 35.6(L) 40.1 40.4  Platelets 150 - 400 K/uL 289 209 234   BMP Latest Ref Rng & Units 10/11/2016 10/09/2016 10/08/2016  Glucose 65 - 99 mg/dL 829(F109(H) 621(H127(H) 086(V111(H)   BUN 6 - 20 mg/dL 11 9 12   Creatinine 0.44 - 1.00 mg/dL 7.840.68 6.960.79 2.950.85  Sodium 135 - 145 mmol/L 139 138 139  Potassium 3.5 - 5.1 mmol/L 4.7 4.1 4.4  Chloride 101 - 111 mmol/L 106 108 109  CO2 22 - 32 mmol/L 24 24 23   Calcium 8.9 - 10.3 mg/dL 2.8(U8.4(L) 8.3(L) 8.1(L)     STUDIES:  CT HEAD CODE STROKE 1/25 >  Acute intraparenchymal hemorrhage in the right thalamus with hematoma measuring 6.9 mL. Intraventricular penetration. Small amount of subarachnoid penetration. CTA of the neck and head 1/25 > do not show any atherosclerotic narrowing. The vessels are somewhat tortuous suggesting hypertension. No sign of aneurysm or vascular malformation to explain the right thalamic hemorrhage. PORT CXR 1/26 > Personally reviewed by me.Minimal patchy right lower lobe opacity. No other parenchymal infiltrate appreciated. No pleural effusion. Endotracheal tube in good position. Enteric feeding tube coursing below diaphragm. CT HEAD 1/26 > decreased volume of right thalamic hemorrhage, intraventricular and subarachnoid hemorrhage, progressive hydrocephalus EEG 1/31>This EEG is consistent with a focal area of cerebral dysfunction in the right hemisphere superimposed on a generalized nonspecific cerebral dysfunction as can be seen with medication effect among other causes. ECHO > 55-60%  MICROBIOLOGY: MRSA PCR 1/25:  Negative Blood 2/1 >>   ANTIBIOTICS: Unasyn 1/26 > 2/1  SIGNIFICANT EVENTS: 1/25 -  Admit with AMS, intubated  1/26 - IVC drain placed  1/31 - PEG/Trach  LINES/TUBES: OETT 7.5 1/24 >1/31 Perc trach (DF) 1/31> FOLEY 1/24 > PIV  ASSESSMENT / PLAN:  41 y.o. female status post acute intraparenchymal hemorrhage in the right thalamus with intraventricular penetration as well as a small amount of penetration into the subarachnoid space.  PULMONARY A: Acute Respiratory Failure secondary to inability to protect airway in setting of ICH  -S/P Trach 1/31 per DF  P:   Vent Support Wean as  tolerated - ATC if able to wean  Routine Trach Care   CARDIOVASCULAR A:  HTN P:  BP goal <140  Continue PRN Labetalol  Lisinopril 20 mg Daily   RENAL A:   Hyperkalemia - resolved  P:   Trend BMP Replace Electrolytes as needed   GASTROINTESTINAL A:   At risk protein calorie malnutrition  Dysphagia  -s/p PEG 1/31 P:   TF via PEG  PPI   HEMATOLOGIC A:   No issues  P:  Trend CBC SCD's only, no heparin with ICH  INFECTIOUS A:   Febrile - suspect neuro related. No sig leukocytosis, no obvious source, pct neg x 2.  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   - Inter-disciplinary family meet or Palliative Care meeting due by:  Ongoing     Jovita Kussmaul, AG-ACNP [place] [facility]  Pgr: [phone]  PCCM Pgr: [phone]  STAFF NOTE: Cindi Carbon, MD FACP have personally reviewed patient's available data, including medical history, events of note, physical examination and test results as part of my evaluation. I have discussed with resident/NP and other care providers such as pharmacist, RN and RRT. In addition, I personally evaluated patient and elicited key findings of: not following commands, lungs clear, int spike fever but lower max, edema mild, even balance goals, if pos would ise lasix to even balnce, appears euvolemic, did trach collar 9 hours , has good volumes  And NON apnea on cpap ps prior, will move to trach collar goal 24 hours, abg in am off vent, cooling blanket needs to be continued, feeds to peg successful, Bp controlled, glu low at times, may need low dose d5  ns, with low glu, assess lft in am, update husband daily basis, drain per NS The patient is critically ill with multiple organ systems failure and requires high complexity decision making for assessment and support, frequent evaluation and titration of therapies, application of advanced monitoring technologies and extensive interpretation of multiple databases.   Critical Care Time devoted to patient care services described in this note is 30 Minutes. This time reflects time of care of this signee: Rory Percy, MD FACP. This critical care time does not reflect procedure time, or teaching time or supervisory time of PA/NP/Med student/Med Resident etc but could involve care discussion time. Rest per NP/medical resident whose note is outlined above and that I agree with   Mcarthur Rossetti. Tyson Alias, MD, FACP Pgr: [phone] [place] [facility] 10/12/2016 9:09 AM

## 2016-10-12 NOTE — Progress Notes (Signed)
eLink Physician-Brief Progress Note Patient Name: Ricardo JerichoViviana Corkins DOB: 12-25-1975 MRN: [id number]   Date of Service  10/12/2016  HPI/Events of Note  Request to renew R soft wrist restraint.   eICU Interventions  Will renew order for R soft wrist restraint.      Intervention Category Minor Interventions: Agitation / anxiety - evaluation and management  Sommer,Steven Dennard Nipugene 10/12/2016, 8:48 PM

## 2016-10-12 NOTE — Progress Notes (Signed)
Patient remains stable and without distress on aerosol trach collar. Patient not placed on ventilator at this time. RT will continue to monitor.

## 2016-10-13 LAB — GLUCOSE, CAPILLARY
GLUCOSE-CAPILLARY: 91 mg/dL (ref 65–99)
GLUCOSE-CAPILLARY: 89 mg/dL (ref 65–99)
GLUCOSE-CAPILLARY: 74 mg/dL (ref 65–99)
Glucose-Capillary: 114 mg/dL — ABNORMAL HIGH (ref 65–99)
Glucose-Capillary: 116 mg/dL — ABNORMAL HIGH (ref 65–99)
Glucose-Capillary: 116 mg/dL — ABNORMAL HIGH (ref 65–99)
Glucose-Capillary: 69 mg/dL (ref 65–99)
Glucose-Capillary: 96 mg/dL (ref 65–99)

## 2016-10-13 LAB — COMPREHENSIVE METABOLIC PANEL
ALT: 18 U/L (ref 14–54)
AST: 36 U/L (ref 15–41)
Albumin: 2.4 g/dL — ABNORMAL LOW (ref 3.5–5.0)
Alkaline Phosphatase: 35 U/L — ABNORMAL LOW (ref 38–126)
Anion gap: 11 (ref 5–15)
BILIRUBIN TOTAL: 0.8 mg/dL (ref 0.3–1.2)
BUN: 14 mg/dL (ref 6–20)
CHLORIDE: 102 mmol/L (ref 101–111)
CO2: 25 mmol/L (ref 22–32)
CREATININE: 0.66 mg/dL (ref 0.44–1.00)
Calcium: 8.7 mg/dL — ABNORMAL LOW (ref 8.9–10.3)
GFR calc Af Amer: >60 mL/min (ref >60)
GLUCOSE: 109 mg/dL — AB (ref 65–99)
Potassium: 4.4 mmol/L (ref 3.5–5.1)
Sodium: 138 mmol/L (ref 135–145)
TOTAL PROTEIN: 5.7 g/dL — AB (ref 6.5–8.1)

## 2016-10-13 LAB — BLOOD GAS, ARTERIAL
Acid-Base Excess: 3.6 mmol/L — ABNORMAL HIGH (ref 0.0–2.0)
Bicarbonate: 27.7 mmol/L (ref 20.0–28.0)
Drawn by: 437071
FIO2: 30
O2 SAT: 97 %
PCO2 ART: 42.5 mmHg (ref 32.0–48.0)
PH ART: 7.429 (ref 7.350–7.450)
PO2 ART: 98.4 mmHg (ref 83.0–108.0)
Patient temperature: 98.6

## 2016-10-13 LAB — CBC
HEMATOCRIT: 39.1 % (ref 36.0–46.0)
HEMOGLOBIN: 13 g/dL (ref 12.0–15.0)
MCH: 29.9 pg (ref 26.0–34.0)
MCHC: 33.2 g/dL (ref 30.0–36.0)
MCV: 89.9 fL (ref 78.0–100.0)
Platelets: 299 10*3/uL (ref 150–400)
RBC: 4.35 MIL/uL (ref 3.87–5.11)
RDW: 12.6 % (ref 11.5–15.5)
WBC: 17.3 10*3/uL — ABNORMAL HIGH (ref 4.0–10.5)

## 2016-10-13 LAB — PHOSPHORUS: Phosphorus: 3.2 mg/dL (ref 2.5–4.6)

## 2016-10-13 LAB — MAGNESIUM: Magnesium: 1.9 mg/dL (ref 1.7–2.4)

## 2016-10-13 MED ORDER — DEXTROSE 50 % IV SOLN
INTRAVENOUS | Status: AC
  Administered 2016-10-13: 25 mL
  Filled 2016-10-13: qty 50

## 2016-10-13 NOTE — Progress Notes (Signed)
PULMONARY / CRITICAL CARE MEDICINE   Name: Anne Olsen MRN: [id number] DOB: 1975/12/01     ADMISSION DATE:  10/04/2016  REFERRING MD:  Benjiman Core, M.D. / EDP  CHIEF COMPLAINT:  Acute Intracranial Hemorrhage  BRIEF SUMMARY:  41 y.o. female pmhx of HTN who presented 1/25 to [facility] via EMS for headache, weakness, and AMS. CT head revealed an acute intraparenchymal hemorrhage in the right thalamus with a hematoma measuring 6.9 mL.   SUBJECTIVE:   Did ATC all night last night Able to clear her secretions.   VITAL SIGNS: BP 136/87   Pulse 85   Temp (!) 100.9 F (38.3 C) (Axillary)   Resp (!) 28   Ht 5\' 1"  (1.549 m)   Wt 71.2 kg (156 lb 15.5 oz)   LMP  (LMP Unknown)   SpO2 100%   BMI 29.66 kg/m   HEMODYNAMICS:    VENTILATOR SETTINGS: FiO2 (%):  [30 %-40 %] 30 %  INTAKE / OUTPUT: I/O last 3 completed shifts: In: 2726 [I.V.:356; Other:60; NG/GT:2160; IV Piggyback:150] Out: 2732 [Urine:2370; Drains:362]  PHYSICAL EXAMINATION: General:  apperas comfortable, no distress HEENT: trach in place, pupils 1mm Neuro:  Moves RUE anad RLE purposefully, will not follow commands, may barely move L to pain CV: regular, no M PULM: clear bilaterally on ATC GI: soft, benign, + BS Extremities: no edema Skin: no rash   LABS: CBC Latest Ref Rng & Units 10/13/2016 10/12/2016 10/11/2016  WBC 4.0 - 10.5 K/uL 17.3(H) 14.4(H) 11.5(H)  Hemoglobin 12.0 - 15.0 g/dL 40.9 11.5(L) 12.8  Hematocrit 36.0 - 46.0 % 39.1 35.6(L) 40.1  Platelets 150 - 400 K/uL 299 289 209   BMP Latest Ref Rng & Units 10/13/2016 10/11/2016 10/09/2016  Glucose 65 - 99 mg/dL 811(B) 147(W) 295(A)  BUN 6 - 20 mg/dL 14 11 9   Creatinine 0.44 - 1.00 mg/dL 2.13 0.86 5.78  Sodium 135 - 145 mmol/L 138 139 138  Potassium 3.5 - 5.1 mmol/L 4.4 4.7 4.1  Chloride 101 - 111 mmol/L 102 106 108  CO2 22 - 32 mmol/L 25 24 24   Calcium 8.9 - 10.3 mg/dL 4.6(N) 6.2(X) 8.3(L)     STUDIES:  CT HEAD CODE STROKE 1/25 >  Acute  intraparenchymal hemorrhage in the right thalamus with hematoma measuring 6.9 mL. Intraventricular penetration. Small amount of subarachnoid penetration. CTA of the neck and head 1/25 > do not show any atherosclerotic narrowing. The vessels are somewhat tortuous suggesting hypertension. No sign of aneurysm or vascular malformation to explain the right thalamic hemorrhage. PORT CXR 1/26 > Personally reviewed by me.Minimal patchy right lower lobe opacity. No other parenchymal infiltrate appreciated. No pleural effusion. Endotracheal tube in good position. Enteric feeding tube coursing below diaphragm. CT HEAD 1/26 > decreased volume of right thalamic hemorrhage, intraventricular and subarachnoid hemorrhage, progressive hydrocephalus EEG 1/31>This EEG is consistent with a focal area of cerebral dysfunction in the right hemisphere superimposed on a generalized nonspecific cerebral dysfunction as can be seen with medication effect among other causes. ECHO > 55-60%  MICROBIOLOGY: MRSA PCR 1/25:  Negative Blood 2/1 >>   ANTIBIOTICS: Unasyn 1/26 > 2/1  SIGNIFICANT EVENTS: 1/25 - Admit with AMS, intubated  1/26 - IVC drain placed  1/31 - PEG/Trach  LINES/TUBES: OETT 7.5 1/24 >1/31 Perc trach (DF) 1/31> FOLEY 1/24 > PIV  ASSESSMENT / PLAN:  41 y.o. female status post acute intraparenchymal hemorrhage in the right thalamus with intraventricular penetration as well as a small amount of penetration into the subarachnoid space.  PULMONARY A: Acute Respiratory Failure secondary to inability to protect airway in setting of ICH  -S/P Trach 1/31 per DF  P:   Continue ATC ad lib, goal continue 24x7 Suction as needed  CARDIOVASCULAR A:  HTN P:  Lisinopril  Labetalol prn Goal SBP < 140  RENAL A:   Hyperkalemia - resolved  P:   Follow BMP  GASTROINTESTINAL A:   At risk protein calorie malnutrition  Dysphagia  -s/p PEG 1/31 P:   PPI, TF via PEG  HEMATOLOGIC A:   No issues  P:   Follow CBC  INFECTIOUS A:   Febrile - suspect neuro related. No sig leukocytosis, no obvious source, pct neg x 2.  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   - Inter-disciplinary family meet or Palliative Care meeting due by:  Ongoing   Independent CC time 34 minutes  Levy Pupaobert Lorne Winkels, MD, PhD 10/13/2016, 2:42 PM [place] [facility] [phone] or if no answer [phone]

## 2016-10-13 NOTE — Progress Notes (Signed)
No issues overnight.   EXAM: BP 113/78   Pulse 75   Temp 99.6 F (37.6 C) (Rectal)   Resp (!) 21   Ht 5\' 1"  (1.549 m)   Wt 71.2 kg (156 lb 15.5 oz)   LMP  (LMP Unknown)   SpO2 96%   BMI 29.66 kg/m   No eye opening to pain On trach collar Minimal purposeful movements RUE No significant movement LUE EVD in place, draining well  IMPRESSION:  41 y.o. Woman with right thalamic hemorrhage, neurologically stable. Per RN, occasionally FC. EVD functional.  PLAN: - Cont EVD drainage. If she does show clear signs of neurologic improvement, may consider VPS.

## 2016-10-14 ENCOUNTER — Inpatient Hospital Stay (HOSPITAL_COMMUNITY): Payer: 59

## 2016-10-14 LAB — GLUCOSE, CAPILLARY
GLUCOSE-CAPILLARY: 109 mg/dL — AB (ref 65–99)
Glucose-Capillary: 104 mg/dL — ABNORMAL HIGH (ref 65–99)
Glucose-Capillary: 111 mg/dL — ABNORMAL HIGH (ref 65–99)
Glucose-Capillary: 121 mg/dL — ABNORMAL HIGH (ref 65–99)
Glucose-Capillary: 154 mg/dL — ABNORMAL HIGH (ref 65–99)

## 2016-10-14 NOTE — Progress Notes (Signed)
PULMONARY / CRITICAL CARE MEDICINE   Name: Ricardo JerichoViviana Gow MRN: [id number] DOB: 1976-03-16     ADMISSION DATE:  10/04/2016  REFERRING MD:  Benjiman CoreNathan Pickering, M.D. / EDP  CHIEF COMPLAINT:  Acute Intracranial Hemorrhage  BRIEF SUMMARY:  41 y.o. female pmhx of HTN who [facility] 1/[facility] for headache, weakness, and AMS. CT head revealed an acute intraparenchymal hemorrhage in the right thalamus with a hematoma measuring 6.9 mL.   SUBJECTIVE:   Tolerating ATC 24x7 RN supports that she followed commands last night, a significant change  VITAL SIGNS: BP 115/73   Pulse 93   Temp (!) 100.6 F (38.1 C) (Rectal)   Resp (!) 33   Ht 5\' 1"  (1.549 m)   Wt 73.1 kg (161 lb 2.5 oz)   LMP  (LMP Unknown)   SpO2 97%   BMI 30.45 kg/m   HEMODYNAMICS:    VENTILATOR SETTINGS: FiO2 (%):  [30 %-35 %] 30 %  INTAKE / OUTPUT: I/O last 3 completed shifts: In: 2918.2 [I.V.:608.2; NG/GT:2160; IV Piggyback:150] Out: 2102 [Urine:1755; Drains:347]  PHYSICAL EXAMINATION: General:  No distress on ATC HEENT: trach CDI, OP clear, IVC in place Neuro:  Moves RUE anad RLE purposefully, some LLE movement today, will not follow commands for me CV: RRR, no M PULM: no wheeze or crackles GI: soft, NT, + BS, TF running Extremities: no deformities Skin: no rash   LABS: CBC Latest Ref Rng & Units 10/13/2016 10/12/2016 10/11/2016  WBC 4.0 - 10.5 K/uL 17.3(H) 14.4(H) 11.5(H)  Hemoglobin 12.0 - 15.0 g/dL 40.913.0 11.5(L) 12.8  Hematocrit 36.0 - 46.0 % 39.1 35.6(L) 40.1  Platelets 150 - 400 K/uL 299 289 209   BMP Latest Ref Rng & Units 10/13/2016 10/11/2016 10/09/2016  Glucose 65 - 99 mg/dL 811(B109(H) 147(W109(H) 295(A127(H)  BUN 6 - 20 mg/dL 14 11 9   Creatinine 0.44 - 1.00 mg/dL 2.130.66 0.860.68 5.780.79  Sodium 135 - 145 mmol/L 138 139 138  Potassium 3.5 - 5.1 mmol/L 4.4 4.7 4.1  Chloride 101 - 111 mmol/L 102 106 108  CO2 22 - 32 mmol/L 25 24 24   Calcium 8.9 - 10.3 mg/dL 4.6(N8.7(L) 6.2(X8.4(L) 8.3(L)     STUDIES:  CT HEAD CODE STROKE  1/25 >  Acute intraparenchymal hemorrhage in the right thalamus with hematoma measuring 6.9 mL. Intraventricular penetration. Small amount of subarachnoid penetration. CTA of the neck and head 1/25 > do not show any atherosclerotic narrowing. The vessels are somewhat tortuous suggesting hypertension. No sign of aneurysm or vascular malformation to explain the right thalamic hemorrhage. PORT CXR 1/26 > Personally reviewed by me.Minimal patchy right lower lobe opacity. No other parenchymal infiltrate appreciated. No pleural effusion. Endotracheal tube in good position. Enteric feeding tube coursing below diaphragm. CT HEAD 1/26 > decreased volume of right thalamic hemorrhage, intraventricular and subarachnoid hemorrhage, progressive hydrocephalus EEG 1/31>This EEG is consistent with a focal area of cerebral dysfunction in the right hemisphere superimposed on a generalized nonspecific cerebral dysfunction as can be seen with medication effect among other causes. ECHO > 55-60%  MICROBIOLOGY: MRSA PCR 1/25:  Negative Blood 2/1 >>   ANTIBIOTICS: Unasyn 1/26 > 2/1  SIGNIFICANT EVENTS: 1/25 - Admit with AMS, intubated  1/26 - IVC drain placed  1/31 - PEG/Trach  LINES/TUBES: OETT 7.5 1/24 >1/31 Perc trach (DF) 1/31> FOLEY 1/24 > PIV  ASSESSMENT / PLAN:  41 y.o. female status post acute intraparenchymal hemorrhage in the right thalamus with intraventricular penetration as well as a small amount of  penetration into the subarachnoid space.  PULMONARY A: Acute Respiratory Failure secondary to inability to protect airway in setting of ICH  -S/P Trach 1/31 per DF  P:   ATC 24x7 Suctioning as needed > needs rarely  CARDIOVASCULAR A:  HTN P:  Continue Lisinopril  Labetalol prn Goal SBP < 140  RENAL A:   Hyperkalemia - resolved  P:   Follow BMP  GASTROINTESTINAL A:   At risk protein calorie malnutrition  Dysphagia  -s/p PEG 1/31 P:   TF's via PEG pepcid   HEMATOLOGIC A:    Leukocytosis P:  Follow CBC intermittently  INFECTIOUS A:   Febrile - suspect neuro related. No sig leukocytosis, no obvious source, pct neg x 2.  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   - Inter-disciplinary family meet or Palliative Care meeting due by:  Ongoing   Independent CC time 33 minutes  Levy Pupa, MD, PhD 10/14/2016, 8:55 AM [facility] [phone] or if no answer [phone]

## 2016-10-14 NOTE — Progress Notes (Signed)
Patient continuing to tolerate trach collar well at this time. No distress noted, patient is able to cough up some secretions on her own. Ventilator on stand-by at bedside if needed. RT will continue to monitor.

## 2016-10-14 NOTE — Progress Notes (Signed)
No issues overnight. Pt noted by RN last night to be consistently following commands on right.  EXAM:  BP 115/73   Pulse 93   Temp (!) 100.6 F (38.1 C) (Rectal)   Resp (!) 33   Ht 5\' 1"  (1.549 m)   Wt 73.1 kg (161 lb 2.5 oz)   LMP  (LMP Unknown)   SpO2 97%   BMI 30.45 kg/m   No eye opening Breathing spontaneously on trach collar Occassional spontaneous movements primarily on right EVD in place, draining well @ 15  IMPRESSION:  41 y.o. female with the thalamic ICH/IVH.   PLAN: - Will repeat CTH today. If blood in the aqueduct and 4th ventricle has cleared, may raise or clamp EVD and monitor exam.

## 2016-10-15 ENCOUNTER — Inpatient Hospital Stay (HOSPITAL_COMMUNITY): Payer: 59

## 2016-10-15 LAB — GLUCOSE, CAPILLARY
GLUCOSE-CAPILLARY: 115 mg/dL — AB (ref 65–99)
GLUCOSE-CAPILLARY: 99 mg/dL (ref 65–99)
Glucose-Capillary: 113 mg/dL — ABNORMAL HIGH (ref 65–99)
Glucose-Capillary: 121 mg/dL — ABNORMAL HIGH (ref 65–99)
Glucose-Capillary: 107 mg/dL — ABNORMAL HIGH (ref 65–99)
Glucose-Capillary: 124 mg/dL — ABNORMAL HIGH (ref 65–99)
Glucose-Capillary: 118 mg/dL — ABNORMAL HIGH (ref 65–99)
Glucose-Capillary: 117 mg/dL — ABNORMAL HIGH (ref 65–99)

## 2016-10-15 LAB — CBC
HCT: 38.6 % (ref 36.0–46.0)
HEMOGLOBIN: 12.4 g/dL (ref 12.0–15.0)
MCH: 28.9 pg (ref 26.0–34.0)
MCHC: 32.1 g/dL (ref 30.0–36.0)
MCV: 90 fL (ref 78.0–100.0)
Platelets: 397 10*3/uL (ref 150–400)
RBC: 4.29 MIL/uL (ref 3.87–5.11)
RDW: 12.9 % (ref 11.5–15.5)
WBC: 15.1 10*3/uL — ABNORMAL HIGH (ref 4.0–10.5)

## 2016-10-15 MED ORDER — ATROPINE SULFATE 1 MG/10ML IJ SOSY
PREFILLED_SYRINGE | INTRAMUSCULAR | Status: AC
  Filled 2016-10-15: qty 10

## 2016-10-15 NOTE — Progress Notes (Addendum)
Nutrition Follow-up  INTERVENTION:   Continue:  Vital AF 1.2 @ 60 ml/hr (1440 ml/day) Provides: 1728 kcal, 108 grams protein, and 1167 ml H2O.   NUTRITION DIAGNOSIS:   Inadequate oral intake related to inability to eat as evidenced by NPO status. Ongoing.   GOAL:   Patient will meet greater than or equal to 90% of their needs Met.   MONITOR:   TF tolerance, Vent status, I & O's  ASSESSMENT:   Pt with PMH of HTN admitted with R intraparenchymal brain hemorrhage.   1/31 trach/PEG Pt discussed during ICU rounds and with RN.  Pt on trach collar x 3 days May need shunt per MD notes  Diet Order:  Diet NPO time specified  Skin:  Reviewed, no issues  Last BM:  1/24  Height:   Ht Readings from Last 1 Encounters:  10/04/16 '5\' 1"'$  (1.549 m)    Weight:   Wt Readings from Last 1 Encounters:  10/15/16 149 lb 4 oz (67.7 kg)    Ideal Body Weight:  47.7 kg  BMI:  Body mass index is 28.2 kg/m.  Estimated Nutritional Needs:   Kcal:  1700-1900  Protein:  90-100 grams  Fluid:  > 1.7 L/day  EDUCATION NEEDS:   No education needs identified at this time  [place], [place], [place] Pager [phone] After Hours Pager

## 2016-10-15 NOTE — Progress Notes (Signed)
Stopped by to visit w/ family, but no one bedside w/ pt. Will check back later.    10/15/16 1200  Clinical Encounter Type  Visited With Patient not available  Visit Type Follow-up;Psychological support;Spiritual support;Social support  Referral From Chaplain   Ephraim Hamburgerynthia A Malinda Mayden, [address]plain

## 2016-10-15 NOTE — Progress Notes (Signed)
PULMONARY / CRITICAL CARE MEDICINE   Name: Anne Olsen MRN: [id number] DOB: 05/09/1976     ADMISSION DATE:  10/04/2016  REFERRING MD:  Benjiman Core, M.D. / EDP  CHIEF COMPLAINT:  Acute Intracranial Hemorrhage  BRIEF SUMMARY:  41 y.o. female pmhx of HTN who presented 1/25 to [facility] via EMS for headache, weakness, and AMS. CT head revealed an acute intraparenchymal hemorrhage in the right thalamus with a hematoma measuring 6.9 mL.   SUBJECTIVE:   RN reports no acute events.  Pt on ATC 35%.  NSGY raised ventric drain to 10.  Tmax 100.7  VITAL SIGNS: BP 117/80   Pulse 86   Temp 99.5 F (37.5 C) (Axillary)   Resp (!) 36   Ht 5\' 1"  (1.549 m)   Wt 149 lb 4 oz (67.7 kg)   LMP  (LMP Unknown)   SpO2 96%   BMI 28.20 kg/m   HEMODYNAMICS:    VENTILATOR SETTINGS: FiO2 (%):  [30 %-35 %] 35 %  INTAKE / OUTPUT: I/O last 3 completed shifts: In: 2872.2 [I.V.:612.2; NG/GT:2160; IV Piggyback:100] Out: 1948 [Urine:1515; Drains:433]  PHYSICAL EXAMINATION: General: ill appearing female in NAD on ATC HEENT: MM pink/moist, pupils 3mm, R, R ventric drain in place, trach midline with creamy yellow secretions PSY: unable to assess Neuro: does not follow commands, some spontaneous movement on R side CV: s1s2 rrr, no m/r/g PULM: even/non-labored, lungs bilaterally coarse with rhonchi WU:[id number], non-tender, bsx4 active  Extremities: warm/dry, trace generalized edema  Skin: no rashes or lesions    LABS: CBC Latest Ref Rng & Units 10/15/2016 10/13/2016 10/12/2016  WBC 4.0 - 10.5 K/uL 15.1(H) 17.3(H) 14.4(H)  Hemoglobin 12.0 - 15.0 g/dL 91.4 78.2 11.5(L)  Hematocrit 36.0 - 46.0 % 38.6 39.1 35.6(L)  Platelets 150 - 400 K/uL 397 299 289   BMP Latest Ref Rng & Units 10/13/2016 10/11/2016 10/09/2016  Glucose 65 - 99 mg/dL 956(O) 130(Q) 657(Q)  BUN 6 - 20 mg/dL 14 11 9   Creatinine 0.44 - 1.00 mg/dL 4.69 6.29 5.28  Sodium 135 - 145 mmol/L 138 139 138  Potassium 3.5 - 5.1 mmol/L 4.4 4.7 4.1  Chloride  101 - 111 mmol/L 102 106 108  CO2 22 - 32 mmol/L 25 24 24   Calcium 8.9 - 10.3 mg/dL 4.1(L) 2.4(M) 8.3(L)     STUDIES:  CT HEAD CODE STROKE 1/25 >  Acute intraparenchymal hemorrhage in the right thalamus with hematoma measuring 6.9 mL. Intraventricular penetration. Small amount of subarachnoid penetration. CTA of the neck and head 1/25 > do not show any atherosclerotic narrowing. The vessels are somewhat tortuous suggesting hypertension. No sign of aneurysm or vascular malformation to explain the right thalamic hemorrhage. PORT CXR 1/26 > Personally reviewed by me.Minimal patchy right lower lobe opacity. No other parenchymal infiltrate appreciated. No pleural effusion. Endotracheal tube in good position. Enteric feeding tube coursing below diaphragm. CT HEAD 1/26 > decreased volume of right thalamic hemorrhage, intraventricular and subarachnoid hemorrhage, progressive hydrocephalus EEG 1/31>This EEG is consistent with a focal area of cerebral dysfunction in the right hemisphere superimposed on a generalized nonspecific cerebral dysfunction as can be seen with medication effect among other causes. ECHO > 55-60%  MICROBIOLOGY: MRSA PCR 1/25:  Negative Blood 2/1 >>   ANTIBIOTICS: Unasyn 1/26 > 2/1  SIGNIFICANT EVENTS: 1/25 - Admit with AMS, intubated  1/26 - IVC drain placed  1/31 - PEG/Trach 2/04 - 24/7 ATC  LINES/TUBES: OETT 7.5 1/24 >1/31 Perc trach (DF) 1/31> FOLEY 1/24 > PIV  ASSESSMENT /  PLAN:  41 y.o. female status post acute intraparenchymal hemorrhage in the right thalamus with intraventricular penetration as well as a small amount of penetration into the subarachnoid space.  PULMONARY A: Acute Respiratory Failure secondary to inability to protect airway in setting of ICH  Tracheostomy Status - 1/31 per DF  P:   Continue ATC as tolerated  Pulmonary hygiene as able > mobilize, upright positioning  PRN suction  Intermittent CXR, repeat in am 2/6 Trach care per protocol    CARDIOVASCULAR A:  HTN P:  Continue lisinopril  PRN labetalol for SBP >140 ICU monitoring   RENAL A:   Hyperkalemia - resolved  P:   Trend BMP / UOP  Replace electrolytes as indicated   GASTROINTESTINAL A:   At risk protein calorie malnutrition  Dysphagia - s/p PEG 1/31 P:   Continue TF  PEG care per protocol  H2 blocker for SUP  HEMATOLOGIC A:   Leukocytosis P:  Trend CBC   INFECTIOUS A:   Febrile - suspect neuro related. No sig leukocytosis, no obvious source, pct neg x 2.  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 Will attempt to update on arrival 2/5.    - Inter-disciplinary family meet or Palliative Care meeting due by:  Ongoing     Canary BrimBrandi Ollis, NP-C [facility] Pgr: [phone] or if no answer [phone] 10/15/2016, 1:35 PM  Attending Note:  I have examined patient, reviewed labs, studies and notes. I have discussed the case with B Ollis, and I agree with the data and plans as amended above. 41 yo woman, debilitated from R thalamic bleed. S/p trach, PEG. She is having urinary retention. She still has an IVC drain in place. Plan is to continue her current care, raise her IVC drain over the next few days to see if it can be d/c versus need for a VP shunt.   Time spent: 25 minutes  Levy Pupaobert Shawnie Nicole, MD, PhD 10/15/2016, 2:05 PM [facility] [phone] or if no answer [phone]

## 2016-10-15 NOTE — Progress Notes (Signed)
Patient ID: Anne JerichoViviana Olsen, female   DOB: 1976-06-11, 41 y.o.   MRN: [id number] Patient is purposeful on the right and will follow commands at times. ventric patent at 5 cm. She had some increase in the size of her ventricles when her ventriculostomy was raised to 15. However it appears her neurologic exam continued to improve during that time. Therefore I don't know if this is truly hydrocephalus or ventriculomegaly. I raised her ventriculostomy to 10.

## 2016-10-16 LAB — BASIC METABOLIC PANEL
ANION GAP: 11 (ref 5–15)
ANION GAP: 9 (ref 5–15)
BUN: 20 mg/dL (ref 6–20)
BUN: 17 mg/dL (ref 6–20)
CALCIUM: 9.1 mg/dL (ref 8.9–10.3)
CALCIUM: 9.1 mg/dL (ref 8.9–10.3)
CO2: 23 mmol/L (ref 22–32)
CO2: 23 mmol/L (ref 22–32)
Chloride: 100 mmol/L — ABNORMAL LOW (ref 101–111)
Chloride: 103 mmol/L (ref 101–111)
Creatinine, Ser: 0.9 mg/dL (ref 0.44–1.00)
Creatinine, Ser: 0.8 mg/dL (ref 0.44–1.00)
GFR calc non Af Amer: >60 mL/min (ref >60)
GLUCOSE: 110 mg/dL — AB (ref 65–99)
Glucose, Bld: 126 mg/dL — ABNORMAL HIGH (ref 65–99)
POTASSIUM: 5.8 mmol/L — AB (ref 3.5–5.1)
Potassium: 6.1 mmol/L — ABNORMAL HIGH (ref 3.5–5.1)
SODIUM: 134 mmol/L — AB (ref 135–145)
Sodium: 135 mmol/L (ref 135–145)

## 2016-10-16 LAB — CBC
HEMATOCRIT: 39.3 % (ref 36.0–46.0)
HEMOGLOBIN: 12.7 g/dL (ref 12.0–15.0)
MCH: 29.3 pg (ref 26.0–34.0)
MCHC: 32.3 g/dL (ref 30.0–36.0)
MCV: 90.8 fL (ref 78.0–100.0)
Platelets: 396 10*3/uL (ref 150–400)
RBC: 4.33 MIL/uL (ref 3.87–5.11)
RDW: 12.9 % (ref 11.5–15.5)
WBC: 17.3 10*3/uL — AB (ref 4.0–10.5)

## 2016-10-16 LAB — GLUCOSE, CAPILLARY
GLUCOSE-CAPILLARY: 102 mg/dL — AB (ref 65–99)
GLUCOSE-CAPILLARY: 128 mg/dL — AB (ref 65–99)
GLUCOSE-CAPILLARY: 127 mg/dL — AB (ref 65–99)
GLUCOSE-CAPILLARY: 125 mg/dL — AB (ref 65–99)
Glucose-Capillary: 148 mg/dL — ABNORMAL HIGH (ref 65–99)
Glucose-Capillary: 122 mg/dL — ABNORMAL HIGH (ref 65–99)

## 2016-10-16 LAB — CULTURE, BLOOD (ROUTINE X 2)
CULTURE: NO GROWTH
Culture: NO GROWTH

## 2016-10-16 MED ORDER — SODIUM POLYSTYRENE SULFONATE 15 GM/60ML PO SUSP
15.0000 g | Freq: Once | ORAL | Status: AC
  Administered 2016-10-16: 15 g
  Filled 2016-10-16: qty 60

## 2016-10-16 NOTE — Progress Notes (Signed)
Met, visited, and prayed with pt's mom at bedside, also providing spiritual/emotional support. Mother was deferential about not speaking much English, but I explained slowly (and carefully) that I was there and had met her daughter and her husband Mora Appl when she came into the hospital in the emergency dept, and had been praying for her since as he asked me to do: to pray to God for her. She was grateful, and was glad when I said I could pray again now and she could just listen and pray inside herself if she wished. She was happy at the end of the prayer (stressing God's mercy). She  said she could understand every word, was glad to meet me, and hoped she could see me again. When pt had first been transported from ED to [facility],  [place] taken Muhammed by the [place] and shown him the prayer rugs, and today pt's mother said knew where the chapel was and had been praying there. Chaplain available for f/u.   10/16/16 1000  Clinical Encounter Type  Visited With Patient and family together  Visit Type Follow-up;Psychological support;Spiritual support;Social support;Critical Care  Referral From Chaplain  Spiritual Encounters  Spiritual Needs Prayer;Emotional  Stress Factors  Patient Stress Factors Health changes;Loss of control  Family Stress Factors Family relationships;Health changes;Loss of control   Gerrit Heck, Chaplain

## 2016-10-16 NOTE — Progress Notes (Addendum)
PULMONARY / CRITICAL CARE MEDICINE   Name: Ricardo JerichoViviana Kawasaki MRN: [id number] DOB: May 07, 1976     ADMISSION DATE:  10/04/2016  REFERRING MD:  Benjiman CoreNathan Pickering, M.D. / EDP  CHIEF COMPLAINT:  Acute Intracranial Hemorrhage  BRIEF SUMMARY:  41 y.o. female pmhx of HTN who [facility] for headache, weakness, and AMS. CT head revealed an acute intraparenchymal hemorrhage in the right thalamus with a hematoma measuring 6.9 mL.   SUBJECTIVE:    Remains on ATC Has not followed any commands per RN report  VITAL SIGNS: BP 117/79   Pulse 77   Temp (!) 100.4 F (38 C) (Axillary)   Resp (!) 29   Ht 5\' 1"  (1.549 m)   Wt 71.2 kg (156 lb 15.5 oz)   LMP  (LMP Unknown)   SpO2 100%   BMI 29.66 kg/m   HEMODYNAMICS:    VENTILATOR SETTINGS: FiO2 (%):  [28 %-35 %] 28 %  INTAKE / OUTPUT: I/O last 3 completed shifts: In: 2570 [I.V.:360; NG/GT:2160; IV Piggyback:50] Out: 1213 [Urine:750; Drains:463]  PHYSICAL EXAMINATION: General: ill appearing female in NAD on ATC HEENT: MM pink/moist, pupils 3mm, R, R ventric drain in place, trach midline with creamy yellow secretions PSY: unable to assess Neuro: does not follow commands, some spontaneous movement on R side CV: s1s2 rrr, no m/r/g PULM: even/non-labored, lungs bilaterally coarse with rhonchi NW:[id number]:soft, non-tender, bsx4 active  Extremities: warm/dry, trace generalized edema  Skin: no rashes or lesions    LABS: CBC Latest Ref Rng & Units 10/16/2016 10/15/2016 10/13/2016  WBC 4.0 - 10.5 K/uL 17.3(H) 15.1(H) 17.3(H)  Hemoglobin 12.0 - 15.0 g/dL 21.312.7 08.612.4 57.813.0  Hematocrit 36.0 - 46.0 % 39.3 38.6 39.1  Platelets 150 - 400 K/uL 396 397 299   BMP Latest Ref Rng & Units 10/16/2016 10/13/2016 10/11/2016  Glucose 65 - 99 mg/dL 469(G110(H) 295(M109(H) 841(L109(H)  BUN 6 - 20 mg/dL 20 14 11   Creatinine 0.44 - 1.00 mg/dL 2.440.90 0.100.66 2.720.68  Sodium 135 - 145 mmol/L 134(L) 138 139  Potassium 3.5 - 5.1 mmol/L 6.1(H) 4.4 4.7  Chloride 101 - 111 mmol/L 100(L) 102  106  CO2 22 - 32 mmol/L 23 25 24   Calcium 8.9 - 10.3 mg/dL 9.1 5.3(G8.7(L) 6.4(Q8.4(L)     STUDIES:  CT HEAD CODE STROKE 1/25 >  Acute intraparenchymal hemorrhage in the right thalamus with hematoma measuring 6.9 mL. Intraventricular penetration. Small amount of subarachnoid penetration. CTA of the neck and head 1/25 > do not show any atherosclerotic narrowing. The vessels are somewhat tortuous suggesting hypertension. No sign of aneurysm or vascular malformation to explain the right thalamic hemorrhage. PORT CXR 1/26 > Personally reviewed by me.Minimal patchy right lower lobe opacity. No other parenchymal infiltrate appreciated. No pleural effusion. Endotracheal tube in good position. Enteric feeding tube coursing below diaphragm. CT HEAD 1/26 > decreased volume of right thalamic hemorrhage, intraventricular and subarachnoid hemorrhage, progressive hydrocephalus EEG 1/31>This EEG is consistent with a focal area of cerebral dysfunction in the right hemisphere superimposed on a generalized nonspecific cerebral dysfunction as can be seen with medication effect among other causes. ECHO > 55-60%  MICROBIOLOGY: MRSA PCR 1/25:  Negative Blood 2/1 >>   ANTIBIOTICS: Unasyn 1/26 > 2/1  SIGNIFICANT EVENTS: 1/25 - Admit with AMS, intubated  1/26 - IVC drain placed  1/31 - PEG/Trach 2/04 - 24/7 ATC  LINES/TUBES: OETT 7.5 1/24 >1/31 Perc trach (DF) 1/31> FOLEY 1/24 > PIV  ASSESSMENT / PLAN:  41 y.o. female status  post acute intraparenchymal hemorrhage in the right thalamus with intraventricular penetration as well as a small amount of penetration into the subarachnoid space.  PULMONARY A: Acute Respiratory Failure secondary to inability to protect airway in setting of ICH  Tracheostomy Status - 1/31 per DF  P:   ATC 24x7 Pulmonary hygiene as able > mobilize, upright positioning  Suction prn Follow CXR Continue standard trach care  CARDIOVASCULAR A:  HTN P:  lisinopril PRN labetalol for  SBP >140  RENAL A:   Hyperkalemia P:   Follow BMP, UOP Hold lisinopril Kayexalate x1 Replace electrolytes as indicated   GASTROINTESTINAL A:   At risk protein calorie malnutrition  Dysphagia - s/p PEG 1/31 P:   Continue TF PEG care  H2 blocker for SUP  HEMATOLOGIC A:   Leukocytosis P:  Follow CBC  INFECTIOUS A:   Febrile - suspect neuro related. No sig leukocytosis, no obvious source, pct neg x 2.  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 Either remove vs place VP shunt Symmetrel as ordered   FAMILY  - Updates: No family in room on rounds 2/6  - Inter-disciplinary family meet or Palliative Care meeting due by:  Ongoing     Levy Pupa, MD, PhD 10/16/2016, 2:59 PM [facility] [phone] or if no answer [phone]

## 2016-10-16 NOTE — Progress Notes (Signed)
This note also relates to the following rows which could not be included: SpO2 - Cannot attach notes to unvalidated device data  Titrated to 8L, 30% on ATC. Sat 100%.

## 2016-10-16 NOTE — Progress Notes (Signed)
Pt with acute intracranial hemorrhage s/p trach and PEG on 10/10/16.   Currently with IVC drain; planning possible VP shunt on 10/18/16, per report.   Will continue to follow for discharge planning as pt progresses.    Quintella BatonJulie W. Rocsi Hazelbaker, RN, BSN  Trauma/Neuro ICU Case Manager [phone]

## 2016-10-17 LAB — BASIC METABOLIC PANEL
ANION GAP: 10 (ref 5–15)
Anion gap: 13 (ref 5–15)
BUN: 20 mg/dL (ref 6–20)
BUN: 18 mg/dL (ref 6–20)
CALCIUM: 8.7 mg/dL — AB (ref 8.9–10.3)
CHLORIDE: 98 mmol/L — AB (ref 101–111)
CO2: 21 mmol/L — ABNORMAL LOW (ref 22–32)
CO2: 22 mmol/L (ref 22–32)
CREATININE: 0.81 mg/dL (ref 0.44–1.00)
Calcium: 8.9 mg/dL (ref 8.9–10.3)
Chloride: 101 mmol/L (ref 101–111)
Creatinine, Ser: 0.81 mg/dL (ref 0.44–1.00)
GFR calc Af Amer: >60 mL/min (ref >60)
GFR calc Af Amer: >60 mL/min (ref >60)
GLUCOSE: 126 mg/dL — AB (ref 65–99)
Glucose, Bld: 113 mg/dL — ABNORMAL HIGH (ref 65–99)
POTASSIUM: 6.5 mmol/L — AB (ref 3.5–5.1)
Potassium: 6.1 mmol/L — ABNORMAL HIGH (ref 3.5–5.1)
SODIUM: 135 mmol/L (ref 135–145)
Sodium: 130 mmol/L — ABNORMAL LOW (ref 135–145)

## 2016-10-17 LAB — CBC
HCT: 40.4 % (ref 36.0–46.0)
Hemoglobin: 13 g/dL (ref 12.0–15.0)
MCH: 29.2 pg (ref 26.0–34.0)
MCHC: 32.2 g/dL (ref 30.0–36.0)
MCV: 90.8 fL (ref 78.0–100.0)
PLATELETS: 473 10*3/uL — AB (ref 150–400)
RBC: 4.45 MIL/uL (ref 3.87–5.11)
RDW: 13 % (ref 11.5–15.5)
WBC: 17.4 10*3/uL — AB (ref 4.0–10.5)

## 2016-10-17 LAB — URINALYSIS, ROUTINE W REFLEX MICROSCOPIC
Bilirubin Urine: NEGATIVE
GLUCOSE, UA: NEGATIVE mg/dL
HGB URINE DIPSTICK: NEGATIVE
Ketones, ur: NEGATIVE mg/dL
Nitrite: NEGATIVE
PH: 6 (ref 5.0–8.0)
Protein, ur: 30 mg/dL — AB
SPECIFIC GRAVITY, URINE: 1.014 (ref 1.005–1.030)
SQUAMOUS EPITHELIAL / LPF: NONE SEEN

## 2016-10-17 LAB — GLUCOSE, CAPILLARY
GLUCOSE-CAPILLARY: 122 mg/dL — AB (ref 65–99)
GLUCOSE-CAPILLARY: 142 mg/dL — AB (ref 65–99)
GLUCOSE-CAPILLARY: 123 mg/dL — AB (ref 65–99)
GLUCOSE-CAPILLARY: 118 mg/dL — AB (ref 65–99)
GLUCOSE-CAPILLARY: 135 mg/dL — AB (ref 65–99)
Glucose-Capillary: 130 mg/dL — ABNORMAL HIGH (ref 65–99)

## 2016-10-17 MED ORDER — SODIUM POLYSTYRENE SULFONATE 15 GM/60ML PO SUSP
30.0000 g | Freq: Once | ORAL | Status: AC
  Administered 2016-10-17: 30 g
  Filled 2016-10-17: qty 120

## 2016-10-17 MED ORDER — SODIUM POLYSTYRENE SULFONATE 15 GM/60ML PO SUSP
30.0000 g | Freq: Three times a day (TID) | ORAL | Status: AC
  Administered 2016-10-17 (×2): 30 g
  Filled 2016-10-17 (×2): qty 120

## 2016-10-17 NOTE — Progress Notes (Signed)
   Persistent hyperkalemia   Recent Labs Lab 10/13/16 0243 10/16/16 0327 10/16/16 1814 10/17/16 0403 10/17/16 2011  NA 138 134* 135 135 130*  K 4.4 6.1* 5.8* 6.1* 6.5*  CL 102 100* 103 101 98*  CO2 25 23 23  21* 22  GLUCOSE 109* 110* 126* 113* 126*  BUN 14 20 17 20 18   CREATININE 0.66 0.90 0.80 0.81 0.81  CALCIUM 8.7* 9.1 9.1 8.7* 8.9  MG 1.9  --   --   --   --   PHOS 3.2  --   --   --   --    Repeat kayealate done by RN  Will repeat kayexalate  Morning team to consider central line  Dr. Kalman ShanMurali Deniah Saia, M.D., [facility].C.P [facility] Critical Care Medicine Staff Physician [facility] Pager: [phone], If no answer or between  15:00h - 7:00h: call [phone]  10/17/2016 10:18 PM

## 2016-10-17 NOTE — Progress Notes (Signed)
Patient ID: Anne JerichoViviana Olsen, female   DOB: 1976/06/19, 41 y.o.   MRN: [id number] Patient seen and examined. No change in neurological exam. Minimal response to painful stimuli. Some flexion on the right. Some ocular bobbing on the left. Pupils are small and sluggish. No significant movement on the left. Ventriculostomy patent. We'll raised to 20 cm today. The difficulty is weaning the ventriculostomy. I don't think she is an excellent candidate for ventriculoperitoneal shunting based on her current neurologic status and the prognosis for improvement.

## 2016-10-17 NOTE — Progress Notes (Signed)
PULMONARY / CRITICAL CARE MEDICINE   Name: Anne JerichoViviana Guillette MRN: [id number] DOB: 1975-09-20     ADMISSION DATE:  10/04/2016  REFERRING MD:  Benjiman CoreNathan Pickering, M.D. / EDP  CHIEF COMPLAINT:  Acute Intracranial Hemorrhage  BRIEF SUMMARY:  41 y.o. female pmhx of HTN who [facility] for headache, weakness, and AMS. CT head revealed an acute intraparenchymal hemorrhage in the right thalamus with a hematoma measuring 6.9 mL.   SUBJECTIVE:    Remains on ATC.  IVC drain remains. No acute change.  Minimal BM from kayexalate yesterday.    VITAL SIGNS: BP 111/76   Pulse 81   Temp 98.5 F (36.9 C) (Axillary)   Resp (!) 33   Ht 5\' 1"  (1.549 m)   Wt 70.4 kg (155 lb 3.3 oz)   LMP  (LMP Unknown)   SpO2 96%   BMI 29.33 kg/m   HEMODYNAMICS:    VENTILATOR SETTINGS: FiO2 (%):  [28 %] 28 %  INTAKE / OUTPUT: I/O last 3 completed shifts: In: 2790 [I.V.:360; Other:220; NG/GT:2160; IV Piggyback:50] Out: 293 [Drains:293]  PHYSICAL EXAMINATION: General:  Chronically ill appearing female, NAD on ATC  HEENT: MM pink/moist, trach c/d  Neuro:  Does not follow commands, some spontaneous movement R arm CV: s1s2 rrr, no m/r/g PULM: resps even non labored on vent, few scattered rhonchi  IO:NGEXGI:soft, non-tender, bsx4 active  Extremities: warm/dry, no edema  Skin: no rashes or lesions   LABS: CBC Latest Ref Rng & Units 10/17/2016 10/16/2016 10/15/2016  WBC 4.0 - 10.5 K/uL 17.4(H) 17.3(H) 15.1(H)  Hemoglobin 12.0 - 15.0 g/dL 52.813.0 41.312.7 24.412.4  Hematocrit 36.0 - 46.0 % 40.4 39.3 38.6  Platelets 150 - 400 K/uL 473(H) 396 397   BMP Latest Ref Rng & Units 10/17/2016 10/16/2016 10/16/2016  Glucose 65 - 99 mg/dL 010(U113(H) 725(D126(H) 664(Q110(H)  BUN 6 - 20 mg/dL 20 17 20   Creatinine 0.44 - 1.00 mg/dL 0.340.81 7.420.80 5.950.90  Sodium 135 - 145 mmol/L 135 135 134(L)  Potassium 3.5 - 5.1 mmol/L 6.1(H) 5.8(H) 6.1(H)  Chloride 101 - 111 mmol/L 101 103 100(L)  CO2 22 - 32 mmol/L 21(L) 23 23  Calcium 8.9 - 10.3 mg/dL 6.3(O8.7(L)  9.1 9.1     STUDIES:  CT HEAD CODE STROKE 1/25 >  Acute intraparenchymal hemorrhage in the right thalamus with hematoma measuring 6.9 mL. Intraventricular penetration. Small amount of subarachnoid penetration. CTA of the neck and head 1/25 > do not show any atherosclerotic narrowing. The vessels are somewhat tortuous suggesting hypertension. No sign of aneurysm or vascular malformation to explain the right thalamic hemorrhage. PORT CXR 1/26 > Personally reviewed by me.Minimal patchy right lower lobe opacity. No other parenchymal infiltrate appreciated. No pleural effusion. Endotracheal tube in good position. Enteric feeding tube coursing below diaphragm. CT HEAD 1/26 > decreased volume of right thalamic hemorrhage, intraventricular and subarachnoid hemorrhage, progressive hydrocephalus EEG 1/31>This EEG is consistent with a focal area of cerebral dysfunction in the right hemisphere superimposed on a generalized nonspecific cerebral dysfunction as can be seen with medication effect among other causes. ECHO > 55-60%  MICROBIOLOGY: MRSA PCR 1/25:  Negative Blood 2/1 >>   ANTIBIOTICS: Unasyn 1/26 > 2/1  SIGNIFICANT EVENTS: 1/25 - Admit with AMS, intubated  1/26 - IVC drain placed  1/31 - PEG/Trach 2/04 - 24/7 ATC  LINES/TUBES: OETT 7.5 1/24 >1/31 Perc trach (DF) 1/31>   ASSESSMENT / PLAN:  41 y.o. female status post acute intraparenchymal hemorrhage in the right thalamus with  intraventricular penetration as well as a small amount of penetration into the subarachnoid space.  PULMONARY A: Acute Respiratory Failure secondary to inability to protect airway in setting of ICH  Tracheostomy Status - 1/31 per DF  P:   Cont ATC 24/7 as tol  pulm hygiene - mobilize Suction PRN  Intermittent CXR    CARDIOVASCULAR A:  HTN P:  PRN labetalol  Holding lisinopril   RENAL A:   Hyperkalemia  P:   Repeat kayexalate  F/u chem    GASTROINTESTINAL A:   At risk protein calorie  malnutrition  Dysphagia - s/p PEG 1/31 P:   TF via PEG  pepcid    HEMATOLOGIC A:   Leukocytosis P:  F/u CBC   INFECTIOUS A:   Febrile - suspect neuro related. No sig leukocytosis, no obvious source, pct neg x 2.  Blood cultures neg. 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   - Inter-disciplinary family meet or Palliative Care meeting due by:  Ongoing   Cc time   Dirk Dress, NP 10/17/2016  9:09 AM Pager: [phone] or [phone]  Attending Note:  I have examined patient, reviewed labs, studies and notes. I have discussed the case with Jasper Riling, and I agree with the data and plans as amended above.   41 year old woman with acute respiratory failure due to altered mental status in the setting of a hypertensive right thalamic bleed. She hasn't intraventricular catheter drain in place. Tracheostomy and PEG placement have been performed to facilitate prolonged care to look for neurological improvement. To date on my exam she has not followed any commands. She will move her bilateral lower and upper extremities. The movement appears to be purposeful. Her lung exam is clear. She has had hyperkalemia over the last few days. Her lisinopril was held and Kayexalate was given 1. We will continue to hold lisinopril, repeat her Kayexalate and follow potassium for improvement. Continue to follow mental status. Her IVC drain is to be clamped today. If she tolerates this then the drain can likely be removed in the near future. If not then she may need a VP shunt.  Independent  critical care time is 32 minutes.   Levy Pupa, MD, PhD 10/17/2016, 10:52 AM  Pulmonary and Critical Care [phone] or if no answer [phone]

## 2016-10-17 NOTE — Progress Notes (Signed)
RT removed sutures from trach without complication. No respiratory distress noted at this time. RT will continue to monitor as needed.

## 2016-10-17 NOTE — Progress Notes (Signed)
Stopped by to visit w/ family, but none in rm. Offered silent prayer for pt. Chaplain available for f/u.   10/17/16 1100  Clinical Encounter Type  Visited With Patient not available  Visit Type Follow-up;Psychological support;Spiritual support;Social support;Critical Care  Referral From Chaplain   Ephraim Hamburgerynthia A Cadel Stairs, [place]

## 2016-10-17 NOTE — Progress Notes (Signed)
  CRITICAL VALUE ALERT  Critical value received:  K- 6.5  Date of notification:  10/17/16  Time of notification:  2104  Critical value read back:Yes.    Nurse who received alert:  Hector BrunswickE. Marquavious Nazar RN  MD notified (1st page): Ramaswamy  Time of first page: 2109  MD notified (2nd page):  Time of second page:  Responding MD: Marchelle Gearingamaswamy  Time MD responded:  2215  New orders received and carried out. Dicie BeamFrazier, Melonie Germani RN BSN

## 2016-10-18 ENCOUNTER — Inpatient Hospital Stay (HOSPITAL_COMMUNITY): Payer: 59

## 2016-10-18 LAB — GLUCOSE, CAPILLARY
GLUCOSE-CAPILLARY: 125 mg/dL — AB (ref 65–99)
GLUCOSE-CAPILLARY: 94 mg/dL (ref 65–99)
GLUCOSE-CAPILLARY: 122 mg/dL — AB (ref 65–99)
Glucose-Capillary: 120 mg/dL — ABNORMAL HIGH (ref 65–99)
Glucose-Capillary: 118 mg/dL — ABNORMAL HIGH (ref 65–99)

## 2016-10-18 LAB — BASIC METABOLIC PANEL
ANION GAP: 12 (ref 5–15)
Anion gap: 10 (ref 5–15)
BUN: 21 mg/dL — ABNORMAL HIGH (ref 6–20)
BUN: 22 mg/dL — AB (ref 6–20)
CHLORIDE: 100 mmol/L — AB (ref 101–111)
CO2: 24 mmol/L (ref 22–32)
CO2: 27 mmol/L (ref 22–32)
Calcium: 8.8 mg/dL — ABNORMAL LOW (ref 8.9–10.3)
Calcium: 9 mg/dL (ref 8.9–10.3)
Chloride: 101 mmol/L (ref 101–111)
Creatinine, Ser: 0.82 mg/dL (ref 0.44–1.00)
Creatinine, Ser: 0.93 mg/dL (ref 0.44–1.00)
GFR calc Af Amer: >60 mL/min (ref >60)
GFR calc non Af Amer: >60 mL/min (ref >60)
GLUCOSE: 116 mg/dL — AB (ref 65–99)
Glucose, Bld: 120 mg/dL — ABNORMAL HIGH (ref 65–99)
POTASSIUM: 5.1 mmol/L (ref 3.5–5.1)
POTASSIUM: 5.9 mmol/L — AB (ref 3.5–5.1)
SODIUM: 137 mmol/L (ref 135–145)
SODIUM: 137 mmol/L (ref 135–145)

## 2016-10-18 MED ORDER — ORAL CARE MOUTH RINSE
15.0000 mL | Freq: Two times a day (BID) | OROMUCOSAL | Status: DC
  Administered 2016-10-19 – 2016-11-09 (×37): 15 mL via OROMUCOSAL

## 2016-10-18 MED ORDER — DEXTROSE 5 % IV SOLN
1.0000 g | INTRAVENOUS | Status: AC
  Administered 2016-10-18 – 2016-10-24 (×7): 1 g via INTRAVENOUS
  Filled 2016-10-18 (×7): qty 10

## 2016-10-18 MED ORDER — CHLORHEXIDINE GLUCONATE 0.12 % MT SOLN
15.0000 mL | Freq: Two times a day (BID) | OROMUCOSAL | Status: DC
  Administered 2016-10-18 – 2016-11-15 (×56): 15 mL via OROMUCOSAL
  Filled 2016-10-18 (×46): qty 15

## 2016-10-18 NOTE — Progress Notes (Signed)
PULMONARY / CRITICAL CARE MEDICINE   Name: Anne Olsen MRN: [id number] DOB: 10-09-1975     ADMISSION DATE:  10/04/2016  REFERRING MD:  Benjiman Core, M.D. / EDP  CHIEF COMPLAINT:  Acute Intracranial Hemorrhage  BRIEF SUMMARY:  41 y.o. female pmhx of HTN who presented 1/25 to [facility] via EMS for headache, weakness, and AMS. CT head revealed an acute intraparenchymal hemorrhage in the right thalamus with a hematoma measuring 6.9 mL.   SUBJECTIVE:    Remains on ATC, increase in secretions overnight. EVD remains at 15   VITAL SIGNS: BP 117/90   Pulse 66   Temp 99.4 F (37.4 C) (Axillary)   Resp (!) 30   Ht 5\' 1"  (1.549 m)   Wt 67.5 kg (148 lb 13 oz)   LMP  (LMP Unknown)   SpO2 97%   BMI 28.12 kg/m   HEMODYNAMICS:    VENTILATOR SETTINGS: FiO2 (%):  [28 %] 28 %  INTAKE / OUTPUT: I/O last 3 completed shifts: In: 2750 [I.V.:360; Other:180; NG/GT:2160; IV Piggyback:50] Out: 2291 [Urine:1800; Drains:291; Stool:200]  PHYSICAL EXAMINATION: General:  Chronically ill appearing female, no distress  HEENT: Trach in place (clean, dry, intact) Neuro: Eyes closed, does not follow commands, moves RUE spontaneously  CV: RRR, no MRG, NI SI/S2  PULM: On ATC, unlabored, scattered rhonchi  GI non-tender, non-distended, active bowel sounds  Extremities: warm/dry, intact  Skin: no rashes or lesions   LABS: CBC Latest Ref Rng & Units 10/17/2016 10/16/2016 10/15/2016  WBC 4.0 - 10.5 K/uL 17.4(H) 17.3(H) 15.1(H)  Hemoglobin 12.0 - 15.0 g/dL 40.9 81.1 91.4  Hematocrit 36.0 - 46.0 % 40.4 39.3 38.6  Platelets 150 - 400 K/uL 473(H) 396 397   BMP Latest Ref Rng & Units 10/18/2016 10/17/2016 10/17/2016  Glucose 65 - 99 mg/dL 782(N) 562(Z) 308(M)  BUN 6 - 20 mg/dL 57(Q) 18 20  Creatinine 0.44 - 1.00 mg/dL 4.69 6.29 5.28  Sodium 135 - 145 mmol/L 137 130(L) 135  Potassium 3.5 - 5.1 mmol/L 5.1 6.5(HH) 6.1(H)  Chloride 101 - 111 mmol/L 101 98(L) 101  CO2 22 - 32 mmol/L 24 22 21(L)  Calcium 8.9 - 10.3  mg/dL 4.1(L) 8.9 2.4(M)     STUDIES:  CT HEAD CODE STROKE 1/25 >  Acute intraparenchymal hemorrhage in the right thalamus with hematoma measuring 6.9 mL. Intraventricular penetration. Small amount of subarachnoid penetration. CTA of the neck and head 1/25 > do not show any atherosclerotic narrowing. The vessels are somewhat tortuous suggesting hypertension. No sign of aneurysm or vascular malformation to explain the right thalamic hemorrhage. PORT CXR 1/26 > Personally reviewed by me.Minimal patchy right lower lobe opacity. No other parenchymal infiltrate appreciated. No pleural effusion. Endotracheal tube in good position. Enteric feeding tube coursing below diaphragm. CT HEAD 1/26 > decreased volume of right thalamic hemorrhage, intraventricular and subarachnoid hemorrhage, progressive hydrocephalus EEG 1/31>This EEG is consistent with a focal area of cerebral dysfunction in the right hemisphere superimposed on a generalized nonspecific cerebral dysfunction as can be seen with medication effect among other causes. ECHO > 55-60% CXR 2/8 > Bibasilar atelectasis, more on the left than on the right, small focus PNA in the left base cannot be excluded radiographically   MICROBIOLOGY: MRSA PCR 1/25:  Negative Blood 2/1 > Negative  U/A 2/7 > Positive Bacteria/Leuko Urine Culture 2/8 > Blood 2/8 > Sputum 2/8 >   ANTIBIOTICS: Unasyn 1/26 > 2/1 Rocephin 2/8 >   SIGNIFICANT EVENTS: 1/25 - Admit with AMS, intubated  1/26 -  IVC drain placed  1/31 - PEG/Trach 2/04 - 24/7 ATC  LINES/TUBES: OETT 7.5 1/24 >1/31 Perc trach (DF) 1/31> PEG 1/31 >   ASSESSMENT / PLAN:  41 y.o. female status post acute intraparenchymal hemorrhage in the right thalamus with intraventricular penetration as well as a small amount of penetration into the subarachnoid space.  PULMONARY A: Acute Respiratory Failure secondary to inability to protect airway in setting of ICH  Tracheostomy Status - 1/31 per DF  P:    Continue Trach Collar Trials as tolerated  Pulmonary Hygiene Suction PRN  Routine Trach Care  Intermittent CXR   CARDIOVASCULAR A:  HTN P:  PRN labetalol  Holding lisinopril   RENAL A:   Hyperkalemia - improving  P:   Trend BMP Replace electrolytes as needed  GASTROINTESTINAL A:   At risk protein calorie malnutrition  Dysphagia - s/p PEG 1/31 P:   TF via PEG  Pepcid   HEMATOLOGIC A:   Leukocytosis P:  Trend CBC  INFECTIOUS A:   Febrile - Increase in WBC, U/A positive  S/p 7 days Unasyn  UTI  P:   Continue to follow off abx  Trend WBC and fever curve  Send Urine, Sputum, and Blood Cultures  Start Rocephin   ENDOCRINE A:   Hypoglycemia  P:   SSI   NEUROLOGIC A:   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   - Inter-disciplinary family meet or Palliative Care meeting due by:  Ongoing   Cc time 30mins   Jovita KussmaulKatalina Eubanks, AG-ACNP [place] [facility]  Pgr: [phone]  PCCM Pgr: [phone]  Attending Note:  I have examined patient, reviewed labs, studies and notes. I have discussed the case with Idolina PrimerK Eubanks, and I agree with the data and plans as amended above. 41 yo woman, R thalamic bleed with intraventricular extension, IVC placement. She has a trach and PEG. She has had hyperkalemia. Also over the last 24 h some increasing WBC, UA with evidence UTI. We will send resp and urine cx's. Start ceftriaxone today. Still trying to determine whether she will have VP shunt vs d/c the IVC. I believe that the family wants to remains aggressive given the fact that they elected for trach.   Levy Pupaobert Greidys Deland, MD, PhD 10/18/2016, 3:20 PM [place] [facility] [phone] or if no answer [phone]

## 2016-10-18 NOTE — Progress Notes (Signed)
Patient ID: Anne Olsen, female   DOB: August 26, 1976, 41 y.o.   MRN: [id number] Nurses state that patient is somewhat more somnolent. I really don't know if that is because the ventriculostomy was raised to 20 cm. She is being evaluated for some type of infection by CCM. Ventriculostomy is patent. CSF is clear.  This is still a difficult moral an ethical dilemma. Her mother states she would not want to be like this. It is difficult to know if placing a permanent ventriculoperitoneal shunt is appropriate given her neurologic status and prognosis for meaningful recovery.  I have recommended an MRI of the brain to evaluate residual damage to the brainstem little more closely.

## 2016-10-19 ENCOUNTER — Inpatient Hospital Stay (HOSPITAL_COMMUNITY): Payer: 59

## 2016-10-19 LAB — GLUCOSE, CAPILLARY
GLUCOSE-CAPILLARY: 99 mg/dL (ref 65–99)
GLUCOSE-CAPILLARY: 108 mg/dL — AB (ref 65–99)
Glucose-Capillary: 99 mg/dL (ref 65–99)
Glucose-Capillary: 117 mg/dL — ABNORMAL HIGH (ref 65–99)
Glucose-Capillary: 123 mg/dL — ABNORMAL HIGH (ref 65–99)
Glucose-Capillary: 116 mg/dL — ABNORMAL HIGH (ref 65–99)

## 2016-10-19 LAB — BASIC METABOLIC PANEL
Anion gap: 12 (ref 5–15)
BUN: 23 mg/dL — AB (ref 6–20)
CHLORIDE: 101 mmol/L (ref 101–111)
CO2: 25 mmol/L (ref 22–32)
CREATININE: 0.84 mg/dL (ref 0.44–1.00)
Calcium: 8.9 mg/dL (ref 8.9–10.3)
GFR calc Af Amer: >60 mL/min (ref >60)
GFR calc non Af Amer: >60 mL/min (ref >60)
Glucose, Bld: 99 mg/dL (ref 65–99)
Potassium: 5.4 mmol/L — ABNORMAL HIGH (ref 3.5–5.1)
Sodium: 138 mmol/L (ref 135–145)

## 2016-10-19 LAB — CBC
HCT: 41 % (ref 36.0–46.0)
Hemoglobin: 12.9 g/dL (ref 12.0–15.0)
MCH: 29.3 pg (ref 26.0–34.0)
MCHC: 31.5 g/dL (ref 30.0–36.0)
MCV: 93.2 fL (ref 78.0–100.0)
PLATELETS: 466 10*3/uL — AB (ref 150–400)
RBC: 4.4 MIL/uL (ref 3.87–5.11)
RDW: 13.2 % (ref 11.5–15.5)
WBC: 13.6 10*3/uL — ABNORMAL HIGH (ref 4.0–10.5)

## 2016-10-19 LAB — MAGNESIUM: Magnesium: 2.1 mg/dL (ref 1.7–2.4)

## 2016-10-19 LAB — PHOSPHORUS: Phosphorus: 4.7 mg/dL — ABNORMAL HIGH (ref 2.5–4.6)

## 2016-10-19 MED ORDER — AMANTADINE HCL 50 MG/5ML PO SYRP
200.0000 mg | ORAL_SOLUTION | Freq: Two times a day (BID) | ORAL | Status: DC
  Administered 2016-10-19 – 2016-11-15 (×52): 200 mg
  Filled 2016-10-19 (×58): qty 20

## 2016-10-19 MED ORDER — AMANTADINE HCL 100 MG PO CAPS
200.0000 mg | ORAL_CAPSULE | Freq: Two times a day (BID) | ORAL | Status: DC
  Filled 2016-10-19: qty 2

## 2016-10-19 NOTE — Progress Notes (Signed)
Spoke with Neurosurgery about prognosis and whether they had spoken with family recently. MD stated he will not be the one to have that conversation with the family. Spoke with CCM who states they will get in touch with Nsg and discuss future plans. CCM will have to call family and talk about code status. Patient's [place] is in [place] for ten days on business. Have not seen Mom yet today. Monitoring patient closely. Khaleb Broz, [place] ScrapeSarah E, RN

## 2016-10-19 NOTE — Progress Notes (Signed)
PULMONARY / CRITICAL CARE MEDICINE   Name: Anne Olsen MRN: [id number] DOB: 04-16-1976     ADMISSION DATE:  10/04/2016  REFERRING MD:  Benjiman Core, M.D. / EDP  CHIEF COMPLAINT:  Acute Intracranial Hemorrhage  BRIEF SUMMARY:  41 y.o. female PMH of HTN who presented 1/25 to [facility] via EMS for headache, weakness, and AMS. CT head revealed an acute intraparenchymal hemorrhage in the right thalamus with a hematoma measuring 6.9 mL.   SUBJECTIVE:  RN reports mother has been making statements such as "she wouldn't want to live like this".  Pt's husband has gone to [place] for business and is expected to be gone for 10 days.    VITAL SIGNS: BP 120/73   Pulse 66   Temp 98.4 F (36.9 C) (Axillary)   Resp (!) 28   Ht 5\' 1"  (1.549 m)   Wt 154 lb 8.7 oz (70.1 kg)   LMP  (LMP Unknown)   SpO2 98%   BMI 29.20 kg/m   HEMODYNAMICS:    VENTILATOR SETTINGS: FiO2 (%):  [28 %] 28 %  INTAKE / OUTPUT: I/O last 3 completed shifts: In: 2735 [I.V.:345; Other:130; NG/GT:2110; IV Piggyback:150] Out: 2893 [Urine:2050; Drains:367; Stool:476]  PHYSICAL EXAMINATION: General:  Ill appearing female in NAD HEENT: MM pink/moist, trach midline c/d/i  Neuro: no response to verbal stimuli, + corneal response, spontaneous movement of RUE noted, no follow commands CV: s1s2 rrr, no m/r/g PULM: even/non-labored, lungs bilaterally clear XL:KGMW, non-tender, bsx4 active  Extremities: warm/dry, trace generalized edema  Skin: no rashes or lesions    LABS: CBC Latest Ref Rng & Units 10/19/2016 10/17/2016 10/16/2016  WBC 4.0 - 10.5 K/uL 13.6(H) 17.4(H) 17.3(H)  Hemoglobin 12.0 - 15.0 g/dL 10.2 72.5 36.6  Hematocrit 36.0 - 46.0 % 41.0 40.4 39.3  Platelets 150 - 400 K/uL 466(H) 473(H) 396   BMP Latest Ref Rng & Units 10/19/2016 10/18/2016 10/18/2016  Glucose 65 - 99 mg/dL 99 440(H) 474(Q)  BUN 6 - 20 mg/dL 59(D) 63(O) 75(I)  Creatinine 0.44 - 1.00 mg/dL 4.33 2.95 1.88  Sodium 135 - 145 mmol/L 138 137 137   Potassium 3.5 - 5.1 mmol/L 5.4(H) 5.9(H) 5.1  Chloride 101 - 111 mmol/L 101 100(L) 101  CO2 22 - 32 mmol/L 25 27 24   Calcium 8.9 - 10.3 mg/dL 8.9 9.0 4.1(Y)     STUDIES:  CT HEAD CODE STROKE 1/25 >  Acute intraparenchymal hemorrhage in the right thalamus with hematoma measuring 6.9 mL. Intraventricular penetration. Small amount of subarachnoid penetration. CTA of the neck and head 1/25 > do not show any atherosclerotic narrowing. The vessels are somewhat tortuous suggesting hypertension. No sign of aneurysm or vascular malformation to explain the right thalamic hemorrhage. PORT CXR 1/26 > Personally reviewed by me.Minimal patchy right lower lobe opacity. No other parenchymal infiltrate appreciated. No pleural effusion. Endotracheal tube in good position. Enteric feeding tube coursing below diaphragm. CT HEAD 1/26 > decreased volume of right thalamic hemorrhage, intraventricular and subarachnoid hemorrhage, progressive hydrocephalus EEG 1/31>This EEG is consistent with a focal area of cerebral dysfunction in the right hemisphere superimposed on a generalized nonspecific cerebral dysfunction as can be seen with medication effect among other causes. ECHO > 55-60% CXR 2/8 > Bibasilar atelectasis, more on the left than on the right, small focus PNA in the left base cannot be excluded radiographically   MICROBIOLOGY: MRSA PCR 1/25:  Negative Blood 2/1 > Negative  U/A 2/7 > Positive Bacteria/Leuko Urine Culture 2/8 > klebsiella PNA >>  Blood  2/8 > Sputum 2/8 >   ANTIBIOTICS: Unasyn 1/26 > 2/1 Rocephin 2/8 >   SIGNIFICANT EVENTS: 1/25 - Admit with AMS, intubated  1/26 - IVC drain placed  1/31 - PEG/Trach 2/04 - 24/7 ATC  LINES/TUBES: OETT 7.5 1/24 >1/31 Perc trach (DF) 1/31> PEG 1/31 >   ASSESSMENT / PLAN:  41 y.o. female status post acute intraparenchymal hemorrhage in the right thalamus with intraventricular penetration as well as a small amount of penetration into the  subarachnoid space.  PULMONARY A: Acute Respiratory Failure secondary to inability to protect airway in setting of ICH  Tracheostomy Status - 1/31 per DF  P:   Trach collar as tolerated  NTS PRN  Trach care per protocol  Intermittent CXR  CARDIOVASCULAR A:  HTN P:  Tele monitoring  PRN labetalol   RENAL A:   Hyperkalemia   P:   Trend BMP / UOP  Replace electrolytes as indicated  GASTROINTESTINAL A:   At risk protein calorie malnutrition  Dysphagia - s/p PEG 1/31 P:   Continue TF  PEG care per protocol   HEMATOLOGIC A:   Leukocytosis P:  Trend CBC  INFECTIOUS A:   Febrile - Increase in WBC, U/A positive  S/p 7 days Unasyn  Klebsiella UTI  P:   Trend WBC / fever curve  Follow cultures  Empiric rocephin  D2/x abx   ENDOCRINE A:   Hypoglycemia  P:   SSI  NEUROLOGIC A:   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 Yetta BarreJones to discuss prognosis, remove IVC vs shunt placement  Symmetrel  Palliative care following   FAMILY  - Updates:  No family available 2/9.    - Inter-disciplinary family meet or Palliative Care meeting due by:  Ongoing   NP CC Time: 30 minutes   Canary BrimBrandi Ollis, NP-C [place] [facility] Pgr: [phone] or if no answer [phone] 10/19/2016, 12:34 PM  Attending Note:  I have examined patient, reviewed labs, studies and notes. I have discussed the case with B Ollis, and I agree with the data and plans as amended above. 41 year old woman with a right thalamic bleed and intraventricular extension and IVC drain placement. She is profoundly debilitated and has a tracheostomy and PEG tube. On evaluation her neurological exam has not changed for the last week-she does not respond to commands she is able to move her upper and lower extremities but not purposefully. She has  persistent hyperkalemia of unclear etiology and has been treated with Kayexalate. Most importantly she still has a ventricular drain in place. It does not look like the drainage will decrease to a point where this can be clamped discontinued. Dr. Yetta BarreJones with neurosurgery has indicated that he does not want to place a VP shunt given the patient's overall poor prognosis and the low likelihood that shunting would change her overall outcome. I agree with this assessment and believe that the patient be better served with a transition to comfort care. Unfortunately to date her husband healthcare power of attorney has indicated that all measures should be taken to prolong life and to follow for possible improvement. That is why she underwent tracheostomy and PEG. If he maintains this stance then there is no way to transition her out of the intensive care unit without shunt placement. I believe we need to revisit goals of  care with the family, explained Dr. Barnett Applebaum recommendations and possibly put him in communication with them if they have questions. I will recommend that aggressive care be discontinued and we concentrated on her comfort.  Independent critical care time is 35 minutes.   Levy Pupa, MD, PhD 10/19/2016, 2:06 PM  Pulmonary and Critical Care [phone] or if no answer [phone]

## 2016-10-19 NOTE — Progress Notes (Signed)
Stopped by to visit w/ pt's family, but none present. Offered silent prayer for pt. Note charted disagreement that husband (w/ healthcare power of atty) wishes to consider aggressive care to look for possibility of improvement, but mother says pt would not want to be like this. Med staff still considers prognosis poor, and wishes to continue revisiting goals of care w/ family. Following husband's wishes precludes transition to comfort care. Chaplain available for f/u.   10/19/16 1400  Clinical Encounter Type  Visited With Patient not available  Visit Type Follow-up;Psychological support;Spiritual support;Social support  Referral From Chaplain   Ephraim Hamburgerynthia A Tandy Grawe, [address]haplain

## 2016-10-19 NOTE — Progress Notes (Signed)
Patient following more commands at this time. She will track with her eyes, squeeze moderately with her right hand, weakly with left hand, and push her right foot against resistance. We needed to open her eyes for her to become more alert and follow commands for us. Family is at the bedside at this time during this reassessment. Kayleanna Lorman, [place] ScrapeSarah E, RN

## 2016-10-19 NOTE — Progress Notes (Signed)
Patient ID: Anne JerichoViviana Olsen, female   DOB: 07-05-1976, 41 y.o.   MRN: [id number] No change in exam. pupils unreactive, some nonpurposeful mvmt on R, no mvmt on L, +corneals  MRI reviewed - hemorrhage involves midbrain, I think prognosis for meaningful recovery is poor  ventric patent

## 2016-10-19 NOTE — Evaluation (Signed)
Physical Therapy Evaluation Patient Details Name: Anne Olsen MRN: [id number] DOB: [birth year] Today's Date: 10/19/2016   History of Present Illness  pt presents with Intraparenchymal Hemorrhage in R Thalamus.  pt intubated on admit, trached on 1/31.  pt also with Ventricular drain.  No PMH, except possible undiagnosised HTN.    Clinical Impression  Pt lethargic and with minimal eye opening even when being mobilized or assessing response to noxious stimuli.  Pt with draws to pain on R, but delayed and sluggish on L LE, but absent on L UE.  Pt with no response to visual threat and no changes in vitals with positioning change.  Pt brought to sitting EOB without an increase in arousal and no righting reactions.  Pt with spontaneous chewing movements while sitting EOB, however no oral motor movements in response to stimuli.  Will trial PT for participation and eliciting responses to stimuli.  Feel pt will need Prevalon boots (RN aware) and feel pt may benefit from Palliative involvement.  Will continue to follow.      Follow Up Recommendations SNF    Equipment Recommendations  None recommended by PT    Recommendations for Other Services       Precautions / Restrictions Precautions Precautions: Fall Precaution Comments: pt with Ventric that needs to be clamped by RN prior to treat.  Trach Collar. Restrictions Weight Bearing Restrictions: No      Mobility  Bed Mobility Overal bed mobility: Needs Assistance Bed Mobility: Supine to Sit;Sit to Supine     Supine to sit: Total assist;+2 for physical assistance Sit to supine: Total assist;+2 for physical assistance   General bed mobility comments: pt does not participate in be mobility.    Transfers                    Ambulation/Gait                Stairs            Wheelchair Mobility    Modified Rankin (Stroke Patients Only) Modified Rankin (Stroke Patients Only) Pre-Morbid Rankin Score: No  symptoms Modified Rankin: Severe disability     Balance Overall balance assessment: Needs assistance Sitting-balance support: Feet supported Sitting balance-Leahy Scale: Zero Sitting balance - Comments: No righting reactions or participation in maintaining balance or any effort to hold head upright.                                       Pertinent Vitals/Pain Pain Assessment: Faces Faces Pain Scale: No hurt    Home Living Family/patient expects to be discharged to:: Unsure                      Prior Function Level of Independence: Independent               Hand Dominance        Extremity/Trunk Assessment   Upper Extremity Assessment Upper Extremity Assessment: Defer to OT evaluation    Lower Extremity Assessment Lower Extremity Assessment: RLE deficits/detail;LLE deficits/detail RLE Deficits / Details: Good withdraw to pain sensation, otherwise no active movement noted.  Positive mild clonus and decreased dorsiflexion ROM. RLE Coordination: decreased fine motor;decreased gross motor LLE Deficits / Details: Sluggish withdraw to pain and delayed.  No active movement noted.  pt with positive clonus and decrease dorsiflexion ROM.   LLE Coordination: decreased fine motor;decreased  gross motor       Communication   Communication: Tracheostomy (Decreased arousal)  Cognition Arousal/Alertness: Lethargic Behavior During Therapy: Flat affect Overall Cognitive Status: Difficult to assess                 General Comments: pt difficult to arouse.  pt does open eyes minimally in response to bed mobility, but doesn't blink to threat with either eye.      General Comments      Exercises     Assessment/Plan    PT Assessment Patient needs continued PT services  PT Problem List Decreased strength;Decreased activity tolerance;Decreased balance;Decreased mobility;Decreased coordination;Decreased cognition;Decreased knowledge of use of  DME;Decreased safety awareness;Decreased knowledge of precautions;Cardiopulmonary status limiting activity;Impaired sensation          PT Treatment Interventions DME instruction;Functional mobility training;Therapeutic activities;Therapeutic exercise;Balance training;Neuromuscular re-education;Cognitive remediation;Patient/family education    PT Goals (Current goals can be found in the Care Plan section)  Acute Rehab PT Goals Patient Stated Goal: pt unable to state. PT Goal Formulation: Patient unable to participate in goal setting Time For Goal Achievement: 11/02/16 Potential to Achieve Goals: Fair    Frequency Min 3X/week   Barriers to discharge        Co-evaluation               End of Session Equipment Utilized During Treatment: Oxygen (Trach collar) Activity Tolerance: Patient limited by lethargy Patient left: in bed;with call bell/phone within reach Nurse Communication: Mobility status;Need for lift equipment         Time: [id number] PT Time Calculation (min) (ACUTE ONLY): 27 min   Charges:   PT Evaluation $PT Eval Moderate Complexity: 1 Procedure PT Treatments $Therapeutic Activity: 8-22 mins   PT G CodesSunny Schlein:        Mackensi Mahadeo F Zeek Rostron, PT  [phone] 10/19/2016, 12:14 PM

## 2016-10-19 NOTE — Progress Notes (Signed)
OT had seen patient and worked with her. They were able to have patient follow commands by looking in their direction. On reassessment patient was able to squeeze my hand on command and show me two fingers. Zellie Jenning, [place] ScrapeSarah E, RN

## 2016-10-19 NOTE — Evaluation (Signed)
Occupational Therapy Evaluation Patient Details Name: Anne Olsen MRN: [id number] DOB: [birth year] Today's Date: 10/19/2016    History of Present Illness pt presents with Intraparenchymal Hemorrhage in R Thalamus.  She developed hydrocephalus and underwent ventriculostomy 10/05/16.  Repeat MRI 10/19/16 showed Intra-axial hematoma tracking from the right thalamus to the   Clinical Impression   Pt admitted with above. She demonstrates the below listed deficits and will benefit from continued OT to maximize safety and independence with BADLs.   After max visual, auditory, and sensory stimuli, pt followed one step commands x 4 to turn her head to left; and "look at me" with her Lt eye x 3.   Nsg student present, and RN notified.  She requires total A for all ADLs and total for all aspects of mobility.   Will follow acutely.  Disposition recommendations will be dependent upon pt progress.        Follow Up Recommendations  Other (comment) (dependent on progress )    Equipment Recommendations  Other (comment) (TBD)    Recommendations for Other Services       Precautions / Restrictions Precautions Precautions: Fall Precaution Comments: pt with Ventric that needs to be clamped by RN prior to treat.  Trach Collar. Restrictions Weight Bearing Restrictions: No      Mobility Bed Mobility Overal bed mobility: Needs Assistance Bed Mobility: Supine to Sit;Sit to Supine     Supine to sit: HOB elevated;Total assist Sit to supine: Total assist;HOB elevated   General bed mobility comments: Pt moved to EOB with total A for ~ 4 mins.  Pt briefly lifted head spontaneously x 1   Transfers                 General transfer comment: unable     Balance Overall balance assessment: Needs assistance Sitting-balance support: Feet unsupported Sitting balance-Leahy Scale: Zero Sitting balance - Comments: total assist                                     ADL Overall ADL's  : Needs assistance/impaired                                       General ADL Comments: Pt requires total A for all aspects of ADLs.  She is unable to assist      Vision Additional Comments: Pt initially with eyes closed, and did not open eyes to commands.  Eyelids opened with occasional downward eye movement noted Lt eye.  Eyes held open with various stimuli presented including light.  Pt made no attempt to track.  However,  afterwards bil. eyes remained partially opened.  At end of session  pt purposefully looked to Lt with Lt eye    Perception Perception Comments: unable to assess    Praxis Praxis Praxis-Other Comments: Unable to assess     Pertinent Vitals/Pain Pain Assessment: Faces Faces Pain Scale: No hurt     Hand Dominance  (unknown )   Extremity/Trunk Assessment Upper Extremity Assessment Upper Extremity Assessment: RUE deficits/detail;LUE deficits/detail RUE Deficits / Details: Pt spontaneously moving Rt UE elbow distally  LUE Deficits / Details: PROM WFL, no active ROM noted.  Extensor tone noted in response to painful stimuli  LUE Coordination: decreased fine motor;decreased gross motor   Lower Extremity Assessment Lower Extremity Assessment: Defer  to PT evaluation RLE Deficits / Details: Good withdraw to pain sensation, otherwise no active movement noted.  Positive mild clonus and decreased dorsiflexion ROM. RLE Coordination: decreased fine motor;decreased gross motor LLE Deficits / Details: Sluggish withdraw to pain and delayed.  No active movement noted.  pt with positive clonus and decrease dorsiflexion ROM.   LLE Coordination: decreased fine motor;decreased gross motor   Cervical / Trunk Assessment Cervical / Trunk Assessment: Other exceptions Cervical / Trunk Exceptions: minimal spontaneous head/neck movement    Communication Communication Communication: Tracheostomy   Cognition Arousal/Alertness: Lethargic Behavior During Therapy: Flat  affect Overall Cognitive Status: Impaired/Different from baseline Area of Impairment: Attention;Following commands   Current Attention Level: Focused (brief period)   Following Commands: Follows one step commands inconsistently       General Comments: max stimuli provided to increase arousal.  At end of session, pt with eyes partially opened.   She was noted to move head x 2 spontaneously.  After command to nod her head was given, she lowered chin to chest x 2, but difficult to determine if she was responding to the command.  With tactile cue to Lt check and verbal command, pt turned her head to Lt x 1.   She then proceeded to look to therapist on Lt x 3 with eyes only.      General Comments       Exercises       Shoulder Instructions      Home Living Family/patient expects to be discharged to:: Unsure Living Arrangements: Spouse/significant other                               Additional Comments: No family available at time of eval       Prior Functioning/Environment Level of Independence: Independent        Comments: Per chart, pt was fully independent and was studying to take CPA exam         OT Problem List: Decreased strength;Decreased range of motion;Decreased activity tolerance;Impaired balance (sitting and/or standing);Impaired vision/perception;Decreased coordination;Decreased cognition;Decreased safety awareness;Decreased knowledge of use of DME or AE;Impaired sensation;Impaired tone;Impaired UE functional use   OT Treatment/Interventions: Self-care/ADL training;Neuromuscular education;DME and/or AE instruction;Therapeutic activities;Splinting;Manual therapy;Cognitive remediation/compensation;Visual/perceptual remediation/compensation;Patient/family education;Balance training    OT Goals(Current goals can be found in the care plan section) Acute Rehab OT Goals Patient Stated Goal: pt unable to state. OT Goal Formulation: Patient unable to participate  in goal setting Time For Goal Achievement: 11/02/16 Potential to Achieve Goals: Fair ADL Goals Additional ADL Goal #1: Pt will follow one step motor commands at least 25 % of time Additional ADL Goal #2: Pt will sit EOB wtih max A in prep for ADLs  Additional ADL Goal #3: Family will be independent with PROM bil. UEs   OT Frequency: Min 2X/week   Barriers to D/C: Decreased caregiver support          Co-evaluation              End of Session Nurse Communication: Other (comment) (response to eval )  Activity Tolerance: Patient limited by lethargy Patient left: in bed;with call bell/phone within reach;with nursing/sitter in room   Time: 1239-1309 OT Time Calculation (min): 30 min Charges:  OT General Charges $OT Visit: 1 Procedure OT Evaluation $OT Eval High Complexity: 1 Procedure OT Treatments $Therapeutic Activity: 8-22 mins G-Codes:    Anne Olsen 10/19/2016, 2:46 PM

## 2016-10-19 NOTE — Progress Notes (Signed)
Nutrition Follow-up  INTERVENTION:   Continue:  Vital AF 1.2 @ 60 ml/hr (1440 ml/day) Provides: 1728 kcal, 108 grams protein, and 1167 ml H2O.   NUTRITION DIAGNOSIS:   Inadequate oral intake related to inability to eat as evidenced by NPO status. Ongoing.   GOAL:   Patient will meet greater than or equal to 90% of their needs Met.   MONITOR:   TF tolerance, Vent status, I & O's  ASSESSMENT:   Pt with PMH of HTN admitted with R intraparenchymal brain hemorrhage.   1/31 trach/PEG Pt discussed during ICU rounds and with RN.  Had EVD - possible shunt. Per neurosurgery poor prognosis for recovery K+ 5.4 Weight stable On miralax and senokot  Diet Order:  Diet NPO time specified  Skin:  Reviewed, no issues  Last BM:  275 ml 2/8 via rectal pouch   Height:   Ht Readings from Last 1 Encounters:  10/04/16 '5\' 1"'$  (1.549 m)    Weight:   Wt Readings from Last 1 Encounters:  10/19/16 154 lb 8.7 oz (70.1 kg)    Ideal Body Weight:  47.7 kg  BMI:  Body mass index is 29.2 kg/m.  Estimated Nutritional Needs:   Kcal:  1700-1900  Protein:  90-100 grams  Fluid:  > 1.7 L/day  EDUCATION NEEDS:   No education needs identified at this time  [place], [place], [place] Pager [phone] After Hours Pager

## 2016-10-20 DIAGNOSIS — Z930 Tracheostomy status: Secondary | ICD-10-CM

## 2016-10-20 LAB — BASIC METABOLIC PANEL
Anion gap: 8 (ref 5–15)
BUN: 23 mg/dL — ABNORMAL HIGH (ref 6–20)
CALCIUM: 8.9 mg/dL (ref 8.9–10.3)
CO2: 24 mmol/L (ref 22–32)
CREATININE: 0.86 mg/dL (ref 0.44–1.00)
Chloride: 104 mmol/L (ref 101–111)
GFR calc Af Amer: >60 mL/min (ref >60)
GFR calc non Af Amer: >60 mL/min (ref >60)
GLUCOSE: 123 mg/dL — AB (ref 65–99)
Potassium: 5 mmol/L (ref 3.5–5.1)
Sodium: 136 mmol/L (ref 135–145)

## 2016-10-20 LAB — GLUCOSE, CAPILLARY
GLUCOSE-CAPILLARY: 119 mg/dL — AB (ref 65–99)
Glucose-Capillary: 121 mg/dL — ABNORMAL HIGH (ref 65–99)
Glucose-Capillary: 121 mg/dL — ABNORMAL HIGH (ref 65–99)
Glucose-Capillary: 124 mg/dL — ABNORMAL HIGH (ref 65–99)
Glucose-Capillary: 123 mg/dL — ABNORMAL HIGH (ref 65–99)
Glucose-Capillary: 96 mg/dL (ref 65–99)

## 2016-10-20 LAB — URINE CULTURE

## 2016-10-20 LAB — CBC
HCT: 41.2 % (ref 36.0–46.0)
Hemoglobin: 13 g/dL (ref 12.0–15.0)
MCH: 29.3 pg (ref 26.0–34.0)
MCHC: 31.6 g/dL (ref 30.0–36.0)
MCV: 93 fL (ref 78.0–100.0)
PLATELETS: 491 10*3/uL — AB (ref 150–400)
RBC: 4.43 MIL/uL (ref 3.87–5.11)
RDW: 13 % (ref 11.5–15.5)
WBC: 13.1 10*3/uL — ABNORMAL HIGH (ref 4.0–10.5)

## 2016-10-20 MED ORDER — PANTOPRAZOLE SODIUM 40 MG PO PACK
40.0000 mg | PACK | Freq: Every day | ORAL | Status: DC
  Administered 2016-10-20 – 2016-11-15 (×24): 40 mg
  Filled 2016-10-20 (×24): qty 20

## 2016-10-20 NOTE — Progress Notes (Signed)
PULMONARY / CRITICAL CARE MEDICINE   Name: Anne JerichoViviana Rauf MRN: [id number] DOB: 1975-12-09     ADMISSION DATE:  10/04/2016  REFERRING MD:  Benjiman CoreNathan Pickering, M.D. / EDP  CHIEF COMPLAINT:  Acute Intracranial Hemorrhage  BRIEF SUMMARY:  41 y.o. female PMH of HTN who [facility] for headache, weakness, and AMS. CT head revealed an acute intraparenchymal hemorrhage in the right thalamus with a hematoma measuring 6.9 mL.   SUBJECTIVE:  Afebrile On ATC No obvious pain  VITAL SIGNS: BP 115/83   Pulse 76   Temp 98.9 F (37.2 C) (Axillary)   Resp (!) 21   Ht 5\' 1"  (1.549 m)   Wt 149 lb 7.6 oz (67.8 kg)   LMP  (LMP Unknown)   SpO2 95%   BMI 28.24 kg/m   HEMODYNAMICS:    VENTILATOR SETTINGS: FiO2 (%):  [28 %] 28 %  INTAKE / OUTPUT: I/O last 3 completed shifts: In: 2690 [I.V.:365; Other:75; NG/GT:2050; IV Piggyback:200] Out: 2919.5 [Urine:2570; Drains:349.5]  PHYSICAL EXAMINATION: General:  Ill appearing female in NAD HEENT: MM pink/moist, trach midline c/d/i  Neuro: no response to verbal stimuli, + corneal response, spontaneous movement of RUE noted, no follow commands CV: s1s2 rrr, no m/r/g PULM: even/non-labored, lungs bilaterally clear, on ATC GM:WNUUGI:soft, non-tender, bsx4 active  Extremities: warm/dry, trace generalized edema  Skin: no rashes or lesions    LABS: CBC Latest Ref Rng & Units 10/20/2016 10/19/2016 10/17/2016  WBC 4.0 - 10.5 K/uL 13.1(H) 13.6(H) 17.4(H)  Hemoglobin 12.0 - 15.0 g/dL 72.513.0 36.612.9 44.013.0  Hematocrit 36.0 - 46.0 % 41.2 41.0 40.4  Platelets 150 - 400 K/uL 491(H) 466(H) 473(H)   BMP Latest Ref Rng & Units 10/20/2016 10/19/2016 10/18/2016  Glucose 65 - 99 mg/dL 347(Q123(H) 99 259(D116(H)  BUN 6 - 20 mg/dL 63(O23(H) 75(I23(H) 43(P22(H)  Creatinine 0.44 - 1.00 mg/dL 2.950.86 1.880.84 4.160.93  Sodium 135 - 145 mmol/L 136 138 137  Potassium 3.5 - 5.1 mmol/L 5.0 5.4(H) 5.9(H)  Chloride 101 - 111 mmol/L 104 101 100(L)  CO2 22 - 32 mmol/L 24 25 27   Calcium 8.9 - 10.3 mg/dL  8.9 8.9 9.0     STUDIES:  CT HEAD CODE STROKE 1/25 >  Acute intraparenchymal hemorrhage in the right thalamus with hematoma measuring 6.9 mL. Intraventricular penetration. Small amount of subarachnoid penetration. CTA of the neck and head 1/25 > do not show any atherosclerotic narrowing. The vessels are somewhat tortuous suggesting hypertension. No sign of aneurysm or vascular malformation to explain the right thalamic hemorrhage. PORT CXR 1/26 > Personally reviewed by me.Minimal patchy right lower lobe opacity. No other parenchymal infiltrate appreciated. No pleural effusion. Endotracheal tube in good position. Enteric feeding tube coursing below diaphragm. CT HEAD 1/26 > decreased volume of right thalamic hemorrhage, intraventricular and subarachnoid hemorrhage, progressive hydrocephalus EEG 1/31>This EEG is consistent with a focal area of cerebral dysfunction in the right hemisphere superimposed on a generalized nonspecific cerebral dysfunction as can be seen with medication effect among other causes. ECHO > 55-60% CXR 2/8 > Bibasilar atelectasis, more on the left than on the right, small focus PNA in the left base cannot be excluded radiographically   MICROBIOLOGY: MRSA PCR 1/25:  Negative Blood 2/1 > Negative  U/A 2/7 > Positive Bacteria/Leuko Urine Culture 2/8 > klebsiella PNA >>  Blood 2/8 > Sputum 2/8 >   ANTIBIOTICS: Unasyn 1/26 > 2/1 Rocephin 2/8 >   SIGNIFICANT EVENTS: 1/25 - Admit with AMS, intubated  1/26 - IVC  drain placed  1/31 - PEG/Trach 2/04 - 24/7 ATC  LINES/TUBES: OETT 7.5 1/24 >1/31 Perc trach (DF) 1/31> PEG 1/31 >   ASSESSMENT / PLAN:  41 y.o. female status post acute intraparenchymal hemorrhage in the right thalamus with intraventricular penetration as well as a small amount of penetration into the subarachnoid space.  PULMONARY A: Acute Respiratory Failure secondary to inability to protect airway in setting of ICH  Tracheostomy Status - 1/31 per DF   P:   Trach collar as tolerated  NTS PRN  Trach care per protocol    CARDIOVASCULAR A:  HTN P:  Tele monitoring  PRN labetalol   RENAL A:   Hyperkalemia   P:   Trend BMP / UOP  Replace electrolytes as indicated  GASTROINTESTINAL A:   At risk protein calorie malnutrition  Dysphagia - s/p PEG 1/31 P:   Continue TF  PEG care per protocol   HEMATOLOGIC A:   Leukocytosis P:  Trend CBC  INFECTIOUS A:   Febrile - Increase in WBC, U/A positive  S/p 7 days Unasyn  Klebsiella UTI  P:   Trend WBC / fever curve  Await resp cultures from 2/8 Empiric rocephin    ENDOCRINE A:   Hypoglycemia  P:   SSI  NEUROLOGIC A:   Acute encephalopathy -Right thalamic and midbrain ICH s/p EVD - secondary to ?HTN Hydrocephalus Followed commands 2/9  P:   NSGY following, notes reflect likely poor prognosis and difficult ethical situation Continue IVC drain care >  Dr. Yetta Barre  has indicated that he does not want to place a VP shunt given the patient's overall poor prognosis and the low likelihood that shunting would change her overall outcome >> now following commands -    FAMILY  - Updates:  2/9   - Inter-disciplinary family meet  :  Done, 2/9    Cyril Mourning MD. FCCP. [facility] Pager [phone] If no response call [phone]    10/20/2016, 8:37 AM

## 2016-10-20 NOTE — Progress Notes (Signed)
Pt seen and examined. No issues overnight.   EXAM: Temp:  [98.4 F (36.9 C)-99.3 F (37.4 C)] 98.9 F (37.2 C) (02/10 0800) Pulse Rate:  [59-77] 76 (02/10 0814) Resp:  [8-31] 21 (02/10 0814) BP: (103-126)/(68-88) 115/83 (02/10 0814) SpO2:  [95 %-99 %] 95 % (02/10 0814) FiO2 (%):  [28 %] 28 % (02/10 0814) Weight:  [67.8 kg (149 lb 7.6 oz)] 67.8 kg (149 lb 7.6 oz) (02/10 0500) Intake/Output      02/09 0701 - 02/10 0700 02/10 0701 - 02/11 0700   I.V. (mL/kg) 270 (4) 10 (0.1)   Other 75    NG/GT 1440 60   IV Piggyback 150    Total Intake(mL/kg) 1935 (28.5) 70 (1)   Urine (mL/kg/hr) 1970 (1.2) 110 (0.8)   Drains 206.5 (0.1) 10 (0.1)   Stool 0 (0)    Total Output 2176.5 120   Net -241.5 -50        Stool Occurrence 1 x     Reportedly awakens some, follows commands on right This morning she is not arousable, withdraws on the right EVD patent Continue CSF diversion

## 2016-10-21 LAB — CBC
HCT: 41.7 % (ref 36.0–46.0)
Hemoglobin: 13.3 g/dL (ref 12.0–15.0)
MCH: 29.5 pg (ref 26.0–34.0)
MCHC: 31.9 g/dL (ref 30.0–36.0)
MCV: 92.5 fL (ref 78.0–100.0)
PLATELETS: 484 10*3/uL — AB (ref 150–400)
RBC: 4.51 MIL/uL (ref 3.87–5.11)
RDW: 13.1 % (ref 11.5–15.5)
WBC: 13.7 10*3/uL — ABNORMAL HIGH (ref 4.0–10.5)

## 2016-10-21 LAB — BASIC METABOLIC PANEL
Anion gap: 6 (ref 5–15)
BUN: 22 mg/dL — ABNORMAL HIGH (ref 6–20)
CO2: 25 mmol/L (ref 22–32)
CREATININE: 0.83 mg/dL (ref 0.44–1.00)
Calcium: 9.1 mg/dL (ref 8.9–10.3)
Chloride: 102 mmol/L (ref 101–111)
GFR calc Af Amer: >60 mL/min (ref >60)
Glucose, Bld: 97 mg/dL (ref 65–99)
Potassium: 5.9 mmol/L — ABNORMAL HIGH (ref 3.5–5.1)
SODIUM: 133 mmol/L — AB (ref 135–145)

## 2016-10-21 LAB — CULTURE, RESPIRATORY: CULTURE: NORMAL

## 2016-10-21 LAB — GLUCOSE, CAPILLARY
GLUCOSE-CAPILLARY: 111 mg/dL — AB (ref 65–99)
GLUCOSE-CAPILLARY: 112 mg/dL — AB (ref 65–99)
Glucose-Capillary: 105 mg/dL — ABNORMAL HIGH (ref 65–99)
Glucose-Capillary: 105 mg/dL — ABNORMAL HIGH (ref 65–99)
Glucose-Capillary: 125 mg/dL — ABNORMAL HIGH (ref 65–99)

## 2016-10-21 LAB — CULTURE, RESPIRATORY W GRAM STAIN

## 2016-10-21 MED ORDER — SODIUM POLYSTYRENE SULFONATE 15 GM/60ML PO SUSP
15.0000 g | Freq: Once | ORAL | Status: AC
  Administered 2016-10-21: 15 g
  Filled 2016-10-21: qty 60

## 2016-10-21 NOTE — Progress Notes (Signed)
PULMONARY / CRITICAL CARE MEDICINE   Name: Anne Olsen MRN: [id number] DOB: Sep 07, 1976     ADMISSION DATE:  10/04/2016  REFERRING MD:  Benjiman Core, M.D. / EDP  CHIEF COMPLAINT:  Acute Intracranial Hemorrhage  BRIEF SUMMARY:  41 y.o. female PMH of HTN who presented 1/25 to [facility] via EMS for headache, weakness, and AMS. CT head revealed an acute intraparenchymal hemorrhage in the right thalamus with a hematoma measuring 6.9 mL.   SUBJECTIVE:  Afebrile Remains On ATC No obvious pain or distress  VITAL SIGNS: BP 95/71   Pulse 68   Temp 98.8 F (37.1 C) (Oral)   Resp (!) 32   Ht 5\' 1"  (1.549 m)   Wt 151 lb 14.4 oz (68.9 kg)   LMP  (LMP Unknown)   SpO2 97%   BMI 28.70 kg/m   HEMODYNAMICS:    VENTILATOR SETTINGS: FiO2 (%):  [28 %] 28 %  INTAKE / OUTPUT: I/O last 3 completed shifts: In: 2805 [I.V.:360; Other:185; NG/GT:2160; IV Piggyback:100] Out: 2200 [Urine:1830; Drains:370]  PHYSICAL EXAMINATION: General:  Ill appearing female in NAD HEENT: MM pink/moist, trach midline c/d/i  Neuro: no response to verbal stimuli,  spontaneous movement of RUE noted, does not follow commands CV: s1s2 rrr, no m/r/g PULM: even/non-labored, lungs bilaterally clear, on ATC WU:JWJX, non-tender, bsx4 active  Extremities: warm/dry, trace generalized edema  Skin: no rashes or lesions    LABS: CBC Latest Ref Rng & Units 10/21/2016 10/20/2016 10/19/2016  WBC 4.0 - 10.5 K/uL 13.7(H) 13.1(H) 13.6(H)  Hemoglobin 12.0 - 15.0 g/dL 91.4 78.2 95.6  Hematocrit 36.0 - 46.0 % 41.7 41.2 41.0  Platelets 150 - 400 K/uL 484(H) 491(H) 466(H)   BMP Latest Ref Rng & Units 10/21/2016 10/20/2016 10/19/2016  Glucose 65 - 99 mg/dL 97 213(Y) 99  BUN 6 - 20 mg/dL 86(V) 78(I) 69(G)  Creatinine 0.44 - 1.00 mg/dL 2.95 2.84 1.32  Sodium 135 - 145 mmol/L 133(L) 136 138  Potassium 3.5 - 5.1 mmol/L 5.9(H) 5.0 5.4(H)  Chloride 101 - 111 mmol/L 102 104 101  CO2 22 - 32 mmol/L 25 24 25   Calcium 8.9 - 10.3 mg/dL 9.1  8.9 8.9     STUDIES:  CT HEAD CODE STROKE 1/25 >  Acute intraparenchymal hemorrhage in the right thalamus with hematoma measuring 6.9 mL. Intraventricular penetration. Small amount of subarachnoid penetration. CTA of the neck and head 1/25 > do not show any atherosclerotic narrowing. The vessels are somewhat tortuous suggesting hypertension. No sign of aneurysm or vascular malformation to explain the right thalamic hemorrhage. CT HEAD 1/26 > decreased volume of right thalamic hemorrhage, intraventricular and subarachnoid hemorrhage, progressive hydrocephalus EEG 1/31>This EEG is consistent with a focal area of cerebral dysfunction in the right hemisphere superimposed on a generalized nonspecific cerebral dysfunction as can be seen with medication effect among other causes. ECHO > 55-60%   MICROBIOLOGY: MRSA PCR 1/25:  Negative Blood 2/1 > Negative  U/A 2/7 > Positive Bacteria/Leuko Urine Culture 2/8 > klebsiella PNA >>  Blood 2/8 > Sputum 2/8 >   ANTIBIOTICS: Unasyn 1/26 > 2/1 Rocephin 2/8 >   SIGNIFICANT EVENTS: 1/25 - Admit with AMS, intubated  1/26 - IVC drain placed  1/31 - PEG/Trach 2/04 - 24/7 ATC  LINES/TUBES: OETT 7.5 1/24 >1/31 Perc trach (DF) 1/31> PEG 1/31 >   ASSESSMENT / PLAN:  41 y.o. female status post acute intraparenchymal hemorrhage in the right thalamus with intraventricular penetration as well as a small amount of penetration into the  subarachnoid space.  PULMONARY A: Acute Respiratory Failure secondary to inability to protect airway in setting of ICH  Tracheostomy Status - 1/31 per DF  P:   Trach collar as tolerated  NTS PRN  Trach care per protocol    CARDIOVASCULAR A:  HTN P:  Tele monitoring  PRN labetalol   RENAL A:   Hyperkalemia   P:   Trend BMP / UOP  Kayexalate x 1 Replace electrolytes as indicated  GASTROINTESTINAL A:   At risk protein calorie malnutrition  Dysphagia - s/p PEG 1/31 P:   Continue TF  PEG care per  protocol   HEMATOLOGIC A:   Leukocytosis P:  Trend CBC  INFECTIOUS A:   Mild leucocytosis S/p 7 days Unasyn  Klebsiella UTI  P:   Trend WBC / fever curve  Await resp cultures from 2/8 Empiric rocephin    ENDOCRINE A:   Hypoglycemia  P:   SSI  NEUROLOGIC A:   Acute encephalopathy -Right thalamic and midbrain ICH s/p EVD - secondary to ?HTN Hydrocephalus Followed commands 2/9  P:   NSGY following, notes reflect likely poor prognosis  Continue IVC drain care >  Dr. Yetta BarreJones  has indicated that he does not want to place a VP shunt given the patient's overall poor prognosis and the low likelihood that shunting would change her overall outcome >> now following commands - prognosis improved??    FAMILY  - Updates:  2/9   - Inter-disciplinary family meet  :  Done, 2/9    Cyril Mourningakesh Alva MD. FCCP. [place] [place] [facility] Pager [phone] If no response call [phone]    10/21/2016, 8:46 AM

## 2016-10-21 NOTE — Progress Notes (Signed)
Pt seen and examined. No issues overnight.  EXAM: Temp:  [98.6 F (37 C)-99.8 F (37.7 C)] 98.8 F (37.1 C) (02/11 0825) Pulse Rate:  [62-80] 68 (02/11 0836) Resp:  [20-34] 32 (02/11 0836) BP: (84-125)/(66-89) 95/71 (02/11 0836) SpO2:  [94 %-100 %] 97 % (02/11 0836) FiO2 (%):  [28 %] 28 % (02/11 0836) Weight:  [68.9 kg (151 lb 14.4 oz)] 68.9 kg (151 lb 14.4 oz) (02/11 0600) Intake/Output      02/10 0701 - 02/11 0700 02/11 0701 - 02/12 0700   I.V. (mL/kg) 240 (3.5) 10 (0.1)   Other 110    NG/GT 1440 60   IV Piggyback 50    Total Intake(mL/kg) 1840 (26.7) 70 (1)   Urine (mL/kg/hr) 1130 (0.7) 90 (0.6)   Drains 248 (0.1) 9 (0.1)   Stool 0 (0)    Total Output 1378 99   Net +462 -29        Stool Occurrence 2 x     Not arousable Some spontaneous movement RUE EVD patent ICP 15  Stable Continue current care

## 2016-10-22 ENCOUNTER — Other Ambulatory Visit: Payer: Self-pay | Admitting: Neurological Surgery

## 2016-10-22 LAB — BASIC METABOLIC PANEL
ANION GAP: 13 (ref 5–15)
Anion gap: 8 (ref 5–15)
BUN: 27 mg/dL — AB (ref 6–20)
BUN: 28 mg/dL — AB (ref 6–20)
CO2: 25 mmol/L (ref 22–32)
CO2: 27 mmol/L (ref 22–32)
CREATININE: 0.87 mg/dL (ref 0.44–1.00)
Calcium: 9.3 mg/dL (ref 8.9–10.3)
Calcium: 9.9 mg/dL (ref 8.9–10.3)
Chloride: 104 mmol/L (ref 101–111)
Chloride: 100 mmol/L — ABNORMAL LOW (ref 101–111)
Creatinine, Ser: 1.01 mg/dL — ABNORMAL HIGH (ref 0.44–1.00)
GFR calc Af Amer: >60 mL/min (ref >60)
GFR calc Af Amer: >60 mL/min (ref >60)
GFR calc non Af Amer: >60 mL/min (ref >60)
GLUCOSE: 121 mg/dL — AB (ref 65–99)
Glucose, Bld: 108 mg/dL — ABNORMAL HIGH (ref 65–99)
POTASSIUM: 6.2 mmol/L — AB (ref 3.5–5.1)
POTASSIUM: 5.9 mmol/L — AB (ref 3.5–5.1)
SODIUM: 137 mmol/L (ref 135–145)
Sodium: 140 mmol/L (ref 135–145)

## 2016-10-22 LAB — GLUCOSE, CAPILLARY
GLUCOSE-CAPILLARY: 115 mg/dL — AB (ref 65–99)
GLUCOSE-CAPILLARY: 109 mg/dL — AB (ref 65–99)
GLUCOSE-CAPILLARY: 132 mg/dL — AB (ref 65–99)
GLUCOSE-CAPILLARY: 132 mg/dL — AB (ref 65–99)
Glucose-Capillary: 99 mg/dL (ref 65–99)
Glucose-Capillary: 138 mg/dL — ABNORMAL HIGH (ref 65–99)

## 2016-10-22 LAB — PROTEIN AND GLUCOSE, CSF
Glucose, CSF: 70 mg/dL (ref 40–70)
Total  Protein, CSF: 8 mg/dL — ABNORMAL LOW (ref 15–45)

## 2016-10-22 LAB — CSF CELL COUNT WITH DIFFERENTIAL
RBC COUNT CSF: 200 /mm3 — AB
WBC, CSF: 0 /mm3 (ref 0–5)

## 2016-10-22 MED ORDER — INSULIN ASPART 100 UNIT/ML IV SOLN
10.0000 [IU] | Freq: Once | INTRAVENOUS | Status: AC
  Administered 2016-10-22: 10 [IU] via INTRAVENOUS

## 2016-10-22 MED ORDER — NEPRO/CARBSTEADY PO LIQD
1000.0000 mL | ORAL | Status: DC
  Administered 2016-10-22 – 2016-10-31 (×8): 1000 mL
  Filled 2016-10-22 (×16): qty 1000

## 2016-10-22 MED ORDER — PRO-STAT SUGAR FREE PO LIQD
30.0000 mL | Freq: Two times a day (BID) | ORAL | Status: DC
  Administered 2016-10-22 – 2016-10-31 (×18): 30 mL
  Filled 2016-10-22 (×19): qty 30

## 2016-10-22 MED ORDER — FUROSEMIDE 10 MG/ML IJ SOLN
40.0000 mg | Freq: Once | INTRAMUSCULAR | Status: AC
  Administered 2016-10-22: 40 mg via INTRAVENOUS
  Filled 2016-10-22: qty 4

## 2016-10-22 MED ORDER — SODIUM POLYSTYRENE SULFONATE 15 GM/60ML PO SUSP
45.0000 g | Freq: Once | ORAL | Status: AC
  Administered 2016-10-22: 45 g via ORAL
  Filled 2016-10-22: qty 180

## 2016-10-22 MED ORDER — SODIUM BICARBONATE 8.4 % IV SOLN
50.0000 meq | Freq: Once | INTRAVENOUS | Status: AC
  Administered 2016-10-22: 50 meq via INTRAVENOUS
  Filled 2016-10-22: qty 50

## 2016-10-22 MED ORDER — CALCIUM GLUCONATE 10 % IV SOLN
1.0000 g | Freq: Once | INTRAVENOUS | Status: AC
  Administered 2016-10-22: 1 g via INTRAVENOUS
  Filled 2016-10-22: qty 10

## 2016-10-22 MED ORDER — DEXTROSE 50 % IV SOLN
50.0000 mL | Freq: Once | INTRAVENOUS | Status: AC
  Administered 2016-10-22: 50 mL via INTRAVENOUS
  Filled 2016-10-22: qty 50

## 2016-10-22 MED ORDER — IPRATROPIUM-ALBUTEROL 0.5-2.5 (3) MG/3ML IN SOLN
3.0000 mL | Freq: Four times a day (QID) | RESPIRATORY_TRACT | Status: DC
  Administered 2016-10-22 – 2016-10-26 (×19): 3 mL via RESPIRATORY_TRACT
  Filled 2016-10-22 (×19): qty 3

## 2016-10-22 NOTE — Progress Notes (Signed)
Occupational Therapy Treatment Patient Details Name: Anne Olsen MRN: [id number] DOB: Dec 06, 1975 Today's Date: 10/22/2016    History of present illness pt presents with Intraparenchymal Hemorrhage in R Thalamus.  She developed hydrocephalus and underwent ventriculostomy 10/05/16.  Repeat MRI 10/19/16 showed Intra-axial hematoma.   OT comments  This 41 yo female admitted with above presents to acute OT with tolerating sitting EOB today (but not really assisting with body or head for balance) She did follow one command with her RLE today and duplicated it x1. She will continue to benefit from acute OT with follow up OT on CIR or at SNF depending on acute progress.   Follow Up Recommendations  Other (comment) (dependent on progress (rehab v. SNF))    Equipment Recommendations  Other (comment) (TBD next venue)       Precautions / Restrictions Precautions Precautions: Fall Precaution Comments: venticular drain; trach collar; PEG Restrictions Weight Bearing Restrictions: No       Mobility Bed Mobility Overal bed mobility: Needs Assistance Bed Mobility: Supine to Sit;Sit to Supine     Supine to sit: HOB elevated;Total assist Sit to supine: Total assist;HOB elevated   General bed mobility comments: Pt moved to EOB with total A for ~ 8 mins.    Transfers                 General transfer comment: unable     Balance Overall balance assessment: Needs assistance Sitting-balance support: Feet supported Sitting balance-Leahy Scale: Zero Sitting balance - Comments: total assist                            ADL                                         General ADL Comments: Pt requires total A for all aspects of ADLs.  She is unable to assist. Resisting with RUE attempt for hand over hand to wash face using RUE      Vision                 Additional Comments: Pt with eyes close throughout session, When raised eye lids for pt, no blink to  threat on left eye, but there was on right eye          Cognition   Behavior During Therapy: Flat affect Overall Cognitive Status: Difficult to assess Area of Impairment: Following commands        Following Commands: Follows one step commands inconsistently       General Comments: Max stimuli provided to increase arousal. Pt not opening eyes for any of session. Spontaneously moving RUE/LE. Able to follow 1 command in Spanish to kick out RLE and to stop kicking x2.                  Pertinent Vitals/ Pain       Pain Assessment: Faces Faces Pain Scale: No hurt Pain Location: grimaces with pain Pain Descriptors / Indicators: Grimacing Pain Intervention(s): Monitored during session;Repositioned         Frequency  Min 2X/week        Progress Toward Goals  OT Goals(current goals can now be found in the care plan section)  Progress towards OT goals: Progressing toward goals     Plan Discharge plan remains appropriate    Co-evaluation  PT/OT/SLP Co-Evaluation/Treatment: Yes Reason for Co-Treatment: Complexity of the patient's impairments (multi-system involvement);Necessary to address cognition/behavior during functional activity;For patient/therapist safety PT goals addressed during session: Balance;Mobility/safety with mobility OT goals addressed during session: Strengthening/ROM      End of Session Equipment Utilized During Treatment: Gait belt   Activity Tolerance Patient limited by lethargy   Patient Left in bed;with call bell/phone within reach;with bed alarm set;with family/visitor present   Nurse Communication  (pt did follow commands (verbal+tactile) for kicking RLE and stop kicking RLE)        Time: [id number] OT Time Calculation (min): 23 min  Charges: OT General Charges $OT Visit: 1 Procedure OT Treatments $Therapeutic Activity: 8-22 mins  Evette GeorgesLeonard, Warren Lindahl Eva [phone] 10/22/2016, 2:43 PM

## 2016-10-22 NOTE — Progress Notes (Signed)
Physical Therapy Treatment Patient Details Name: Anne JerichoViviana Olsen MRN: [id number] DOB: 1976-07-04 Today's Date: 10/22/2016    History of Present Illness pt presents with Intraparenchymal Hemorrhage in R Thalamus.  She developed hydrocephalus and underwent ventriculostomy 10/05/16.  Repeat MRI 10/19/16 showed Intra-axial hematoma.    PT Comments    Patient keeps eyes closed for all of session. Able to follow 1 command in Spanish to kick RLE and to stop kicking x2 but not following any other commands. Withdraws from pain all 4 extremities. Spontaneous movement noted RUE/LE. Provided max stimuli to help with arousal when sitting EOB. May try interpreter next session as pt followed command in Spanish. Will follow.  Follow Up Recommendations  SNF     Equipment Recommendations  None recommended by PT    Recommendations for Other Services       Precautions / Restrictions Precautions Precautions: Fall Precaution Comments: venticular drain; trach collar; PEG Restrictions Weight Bearing Restrictions: No    Mobility  Bed Mobility Overal bed mobility: Needs Assistance Bed Mobility: Supine to Sit;Sit to Supine     Supine to sit: HOB elevated;Total assist Sit to supine: Total assist;HOB elevated   General bed mobility comments: Pt moved to EOB with total A for ~ 8 mins.    Transfers                 General transfer comment: unable   Ambulation/Gait                 Stairs            Wheelchair Mobility    Modified Rankin (Stroke Patients Only) Modified Rankin (Stroke Patients Only) Pre-Morbid Rankin Score: No symptoms Modified Rankin: Severe disability     Balance Overall balance assessment: Needs assistance Sitting-balance support: Feet supported Sitting balance-Leahy Scale: Zero Sitting balance - Comments: total assist                             Cognition Arousal/Alertness: Lethargic Behavior During Therapy: Flat affect Overall  Cognitive Status: Difficult to assess Area of Impairment: Following commands       Following Commands: Follows one step commands inconsistently       General Comments: Max stimuli provided to increase arousal. Pt not opening eyes for any of session. Spontaneously moving RUE/LE. Able to follow 1 command in Spanish to kick out RLE and to stop kicking x2.     Exercises      General Comments General comments (skin integrity, edema, etc.): VSS. mother present during session.      Pertinent Vitals/Pain Pain Assessment: Faces Faces Pain Scale: No hurt Pain Location: grimaces with pain Pain Descriptors / Indicators: Grimacing Pain Intervention(s): Monitored during session;Repositioned    Home Living                      Prior Function            PT Goals (current goals can now be found in the care plan section) Progress towards PT goals: Not progressing toward goals - comment (secondary to decreased level of arousal)    Frequency    Min 2X/week      PT Plan Frequency needs to be updated    Co-evaluation PT/OT/SLP Co-Evaluation/Treatment: Yes Reason for Co-Treatment: For patient/therapist safety;Necessary to address cognition/behavior during functional activity;Complexity of the patient's impairments (multi-system involvement) PT goals addressed during session: Balance;Mobility/safety with mobility  End of Session Equipment Utilized During Treatment: Oxygen (trach collar; 6L on 28%)   Patient left: in bed;with call bell/phone within reach;with family/visitor present;with SCD's reapplied;with restraints reapplied     Time: 0926-0949 PT Time Calculation (min) (ACUTE ONLY): 23 min  Charges:  $Therapeutic Activity: 8-22 mins                    G Codes:      Tyjay Galindo A Faithlynn Deeley 10/22/2016, 11:37 AM  Mylo Red, PT, DPT [phone]

## 2016-10-22 NOTE — Progress Notes (Signed)
Pt is sleeping comfortably at this time.  No distress or complications noted.

## 2016-10-22 NOTE — Progress Notes (Addendum)
PULMONARY / CRITICAL CARE MEDICINE   Name: Anne JerichoViviana Olsen MRN: [id number] DOB: 10-Mar-1976     ADMISSION DATE:  10/04/2016  REFERRING MD:  Benjiman CoreNathan Pickering, M.D. / EDP  CHIEF COMPLAINT:  Acute Intracranial Hemorrhage  BRIEF SUMMARY:  41 y.o. female PMH of HTN who [facility] for headache, weakness, and AMS. CT head revealed an acute intraparenchymal hemorrhage in the right thalamus with a hematoma measuring 6.9 mL.   SUBJECTIVE:  No acute issues.  CSF sent for culture today. Just worked with PT this AM.  VITAL SIGNS: BP 98/77   Pulse 76   Temp 98.8 F (37.1 C) (Axillary)   Resp 18   Ht 5\' 1"  (1.549 m)   Wt 70 kg (154 lb 5.2 oz)   LMP  (LMP Unknown)   SpO2 96%   BMI 29.16 kg/m   HEMODYNAMICS:    VENTILATOR SETTINGS: FiO2 (%):  [28 %] 28 %  INTAKE / OUTPUT: I/O last 3 completed shifts: In: 2690 [I.V.:360; Other:120; NG/GT:2160; IV Piggyback:50] Out: 2348 [Urine:2015; Drains:333]  PHYSICAL EXAMINATION: General:  Ill appearing female in NAD. HEENT: MM pink/moist, trach midline c/d/i  Neuro: no response to verbal stimuli,  spontaneous movement of RUE noted, does not follow commands CV: s1s2 rrr, no m/r/g PULM: even/non-labored, lungs bilaterally clear, on ATC YQ:MVHQGI:soft, non-tender, bsx4 active  Extremities: warm/dry, trace generalized edema  Skin: no rashes or lesions    LABS: CBC Latest Ref Rng & Units 10/21/2016 10/20/2016 10/19/2016  WBC 4.0 - 10.5 K/uL 13.7(H) 13.1(H) 13.6(H)  Hemoglobin 12.0 - 15.0 g/dL 46.913.3 62.913.0 52.812.9  Hematocrit 36.0 - 46.0 % 41.7 41.2 41.0  Platelets 150 - 400 K/uL 484(H) 491(H) 466(H)   BMP Latest Ref Rng & Units 10/21/2016 10/20/2016 10/19/2016  Glucose 65 - 99 mg/dL 97 413(K123(H) 99  BUN 6 - 20 mg/dL 44(W22(H) 10(U23(H) 72(Z23(H)  Creatinine 0.44 - 1.00 mg/dL 3.660.83 4.400.86 3.470.84  Sodium 135 - 145 mmol/L 133(L) 136 138  Potassium 3.5 - 5.1 mmol/L 5.9(H) 5.0 5.4(H)  Chloride 101 - 111 mmol/L 102 104 101  CO2 22 - 32 mmol/L 25 24 25   Calcium  8.9 - 10.3 mg/dL 9.1 8.9 8.9     STUDIES:  CT HEAD CODE STROKE 1/25 >  Acute intraparenchymal hemorrhage in the right thalamus with hematoma measuring 6.9 mL. Intraventricular penetration. Small amount of subarachnoid penetration. CTA of the neck and head 1/25 > do not show any atherosclerotic narrowing. The vessels are somewhat tortuous suggesting hypertension. No sign of aneurysm or vascular malformation to explain the right thalamic hemorrhage. CT HEAD 1/26 > decreased volume of right thalamic hemorrhage, intraventricular and subarachnoid hemorrhage, progressive hydrocephalus EEG 1/31>This EEG is consistent with a focal area of cerebral dysfunction in the right hemisphere superimposed on a generalized nonspecific cerebral dysfunction as can be seen with medication effect among other causes. ECHO > 55-60%   MICROBIOLOGY: MRSA PCR 1/25:  Negative Blood 2/1 > Negative  U/A 2/7 > Positive Bacteria/Leuko Urine Culture 2/8 > klebsiella PNA >>  Blood 2/8 > Sputum 2/8 >   ANTIBIOTICS: Unasyn 1/26 > 2/1 Rocephin 2/8 >   SIGNIFICANT EVENTS: 1/25 - Admit with AMS, intubated  1/26 - IVC drain placed  1/31 - PEG/Trach 2/04 - 24/7 ATC  LINES/TUBES: OETT 7.5 1/24 >1/31 Perc trach (DF) 1/31> PEG 1/31 >   ASSESSMENT / PLAN:  41 y.o. female status post acute intraparenchymal hemorrhage in the right thalamus with intraventricular penetration as well as a small  amount of penetration into the subarachnoid space.  PULMONARY A: Acute Respiratory Failure secondary to inability to protect airway in setting of ICH  Tracheostomy Status - 1/31 per DF  P:   Trach collar as tolerated  NTS PRN  Trach care per protocol   CARDIOVASCULAR A:  HTN P:  Tele monitoring  PRN labetalol   RENAL A:   Hyperkalemia - received kayexalte. P:   Repeat BMP now. Trend BMP / UOP  Replace electrolytes as indicated  GASTROINTESTINAL A:   At risk protein calorie malnutrition  Dysphagia - s/p PEG  1/31 P:   Continue TF  PEG care per protocol   HEMATOLOGIC A:   VTE prophylaxis P:  Heparin / SCD's. CBC in AM.  INFECTIOUS A:   Klebsiella UTI - on ceftriaxone P:   Continue ceftriaxone. CSF sent for culture 2/12 - follow results.  ENDOCRINE A:   Hypoglycemia - resolved P:   No interventions required.  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 Yetta Barre  Had initially not felt VP shunt would help.  Now that pt following commands, is considering VP shunt placement this week.    FAMILY  - Updates:  2/9   - Inter-disciplinary family meet  :  Done, 2/9   CC time: 30 min.   Rutherford Guys, [place] [facility] [place] [facility] Pager: [phone]  or [phone] 10/22/2016, 9:56 AM   ATTENDING NOTE / ATTESTATION NOTE :   I have discussed the case with the resident/APP  Rutherford Guys PA  I agree with the resident/APP's  history, physical examination, assessment, and plans.    I have edited the above note and modified it according to our agreed history, physical examination, assessment and plan.   Briefly, 41 y.o. female PMH of HTN who presented 1/25 to [facility] via EMS for headache, weakness, and AMS. CT head revealed an acute intraparenchymal hemorrhage in the right thalamus with a hematoma measuring 6.9 mL. Patient has been trached and PEGed. Tolerating trache collar trials.   Vitals:  Vitals:   10/22/16 0800 10/22/16 0900 10/22/16 0902 10/22/16 1000  BP: 94/81 98/77 98/77  106/73  Pulse: 65 73  70  Resp: (!) 21 (!) 22  (!) 30  Temp: 98.8 F (37.1 C)     TempSrc: Axillary     SpO2: 97% 97%  97%  Weight:      Height:        Constitutional/General: well-nourished, well-developed, not in distress. Not responding. Does not follow commands. Tolerating TF  Body mass index is  29.16 kg/m. Wt Readings from Last 3 Encounters:  10/22/16 70 kg (154 lb 5.2 oz)    HEENT: PERLA, anicteric sclerae. (-) Oral thrush. Trache in place.   Neck: No masses. Midline trachea. No JVD, (-) LAD. (-) bruits appreciated.  Respiratory/Chest: Grossly normal chest. (-) deformity. (-) Accessory muscle use.  Symmetric expansion. Diminished BS on both lower lung zones. (-) wheezing, crackles. Rhonchi in BULF.  (-) egophony  Cardiovascular: Regular rate and  rhythm, heart sounds normal, no murmur or gallops,  Gr 1 peripheral edema  Gastrointestinal:  Normal bowel sounds. Soft, non-tender. No hepatosplenomegaly.  (-) masses. (+) PEG tube.   Musculoskeletal:  Normal muscle tone.   Extremities: Grossly normal. (-) clubbing, cyanosis.  Gr 1 edema  Skin: (-) rash,lesions seen.   Neurological/Psychiatric : sedated, intubated. CN grossly intact. (-) lateralizing signs.    CBC Recent Labs     10/20/16  0507  10/21/16  0332  WBC  13.1*  13.7*  HGB  13.0  13.3  HCT  41.2  41.7  PLT  491*  484*    Coag's No results for input(s): APTT, INR in the last 72 hours.  BMET Recent Labs     10/20/16  0507  10/21/16  0332  10/22/16  1014  NA  136  133*  137  K  5.0  5.9*  6.2*  CL  104  102  104  CO2  24  25  25   BUN  23*  22*  27*  CREATININE  0.86  0.83  0.87  GLUCOSE  123*  97  108*    Electrolytes Recent Labs     10/20/16  0507  10/21/16  0332  10/22/16  1014  CALCIUM  8.9  9.1  9.3    Sepsis Markers No results for input(s): PROCALCITON, O2SATVEN in the last 72 hours.  Invalid input(s): LACTICACIDVEN  ABG No results for input(s): PHART, PCO2ART, PO2ART in the last 72 hours.  Liver Enzymes No results for input(s): AST, ALT, ALKPHOS, BILITOT, ALBUMIN in the last 72 hours.  Cardiac Enzymes No results for input(s): TROPONINI, PROBNP in the last 72 hours.  Glucose Recent Labs     10/21/16  0813  10/21/16  1215  10/21/16  1924  10/21/16  2337   10/22/16  0314  10/22/16  0739  GLUCAP  105*  125*  111*  112*  99  115*    Imaging No results found.  Assessment/Plan :   S/P Tracheostomy for Acute respiratory failure related to ICH.  - keep o2 sats > 88% - ATC as tolerated.  - start Duonebs qid (she sounds rhonchorous today)   ICH - per Neurosurgery - CSF fluid sent for culture. If (-), pt hopefully will get a VPS.  Currently, she has an external ventriculostomy.    Malnutrition, moderate - cont TF   UTI, Klebsiella pna - plan for 7 days Rocephin   Hyperkalemia, likely spurious - recheck BMP now. Creatinine normal. Making adequate urine.    Family :Mother updated at bedside.    Pollie Meyer, MD 10/22/2016, 11:19 AM [place] [facility] Pager [phone] After 3 pm or if no answer, call [phone]

## 2016-10-22 NOTE — Progress Notes (Signed)
Trach care done per RRT.   Georgene Kopper L. Katrinka BlazingSmith, BS, RRT, RCP

## 2016-10-22 NOTE — Progress Notes (Signed)
Potassium still elevated at 5.9. Called in to e link.

## 2016-10-22 NOTE — Progress Notes (Signed)
   [facility]  Pt with persistent. Hyperkalemia. Rpt K 6.2 Not sure if it is still meds related or spurious.  No obvious meds.  will rx with kayexalate, lasix, ca gluconate, bicarb, insulin, D50.  Rpt K at 3 pm. She needs to move bowels with kayexalate, otherwise, will need more Kayexalate.   Pollie MeyerJ. Angelo A de Dios, MD 10/22/2016, 12:37 PM [place] [facility] Pager [phone] After 3 pm or if no answer, call [phone]

## 2016-10-22 NOTE — Progress Notes (Signed)
Patient ID: Anne JerichoViviana Tashiro, female   DOB: [birth year], 41 y.o.   MRN: [id number] No change in exam, occ opens eyes very briefly, moves r side some  CSF sent for studies  Will need shunting this week if CSF clear as it appears husband wants everything done despite her poor prognosis for any meaningful recovery

## 2016-10-22 NOTE — Progress Notes (Signed)
Nutrition Follow-up  INTERVENTION:   D/C Vital AF 1.2  Initiate Nepro with Carb Steady @ 35 ml/hr (840 ml/day) 30 ml Prostat BID Provides: 1712 kcal, 98 grams protein, and 610 ml free water.   This will reduce amount of potassium pt is receiving in her enteral nutrition therapy from 2430 ml K+ to 890 mg K+.    NUTRITION DIAGNOSIS:   Inadequate oral intake related to inability to eat as evidenced by NPO status. Ongoing.   GOAL:   Patient will meet greater than or equal to 90% of their needs Met.   MONITOR:   TF tolerance, Vent status, I & O's  REASON FOR ASSESSMENT:   Consult  (Decrease K+ in TF)  ASSESSMENT:   Pt with PMH of HTN admitted with R intraparenchymal brain hemorrhage.   Pt discussed during ICU rounds and with RN.  Plan for possible shunt later this week Medications reviewed and include: miralax, senokot-s,   Labs reviewed: K+ 6.2 - became elevated starting 2/6 per MD notes unsure of cause but would like RD to decrease K+ receiving in TF  Current TF: Vital AF 1.2 @ 60 ml/hr (1440 ml/day) Provides: 1728 kcal, 108 grams protein, and 1167 ml H2O. 2430 mg Potassium   Diet Order:  Diet NPO time specified  Skin:  Reviewed, no issues  Last BM:  2/11  Height:   Ht Readings from Last 1 Encounters:  10/04/16 '5\' 1"'$  (1.549 m)    Weight:   Wt Readings from Last 1 Encounters:  10/22/16 154 lb 5.2 oz (70 kg)    Ideal Body Weight:  47.7 kg  BMI:  Body mass index is 29.16 kg/m.  Estimated Nutritional Needs:   Kcal:  1700-1900  Protein:  90-100 grams  Fluid:  > 1.7 L/day  EDUCATION NEEDS:   No education needs identified at this time  [place], [place], [place] Pager [phone] After Hours Pager

## 2016-10-23 ENCOUNTER — Inpatient Hospital Stay (HOSPITAL_COMMUNITY): Payer: 59

## 2016-10-23 LAB — CBC
HCT: 43.9 % (ref 36.0–46.0)
HEMOGLOBIN: 14 g/dL (ref 12.0–15.0)
MCH: 29.4 pg (ref 26.0–34.0)
MCHC: 31.9 g/dL (ref 30.0–36.0)
MCV: 92 fL (ref 78.0–100.0)
Platelets: 439 10*3/uL — ABNORMAL HIGH (ref 150–400)
RBC: 4.77 MIL/uL (ref 3.87–5.11)
RDW: 13.2 % (ref 11.5–15.5)
WBC: 13.9 10*3/uL — AB (ref 4.0–10.5)

## 2016-10-23 LAB — BASIC METABOLIC PANEL
ANION GAP: 10 (ref 5–15)
ANION GAP: 12 (ref 5–15)
BUN: 33 mg/dL — ABNORMAL HIGH (ref 6–20)
BUN: 35 mg/dL — ABNORMAL HIGH (ref 6–20)
CALCIUM: 9.9 mg/dL (ref 8.9–10.3)
CO2: 28 mmol/L (ref 22–32)
CO2: 28 mmol/L (ref 22–32)
CREATININE: 1.11 mg/dL — AB (ref 0.44–1.00)
Calcium: 9.7 mg/dL (ref 8.9–10.3)
Chloride: 98 mmol/L — ABNORMAL LOW (ref 101–111)
Chloride: 98 mmol/L — ABNORMAL LOW (ref 101–111)
Creatinine, Ser: 1.02 mg/dL — ABNORMAL HIGH (ref 0.44–1.00)
GLUCOSE: 124 mg/dL — AB (ref 65–99)
Glucose, Bld: 137 mg/dL — ABNORMAL HIGH (ref 65–99)
POTASSIUM: 4.9 mmol/L (ref 3.5–5.1)
Potassium: 5.9 mmol/L — ABNORMAL HIGH (ref 3.5–5.1)
SODIUM: 136 mmol/L (ref 135–145)
Sodium: 138 mmol/L (ref 135–145)

## 2016-10-23 LAB — GLUCOSE, CAPILLARY
GLUCOSE-CAPILLARY: 120 mg/dL — AB (ref 65–99)
GLUCOSE-CAPILLARY: 142 mg/dL — AB (ref 65–99)
Glucose-Capillary: 129 mg/dL — ABNORMAL HIGH (ref 65–99)
Glucose-Capillary: 130 mg/dL — ABNORMAL HIGH (ref 65–99)
Glucose-Capillary: 114 mg/dL — ABNORMAL HIGH (ref 65–99)
Glucose-Capillary: 122 mg/dL — ABNORMAL HIGH (ref 65–99)

## 2016-10-23 LAB — CULTURE, BLOOD (ROUTINE X 2)
CULTURE: NO GROWTH
Culture: NO GROWTH

## 2016-10-23 LAB — HEPATIC FUNCTION PANEL
ALBUMIN: 2.8 g/dL — AB (ref 3.5–5.0)
ALT: 28 U/L (ref 14–54)
AST: 21 U/L (ref 15–41)
Alkaline Phosphatase: 53 U/L (ref 38–126)
Bilirubin, Direct: <0.1 mg/dL — ABNORMAL LOW (ref 0.1–0.5)
TOTAL PROTEIN: 6.6 g/dL (ref 6.5–8.1)
Total Bilirubin: 0.3 mg/dL (ref 0.3–1.2)

## 2016-10-23 LAB — PHOSPHORUS: PHOSPHORUS: 6.5 mg/dL — AB (ref 2.5–4.6)

## 2016-10-23 LAB — MAGNESIUM: Magnesium: 2.2 mg/dL (ref 1.7–2.4)

## 2016-10-23 NOTE — Progress Notes (Signed)
Trach care done per RRT 

## 2016-10-23 NOTE — Progress Notes (Addendum)
PULMONARY / CRITICAL CARE MEDICINE   Name: Anne Olsen MRN: [id number] DOB: 11-03-1975     ADMISSION DATE:  10/04/2016  REFERRING MD:  Benjiman Core, M.D. / EDP  CHIEF COMPLAINT:  Acute Intracranial Hemorrhage  BRIEF SUMMARY:  41 y.o. female PMH of HTN who presented 1/25 to [facility] via EMS for headache, weakness, and AMS. CT head revealed an acute intraparenchymal hemorrhage in the right thalamus with a hematoma measuring 6.9 mL.   SUBJECTIVE:  Hyperkalemia yesterday > treated.  Repeat labs this AM normal (K 4.9). NSGY planning to repeat head CT tomorrow 2/14 then place VP shunt Thurs 2/15.  VITAL SIGNS: BP 113/80   Pulse 71   Temp 99 F (37.2 C) (Axillary)   Resp (!) 22   Ht 5\' 1"  (1.549 m)   Wt 67.5 kg (148 lb 13 oz)   LMP  (LMP Unknown)   SpO2 97%   BMI 28.12 kg/m   HEMODYNAMICS:    VENTILATOR SETTINGS: FiO2 (%):  [28 %] 28 %  INTAKE / OUTPUT: I/O last 3 completed shifts: In: 2046.5 [I.V.:360; WU/JW:1191.4; IV Piggyback:50] Out: 2272 [Urine:1955; Drains:317]  PHYSICAL EXAMINATION: General:  Ill appearing female in NAD. HEENT: MM pink/moist, trach midline c/d/i  Neuro: no response to verbal stimuli,  spontaneous movement of RUE noted, does not follow commands CV: s1s2 rrr, no m/r/g PULM: even/non-labored, lungs bilaterally clear, on ATC NW:GNFA, non-tender, bsx4 active  Extremities: warm/dry, trace generalized edema  Skin: no rashes or lesions    LABS: CBC Latest Ref Rng & Units 10/23/2016 10/21/2016 10/20/2016  WBC 4.0 - 10.5 K/uL 13.9(H) 13.7(H) 13.1(H)  Hemoglobin 12.0 - 15.0 g/dL 21.3 08.6 57.8  Hematocrit 36.0 - 46.0 % 43.9 41.7 41.2  Platelets 150 - 400 K/uL 439(H) 484(H) 491(H)   BMP Latest Ref Rng & Units 10/23/2016 10/22/2016 10/22/2016  Glucose 65 - 99 mg/dL 469(G) 295(M) 841(L)  BUN 6 - 20 mg/dL 24(M) 01(U) 27(O)  Creatinine 0.44 - 1.00 mg/dL 5.36(U) 4.40(H) 4.74  Sodium 135 - 145 mmol/L 136 140 137  Potassium 3.5 - 5.1 mmol/L 4.9 5.9(H)  6.2(H)  Chloride 101 - 111 mmol/L 98(L) 100(L) 104  CO2 22 - 32 mmol/L 28 27 25   Calcium 8.9 - 10.3 mg/dL 9.7 9.9 9.3     STUDIES:  CT HEAD CODE STROKE 1/25 >  Acute intraparenchymal hemorrhage in the right thalamus with hematoma measuring 6.9 mL. Intraventricular penetration. Small amount of subarachnoid penetration. CTA of the neck and head 1/25 > do not show any atherosclerotic narrowing. The vessels are somewhat tortuous suggesting hypertension. No sign of aneurysm or vascular malformation to explain the right thalamic hemorrhage. CT HEAD 1/26 > decreased volume of right thalamic hemorrhage, intraventricular and subarachnoid hemorrhage, progressive hydrocephalus EEG 1/31>This EEG is consistent with a focal area of cerebral dysfunction in the right hemisphere superimposed on a generalized nonspecific cerebral dysfunction as can be seen with medication effect among other causes. ECHO > 55-60%   MICROBIOLOGY: MRSA PCR 1/25:  Negative Blood 2/1 > Negative  U/A 2/7 > Positive Bacteria/Leuko Urine Culture 2/8 > klebsiella PNA >>  Blood 2/8 > Sputum 2/8 >   ANTIBIOTICS: Unasyn 1/26 > 2/1 Rocephin 2/8 >   SIGNIFICANT EVENTS: 1/25 - Admit with AMS, intubated  1/26 - IVC drain placed  1/31 - PEG/Trach 2/04 - 24/7 ATC  LINES/TUBES: OETT 7.5 1/24 >1/31 Perc trach (DF) 1/31> PEG 1/31 >   ASSESSMENT / PLAN:  41 y.o. female status post acute intraparenchymal hemorrhage in  the right thalamus with intraventricular penetration as well as a small amount of penetration into the subarachnoid space.  PULMONARY A: Acute Respiratory Failure secondary to inability to protect airway in setting of ICH  Tracheostomy Status - 1/31 per DF  P:   Trach collar as tolerated  NTS PRN  Trach care per protocol   CARDIOVASCULAR A:  HTN P:  Tele monitoring  PRN labetalol   RENAL A:   Hyperkalemia - treated; now normal AM 2/13. P:   Repeat BMP this PM and in AM.  GASTROINTESTINAL A:   At  risk protein calorie malnutrition  Dysphagia - s/p PEG 1/31 P:   Continue TF  PEG care per protocol   HEMATOLOGIC A:   VTE prophylaxis P:  Heparin / SCD's. CBC in AM.  INFECTIOUS A:   Klebsiella UTI - on ceftriaxone P:   Continue ceftriaxone (stop date 2/14 for total 7 days). CSF sent for culture 2/12 - follow results.  ENDOCRINE A:   Hypoglycemia - resolved P:   No interventions required.  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 Continue IVC drain care >  Dr. Yetta BarreJones  Had initially not felt VP shunt would help.  Now that pt following commands, is considering VP shunt placement Thurs 2/15.   FAMILY  - Updates:  2/9   - Inter-disciplinary family meet  :  Done, 2/9   CC time: 30 min.   Rutherford Guysahul Desai, [place] - C [place] [facility] Pager: [phone]  or [phone] 10/23/2016, 8:15 AM   ATTENDING NOTE / ATTESTATION NOTE :   I have discussed the case with the resident/APP  Rutherford Guysahul Desai PA.   I agree with the resident/APP's  history, physical examination, assessment, and plans.    I have edited the above note and modified it according to our agreed history, physical examination, assessment and plan.   Briefly, 41 y.o. female PMH of HTN who [facility] for headache, weakness, and AMS. CT head revealed an acute intraparenchymal hemorrhage in the right thalamus with a hematoma measuring 6.9 mL. Patient has been trached and PEGed. Tolerating trache collar trials.  No issues last 24 hrs. Remains on ATC. HyperK improved. Had a large BM.   Vitals:  Vitals:   10/23/16 0800 10/23/16 0806 10/23/16 0807 10/23/16 0900  BP: 113/80  113/80 116/79  Pulse: 71   65  Resp: (!) 22   (!) 25  Temp: 98.4 F (36.9 C)     TempSrc: Axillary     SpO2: 98% 97%  97%  Weight:       Height:        Constitutional/General: well-nourished, well-developed, not in any distress. Does NOT follow commands. (-) purposeful mm.   Body mass index is 28.12 kg/m. Wt Readings from Last 3 Encounters:  10/23/16 67.5 kg (148 lb 13 oz)    HEENT: PERLA, anicteric sclerae. (-) Oral thrush.  Neck: No masses. Midline trachea. No JVD, (-) LAD. (-) bruits appreciated.  Respiratory/Chest: Grossly normal chest. (-) deformity. (-) Accessory muscle use.  Symmetric expansion. Diminished BS on both lower lung zones. (-) wheezing, crackles, rhonchi (-) egophony  Cardiovascular: Regular rate and  rhythm, heart sounds normal, no murmur or gallops,  Trace peripheral edema  Gastrointestinal:  Normal  bowel sounds. Soft, non-tender. No hepatosplenomegaly. PEG tube in place.  (-) masses.   Musculoskeletal:  Normal muscle tone.   Extremities: Grossly normal. (-) clubbing, cyanosis.  (-) edema  Skin: (-) rash,lesions seen.   Neurological/Psychiatric : Does not follow commands. (-) purposeful mm. (+) external CNS drain.     CBC Recent Labs     10/21/16  0332  10/23/16  0321  WBC  13.7*  13.9*  HGB  13.3  14.0  HCT  41.7  43.9  PLT  484*  439*    Coag's No results for input(s): APTT, INR in the last 72 hours.  BMET Recent Labs     10/22/16  1014  10/22/16  1517  10/23/16  0321  NA  137  140  136  K  6.2*  5.9*  4.9  CL  104  100*  98*  CO2  25  27  28   BUN  27*  28*  33*  CREATININE  0.87  1.01*  1.02*  GLUCOSE  108*  121*  124*    Electrolytes Recent Labs     10/22/16  1014  10/22/16  1517  10/23/16  0321  CALCIUM  9.3  9.9  9.7  MG   --    --   2.2  PHOS   --    --   6.5*    Sepsis Markers No results for input(s): PROCALCITON, O2SATVEN in the last 72 hours.  Invalid input(s): LACTICACIDVEN  ABG No results for input(s): PHART, PCO2ART, PO2ART in the last 72 hours.  Liver Enzymes Recent Labs     10/23/16  0321  AST  21  ALT  28  ALKPHOS  53   BILITOT  0.3  ALBUMIN  2.8*    Cardiac Enzymes No results for input(s): TROPONINI, PROBNP in the last 72 hours.  Glucose Recent Labs     10/22/16  1119  10/22/16  1533  10/22/16  1928  10/22/16  2304  10/23/16  0309  10/23/16  0736  GLUCAP  109*  132*  138*  132*  129*  120*    Imaging Dg Chest Port 1 View  Result Date: 10/23/2016 CLINICAL DATA:  CVA, respiratory failure, tracheostomy patient. EXAM: PORTABLE CHEST 1 VIEW COMPARISON:  Portable chest x-ray of October 18, 2016 FINDINGS: The lungs are mildly hypoinflated. Bibasilar atelectasis persists. The pulmonary interstitial markings have normalized. The heart is top-normal in size but stable. The tracheostomy appliance tip projects at the inferior margin of the clavicular heads. IMPRESSION: Improved appearance of the pulmonary interstitium consistent with decreased edema. Persistent bibasilar atelectasis. Electronically Signed   By: David  [place] M.D.   On: 10/23/2016 07:09    S/P Tracheostomy for Acute respiratory failure related to ICH.  - keep o2 sats > 88% - ATC as tolerated.  - cont Duonebs qid. Less rhonchi today.    ICH - per Neurosurgery - CSF fluid sent for culture on 2/12. (-) so far.  Currently, she has an external ventriculostomy. Plan for VPS on Thursday.    Malnutrition, moderate - cont TF   UTI, Klebsiella pna - plan for 7 days Rocephin   Hyperkalemia, related to TF.  - improved.    Family  Mother updated at bedside on 2/13.    Pollie Meyer, MD 10/23/2016, 9:54 AM [place] [facility] Pager [phone] After 3 pm or if no answer, call [phone]

## 2016-10-23 NOTE — Progress Notes (Signed)
Patient ID: Anne JerichoViviana Conti, female   DOB: Aug 10, 1976, 41 y.o.   MRN: [id number] CSF clear/ labs ok CSF cx P, on schedule for VP shunt on Thursday, head ct tomorrow

## 2016-10-23 NOTE — Progress Notes (Signed)
Visited w/ pt's mom in rm. Husband is working out of town for [place] couple of wks, in [place], pt's mom says. She is staying in their house w/ a friend from [place] who drives her to the hospital every day at 0730 and picks her up at 1800. Pt's mom is from [place] and believes she does not speak [place] well -- though I understand her and she me and she understands the words of the prayers (spoken slowly and carefully, stressing God's love and mercy).   Pt's mom says "Vivianita" is moving her right hand/arm some now, which she didn't before. Pt's mom is tired, but goes to cafeteria to get something to eat or to chapel to pray when she feels like crying,. She doesn't care how long it takes, as long as her daughter gets better. So she must be strong. She has many friends in [place] praying for ptr, all at the same time each day. Chaplain available for f/u.    10/23/16 1200  Clinical Encounter Type  Visited With Patient and family together  Visit Type Follow-up;Psychological support;Spiritual support;Social support;Critical Care  Referral From Chaplain  Spiritual Encounters  Spiritual Needs Prayer;Emotional  Stress Factors  Patient Stress Factors Health changes;Loss of control  Family Stress Factors Exhausted;Family relationships;Health changes;Loss of control   Ephraim Hamburgerynthia A Rino Hosea, [address]

## 2016-10-24 ENCOUNTER — Encounter (HOSPITAL_COMMUNITY): Payer: Self-pay | Admitting: Certified Registered Nurse Anesthetist

## 2016-10-24 ENCOUNTER — Inpatient Hospital Stay (HOSPITAL_COMMUNITY): Payer: 59

## 2016-10-24 LAB — CBC
HEMATOCRIT: 46.5 % — AB (ref 36.0–46.0)
HEMOGLOBIN: 14.7 g/dL (ref 12.0–15.0)
MCH: 29.8 pg (ref 26.0–34.0)
MCHC: 31.6 g/dL (ref 30.0–36.0)
MCV: 94.3 fL (ref 78.0–100.0)
PLATELETS: 426 10*3/uL — AB (ref 150–400)
RBC: 4.93 MIL/uL (ref 3.87–5.11)
RDW: 13.4 % (ref 11.5–15.5)
WBC: 14.2 10*3/uL — AB (ref 4.0–10.5)

## 2016-10-24 LAB — BASIC METABOLIC PANEL
ANION GAP: 11 (ref 5–15)
BUN: 39 mg/dL — ABNORMAL HIGH (ref 6–20)
CHLORIDE: 96 mmol/L — AB (ref 101–111)
CO2: 30 mmol/L (ref 22–32)
CREATININE: 1.06 mg/dL — AB (ref 0.44–1.00)
Calcium: 9.9 mg/dL (ref 8.9–10.3)
GFR calc non Af Amer: >60 mL/min (ref >60)
Glucose, Bld: 129 mg/dL — ABNORMAL HIGH (ref 65–99)
POTASSIUM: 5.6 mmol/L — AB (ref 3.5–5.1)
SODIUM: 137 mmol/L (ref 135–145)

## 2016-10-24 LAB — GLUCOSE, CAPILLARY
GLUCOSE-CAPILLARY: 121 mg/dL — AB (ref 65–99)
GLUCOSE-CAPILLARY: 138 mg/dL — AB (ref 65–99)
GLUCOSE-CAPILLARY: 131 mg/dL — AB (ref 65–99)
Glucose-Capillary: 125 mg/dL — ABNORMAL HIGH (ref 65–99)
Glucose-Capillary: 141 mg/dL — ABNORMAL HIGH (ref 65–99)

## 2016-10-24 LAB — PHOSPHORUS: Phosphorus: 5.9 mg/dL — ABNORMAL HIGH (ref 2.5–4.6)

## 2016-10-24 LAB — POTASSIUM: Potassium: 4.9 mmol/L (ref 3.5–5.1)

## 2016-10-24 LAB — MAGNESIUM: Magnesium: 2.4 mg/dL (ref 1.7–2.4)

## 2016-10-24 MED ORDER — SODIUM POLYSTYRENE SULFONATE 15 GM/60ML PO SUSP
15.0000 g | Freq: Once | ORAL | Status: AC
  Administered 2016-10-24: 15 g
  Filled 2016-10-24: qty 60

## 2016-10-24 MED ORDER — FUROSEMIDE 10 MG/ML IJ SOLN
40.0000 mg | Freq: Once | INTRAMUSCULAR | Status: AC
  Administered 2016-10-24: 40 mg via INTRAVENOUS
  Filled 2016-10-24: qty 4

## 2016-10-24 NOTE — Progress Notes (Signed)
Trach care done per RRT. New IC inserted and locked back into place. Site was cleansed, dried and new clean dressing applied. Pt tolerated it well. Pt was suctioned. Scant amount of secretions noted. Pt tolerated suctioning well.

## 2016-10-24 NOTE — Progress Notes (Signed)
OT Cancellation Note  Patient Details Name: Anne JerichoViviana Olsen MRN: [id number] DOB: [birth year]   Cancelled Treatment:    Reason Eval/Treat Not Completed: Patient at procedure or test/ unavailable.  Pt with another provider x 2 attempts.  Will try back.  Kawanda Drumheller Malcomonarpe, OTR/L [phone]   [name], [name] 10/24/2016, 4:45 PM

## 2016-10-24 NOTE — Progress Notes (Signed)
Nutrition Follow-up  INTERVENTION:   Continue:  Nepro with Carb Steady @ 35 ml/hr (840 ml/day) 30 ml Prostat BID Provides: 1712 kcal, 98 grams protein, and 610 ml free water.  Total potassium provided in TF regimen is 930 mg Potassium  NUTRITION DIAGNOSIS:   Inadequate oral intake related to inability to eat as evidenced by NPO status. Ongoing.   GOAL:   Patient will meet greater than or equal to 90% of their needs Met.   MONITOR:   TF tolerance, Vent status, I & O's  REASON FOR ASSESSMENT:   Consult  (Change to Lowest K+ TF)  ASSESSMENT:   Pt with PMH of HTN admitted with R intraparenchymal brain hemorrhage.   Pt discussed during ICU rounds and with RN.  Remains on trach collar Plan for possible shunt later this week Medications reviewed and include: miralax, senokot-s,   Decrease in K+ seen after TF adjustments, however has increased K+ 4.9 - 5.9 - 5.6   Diet Order:  Diet NPO time specified  Skin:  Reviewed, no issues  Last BM:  2/13  Height:   Ht Readings from Last 1 Encounters:  10/04/16 '5\' 1"'$  (1.549 m)    Weight:   Wt Readings from Last 1 Encounters:  10/24/16 147 lb 14.9 oz (67.1 kg)    Ideal Body Weight:  47.7 kg  BMI:  Body mass index is 27.95 kg/m.  Estimated Nutritional Needs:   Kcal:  1700-1900  Protein:  90-100 grams  Fluid:  > 1.7 L/day  EDUCATION NEEDS:   No education needs identified at this time  [place], [place], [place] Pager [phone] After Hours Pager

## 2016-10-24 NOTE — Progress Notes (Signed)
Patient ID: Anne JerichoViviana Stall, female   DOB: 1976-03-22, 41 y.o.   MRN: [id number] ventric patent at 20cm, ct head toady, no change in neuro, for VP shunt tomorrow if all agreeable.

## 2016-10-24 NOTE — Progress Notes (Signed)
CT scan discussed with Dr Yetta BarreJones. Drain lowered to 15cm H2O now, and will raise up to 20 at 0600, in preparation of surgery.

## 2016-10-24 NOTE — Progress Notes (Signed)
Pt is sleeping comfortably at this time.  No distress or complications noted.

## 2016-10-24 NOTE — Progress Notes (Signed)
PULMONARY / CRITICAL CARE MEDICINE   Name: Anne Olsen MRN: [id number] DOB: 1976/08/15    ADMISSION DATE:  10/04/2016 CONSULTATION DATE:  10/04/2016  REFERRING MD:  Benjiman CoreNathan Pickering, M.D. / EDP  CHIEF COMPLAINT:  Acute Intracranial Hemorrhage  Brief:   41 y.o. female PMH of HTN who presented 1/25 to [facility] via EMS for headache, weakness, and AMS. CT head revealed an acute intraparenchymal hemorrhage in the right thalamus with a hematoma measuring 6.9 mL  SUBJECTIVE:  EVD in place, no events overnight.   VITAL SIGNS: BP 115/90   Pulse 72   Temp 99.2 F (37.3 C) (Axillary)   Resp (!) 28   Ht 5\' 1"  (1.549 m)   Wt 67.1 kg (147 lb 14.9 oz)   LMP  (LMP Unknown)   SpO2 93%   BMI 27.95 kg/m   HEMODYNAMICS:    VENTILATOR SETTINGS: FiO2 (%):  [28 %] 28 %  INTAKE / OUTPUT: I/O last 3 completed shifts: In: 1880 [I.V.:360; Other:210; NG/GT:1260; IV Piggyback:50] Out: 1992 [Urine:1670; Drains:322]  PHYSICAL EXAMINATION: General: Adult female, lying in bed , no distress    Neuro: Withdrawals RLE/RUE, Pupils intact, Eyes closed  HEENT: EVD in place    Cardiovascular: RRR, NI S1/S2, no MRG  Lungs: Clear, diminished to bases, trach clean, dry, intact, on TC  Abdomen: non-tender, non-distended, active bowel sounds   Musculoskeletal: no acute  Skin:  Warm, dry, intact   LABS:  BMET  Recent Labs Lab 10/23/16 0321 10/23/16 1421 10/24/16 0339  NA 136 138 137  K 4.9 5.9* 5.6*  CL 98* 98* 96*  CO2 28 28 30   BUN 33* 35* 39*  CREATININE 1.02* 1.11* 1.06*  GLUCOSE 124* 137* 129*    Electrolytes  Recent Labs Lab 10/19/16 0243  10/23/16 0321 10/23/16 1421 10/24/16 0339  CALCIUM 8.9  < > 9.7 9.9 9.9  MG 2.1  --  2.2  --  2.4  PHOS 4.7*  --  6.5*  --  5.9*  < > = values in this interval not displayed.  CBC  Recent Labs Lab 10/21/16 0332 10/23/16 0321 10/24/16 0339  WBC 13.7* 13.9* 14.2*  HGB 13.3 14.0 14.7  HCT 41.7 43.9 46.5*  PLT 484* 439* 426*     Coag's No results for input(s): APTT, INR in the last 168 hours.  Sepsis Markers No results for input(s): LATICACIDVEN, PROCALCITON, O2SATVEN in the last 168 hours.  ABG No results for input(s): PHART, PCO2ART, PO2ART in the last 168 hours.  Liver Enzymes  Recent Labs Lab 10/23/16 0321  AST 21  ALT 28  ALKPHOS 53  BILITOT 0.3  ALBUMIN 2.8*    Cardiac Enzymes No results for input(s): TROPONINI, PROBNP in the last 168 hours.  Glucose  Recent Labs Lab 10/23/16 1607 10/23/16 1926 10/23/16 2252 10/24/16 0302 10/24/16 0745 10/24/16 1108  GLUCAP 142* 114* 122* 121* 138* 131*    Imaging Dg Chest Port 1 View  Result Date: 10/24/2016 CLINICAL DATA:  Respiratory failure EXAM: PORTABLE CHEST 1 VIEW COMPARISON:  10/23/2016 FINDINGS: Tracheostomy tube is again seen in satisfactory position. Cardiac shadow is within normal limits. The lungs are well aerated bilaterally. Minimal bibasilar atelectasis is seen. No focal confluent infiltrate or sizable effusion is noted. No bony abnormality is seen. IMPRESSION: Minimal basilar atelectasis. Electronically Signed   By: Alcide CleverMark  Lukens M.D.   On: 10/24/2016 07:53     STUDIES:  CT HEAD CODE STROKE 1/25 >  Acute intraparenchymal hemorrhage in the right thalamus with  hematoma measuring 6.9 mL. Intraventricular penetration. Small amount of subarachnoid penetration. CTA of the neck and head 1/25 > do not show any atherosclerotic narrowing. The vessels are somewhat tortuous suggesting hypertension. No sign of aneurysm or vascular malformation to explain the right thalamic hemorrhage. CT HEAD 1/26 > decreased volume of right thalamic hemorrhage, intraventricular and subarachnoid hemorrhage, progressive hydrocephalus EEG 1/31>This EEG is consistent with a focal area of cerebral dysfunction in the right hemisphere superimposed on a generalized nonspecific cerebral dysfunction as can be seen with medication effect among other causes. ECHO >  55-60%  CULTURES: MRSA PCR 1/25:  Negative Blood 2/1 > Negative  U/A 2/7 > Positive Bacteria/Leuko Urine Culture 2/8 > klebsiella PNA >>  Blood 2/8 > Sputum 2/8   ANTIBIOTICS: Unasyn 1/26 > 2/1 Rocephin 2/8 > 2/14  SIGNIFICANT EVENTS: 1/25 - Admit with AMS, intubated  1/26 - IVC drain placed  1/31 - PEG/Trach 2/04 - 24/7 ATC  LINES/TUBES: OETT 7.5 1/24 >1/31 Perc trach (DF) 1/31> PEG 1/31 >  DISCUSSION: 41 y.o. female status post acute intraparenchymal hemorrhage in the right thalamus with intraventricular penetration as well as a small amount of penetration into the subarachnoid space  ASSESSMENT / PLAN:  PULMONARY A: Acute Respiratory Failure in setting of ICH Tracheostomy 1/31 per DF  P:   Continue TC as tolerated  NTS PRN Trach care per protocol   CARDIOVASCULAR A:  HTN P:  Cardiac Monitoring  Labetalol PRN  RENAL A:   Hyperkalemia  P:   Trend BMP Kayexalate once Replace electrolytes as needed   GASTROINTESTINAL A:   S/P PEG 1/31  P:   PPI  TF per nutrition   HEMATOLOGIC A:   VTE prophylaxis P:  Heparin SQ SCD Trend CBC   INFECTIOUS A:   Klebsiella UTI - on ceftriaxone P:   Continue ceftriaxone (stop date 2/14 for total 7 days). CSF sent for culture 2/12 - follow results  ENDOCRINE A:   No issues  P:   Trend glucose   NEUROLOGIC A:   Acute Encephalopathy Right thalamic and midbrain ICH with Hydrocephalus s/p EVD on 1/16  P:   Per Neurosurgery  ?VP Shunt placement 2/15  RASS goal: 0 CT head pending   FAMILY  - Updates: No family at bedside   - Inter-disciplinary family meet or Palliative Care meeting due by:  Ongoing   CC Time: 31 mins  Jovita Kussmaul, AG-ACNP [facility]  Pgr: [phone]  PCCM Pgr: [phone]   ATTENDING NOTE / ATTESTATION NOTE :   I have discussed the case with the resident/APP  Jovita Kussmaul NP.    I agree with the resident/APP's  history, physical  examination, assessment, and plans.    I have edited the above note and modified it according to our agreed history, physical examination, assessment and plan.   Briefly, 41 y.o. female PMH of HTN who presented 1/25 to [facility] via EMS for headache, weakness, and AMS. CT head revealed an acute intraparenchymal hemorrhage in the right thalamus with a hematoma measuring 6.9 mL. Patient has been trached and PEGed. Tolerating trache collar trials. Hyperkalemia better with TF changes but elevated this am.   Vitals:  Vitals:   10/24/16 0900 10/24/16 1000 10/24/16 1148 10/24/16 1200  BP: (!) 123/93 115/90    Pulse: (!) 57 72    Resp: 20 (!) 28    Temp:    98.7 F (37.1 C)  TempSrc:    Axillary  SpO2: 96% 93%  96%   Weight:      Height:        Constitutional/General: well-nourished, well-developed, intubated, sedated, not in any distress  Body mass index is 27.95 kg/m. Wt Readings from Last 3 Encounters:  10/24/16 67.1 kg (147 lb 14.9 oz)    HEENT: PERLA, anicteric sclerae. (-) Oral thrush. Trache in place. NAD.   Neck: No masses. Midline trachea. No JVD, (-) LAD. (-) bruits appreciated. Trache in place.   Respiratory/Chest: Grossly normal chest. (-) deformity. (-) Accessory muscle use.  Symmetric expansion. Diminished BS on both lower lung zones. (-) wheezing, crackles Some rhonchi at upper lung zones (-) egophony  Cardiovascular: Regular rate and  rhythm, heart sounds normal, no murmur or gallops,  Trace peripheral edema  Gastrointestinal:  Normal bowel sounds. Soft, non-tender. No hepatosplenomegaly.  (-) masses.   Musculoskeletal:  Normal muscle tone.   Extremities: Grossly normal. (-) clubbing, cyanosis.  (-) edema  Skin: (-) rash,lesions seen.   Neurological/Psychiatric : sedated, intubated. CN grossly intact. (-) lateralizing signs.     CBC Recent Labs     10/23/16  0321  10/24/16  0339  WBC  13.9*  14.2*  HGB  14.0  14.7  HCT  43.9  46.5*  PLT  439*  426*     Coag's No results for input(s): APTT, INR in the last 72 hours.  BMET Recent Labs     10/23/16  0321  10/23/16  1421  10/24/16  0339  NA  136  138  137  K  4.9  5.9*  5.6*  CL  98*  98*  96*  CO2  28  28  30   BUN  33*  35*  39*  CREATININE  1.02*  1.11*  1.06*  GLUCOSE  124*  137*  129*    Electrolytes Recent Labs     10/23/16  0321  10/23/16  1421  10/24/16  0339  CALCIUM  9.7  9.9  9.9  MG  2.2   --   2.4  PHOS  6.5*   --   5.9*    Sepsis Markers No results for input(s): PROCALCITON, O2SATVEN in the last 72 hours.  Invalid input(s): LACTICACIDVEN  ABG No results for input(s): PHART, PCO2ART, PO2ART in the last 72 hours.  Liver Enzymes Recent Labs     10/23/16  0321  AST  21  ALT  28  ALKPHOS  53  BILITOT  0.3  ALBUMIN  2.8*    Cardiac Enzymes No results for input(s): TROPONINI, PROBNP in the last 72 hours.  Glucose Recent Labs     10/23/16  1607  10/23/16  1926  10/23/16  2252  10/24/16  0302  10/24/16  0745  10/24/16  1108  GLUCAP  142*  114*  122*  121*  138*  131*    Imaging Dg Chest Port 1 View  Result Date: 10/24/2016 CLINICAL DATA:  Respiratory failure EXAM: PORTABLE CHEST 1 VIEW COMPARISON:  10/23/2016 FINDINGS: Tracheostomy tube is again seen in satisfactory position. Cardiac shadow is within normal limits. The lungs are well aerated bilaterally. Minimal bibasilar atelectasis is seen. No focal confluent infiltrate or sizable effusion is noted. No bony abnormality is seen. IMPRESSION: Minimal basilar atelectasis. Electronically Signed   By: Alcide Clever M.D.   On: 10/24/2016 07:53   Dg Chest Port 1 View  Result Date: 10/23/2016 CLINICAL DATA:  CVA, respiratory failure, tracheostomy patient. EXAM: PORTABLE CHEST 1 VIEW COMPARISON:  Portable chest x-ray of  October 18, 2016 FINDINGS: The lungs are mildly hypoinflated. Bibasilar atelectasis persists. The pulmonary interstitial markings have normalized. The heart is top-normal in size  but stable. The tracheostomy appliance tip projects at the inferior margin of the clavicular heads. IMPRESSION: Improved appearance of the pulmonary interstitium consistent with decreased edema. Persistent bibasilar atelectasis. Electronically Signed   By: David  [place] M.D.   On: 10/23/2016 07:09   Assessment/Plan : S/P Tracheostomy for Acute respiratory failure related to ICH.  - keep o2 sats > 88% - ATC as tolerated.  - cont  Duonebs qid   ICH - per Neurosurgery - CSF fluid sent for culture on Monday and culture is (-). - plan for cranial CT scan this am and plan for VPS in am.    Malnutrition, moderate - cont TF   UTI, Klebsiella pna - plan for 7 days Rocephin > plan to d/c today.    Hyperkalemia - this improved with meds and changing tubefeeds. Rpt K was elevated on 2/13 and 2/14.  - Kayexalate and lasix today.  - repeating lytes this pm >> if still elevated, will need more meds.  - will ask dietary to give lowest K TF.    Family No family at bedside.     Pollie Meyer, MD 10/24/2016, 12:35 PM [facility] Pager [phone] After 3 pm or if no answer, call [phone]

## 2016-10-25 ENCOUNTER — Encounter (HOSPITAL_COMMUNITY)
Admission: EM | Disposition: A | Discharge status: Skilled Nursing Facility | Payer: Self-pay | Source: Home / Self Care | Attending: Internal Medicine

## 2016-10-25 DIAGNOSIS — Z430 Encounter for attention to tracheostomy: Secondary | ICD-10-CM

## 2016-10-25 LAB — MAGNESIUM: Magnesium: 2.6 mg/dL — ABNORMAL HIGH (ref 1.7–2.4)

## 2016-10-25 LAB — BASIC METABOLIC PANEL
ANION GAP: 14 (ref 5–15)
Anion gap: 12 (ref 5–15)
BUN: 51 mg/dL — ABNORMAL HIGH (ref 6–20)
BUN: 43 mg/dL — AB (ref 6–20)
CHLORIDE: 98 mmol/L — AB (ref 101–111)
CO2: 29 mmol/L (ref 22–32)
CO2: 29 mmol/L (ref 22–32)
CREATININE: 1.15 mg/dL — AB (ref 0.44–1.00)
Calcium: 10.2 mg/dL (ref 8.9–10.3)
Calcium: 10.2 mg/dL (ref 8.9–10.3)
Chloride: 98 mmol/L — ABNORMAL LOW (ref 101–111)
Creatinine, Ser: 1.15 mg/dL — ABNORMAL HIGH (ref 0.44–1.00)
GFR calc Af Amer: >60 mL/min (ref >60)
GFR calc non Af Amer: 60 mL/min — ABNORMAL LOW (ref >60)
GFR, EST NON AFRICAN AMERICAN: 60 mL/min — AB (ref >60)
Glucose, Bld: 102 mg/dL — ABNORMAL HIGH (ref 65–99)
Glucose, Bld: 143 mg/dL — ABNORMAL HIGH (ref 65–99)
POTASSIUM: 4.8 mmol/L (ref 3.5–5.1)
Potassium: 6.4 mmol/L (ref 3.5–5.1)
Sodium: 139 mmol/L (ref 135–145)
Sodium: 141 mmol/L (ref 135–145)

## 2016-10-25 LAB — CBC
HEMATOCRIT: 50.1 % — AB (ref 36.0–46.0)
HEMOGLOBIN: 15.9 g/dL — AB (ref 12.0–15.0)
MCH: 30.2 pg (ref 26.0–34.0)
MCHC: 31.7 g/dL (ref 30.0–36.0)
MCV: 95.1 fL (ref 78.0–100.0)
PLATELETS: 409 10*3/uL — AB (ref 150–400)
RBC: 5.27 MIL/uL — AB (ref 3.87–5.11)
RDW: 13.4 % (ref 11.5–15.5)
WBC: 14.3 10*3/uL — ABNORMAL HIGH (ref 4.0–10.5)

## 2016-10-25 LAB — GLUCOSE, CAPILLARY
GLUCOSE-CAPILLARY: 78 mg/dL (ref 65–99)
GLUCOSE-CAPILLARY: 122 mg/dL — AB (ref 65–99)
Glucose-Capillary: 111 mg/dL — ABNORMAL HIGH (ref 65–99)
Glucose-Capillary: 162 mg/dL — ABNORMAL HIGH (ref 65–99)
Glucose-Capillary: 115 mg/dL — ABNORMAL HIGH (ref 65–99)
Glucose-Capillary: 94 mg/dL (ref 65–99)

## 2016-10-25 LAB — CSF CULTURE

## 2016-10-25 LAB — CSF CULTURE W GRAM STAIN: Culture: NO GROWTH

## 2016-10-25 LAB — PHOSPHORUS: Phosphorus: 6 mg/dL — ABNORMAL HIGH (ref 2.5–4.6)

## 2016-10-25 SURGERY — SHUNT INSERTION VENTRICULAR-PERITONEAL
Anesthesia: General

## 2016-10-25 MED ORDER — SODIUM CHLORIDE 0.9 % IV SOLN
1.0000 g | Freq: Once | INTRAVENOUS | Status: AC
  Administered 2016-10-25: 1 g via INTRAVENOUS
  Filled 2016-10-25: qty 10

## 2016-10-25 MED ORDER — BACITRACIN ZINC 500 UNIT/GM EX OINT
TOPICAL_OINTMENT | CUTANEOUS | Status: AC
  Filled 2016-10-25: qty 28.35

## 2016-10-25 MED ORDER — PROPOFOL 10 MG/ML IV BOLUS
INTRAVENOUS | Status: AC
  Filled 2016-10-25: qty 20

## 2016-10-25 MED ORDER — FUROSEMIDE 10 MG/ML IJ SOLN
INTRAMUSCULAR | Status: AC
  Filled 2016-10-25: qty 2

## 2016-10-25 MED ORDER — DEXTROSE 50 % IV SOLN
50.0000 mL | Freq: Once | INTRAVENOUS | Status: AC
  Administered 2016-10-25: 50 mL via INTRAVENOUS
  Filled 2016-10-25: qty 50

## 2016-10-25 MED ORDER — LIDOCAINE-EPINEPHRINE (PF) 2 %-1:200000 IJ SOLN
INTRAMUSCULAR | Status: AC
  Filled 2016-10-25: qty 20

## 2016-10-25 MED ORDER — INSULIN ASPART 100 UNIT/ML ~~LOC~~ SOLN
10.0000 [IU] | Freq: Once | SUBCUTANEOUS | Status: AC
  Administered 2016-10-25: 10 [IU] via SUBCUTANEOUS

## 2016-10-25 MED ORDER — THROMBIN 5000 UNITS EX SOLR
CUTANEOUS | Status: AC
  Filled 2016-10-25: qty 15000

## 2016-10-25 MED ORDER — FUROSEMIDE 10 MG/ML IJ SOLN
40.0000 mg | Freq: Once | INTRAMUSCULAR | Status: AC
  Administered 2016-10-25: 40 mg via INTRAVENOUS
  Filled 2016-10-25: qty 4

## 2016-10-25 MED ORDER — FENTANYL CITRATE (PF) 100 MCG/2ML IJ SOLN
INTRAMUSCULAR | Status: AC
  Filled 2016-10-25: qty 2

## 2016-10-25 MED ORDER — SODIUM POLYSTYRENE SULFONATE 15 GM/60ML PO SUSP
30.0000 g | Freq: Once | ORAL | Status: AC
  Administered 2016-10-25: 30 g via RECTAL
  Filled 2016-10-25: qty 120

## 2016-10-25 NOTE — Progress Notes (Signed)
Pt is sleeping comfortably at this time no distress noted.  

## 2016-10-25 NOTE — Progress Notes (Signed)
eLink Physician-Brief Progress Note Patient Name: Anne JerichoViviana Olsen DOB: Feb 19, 1976 MRN: [id number]   Date of Service  10/25/2016  HPI/Events of Note  K 6.4   eICU Interventions  Insulin 10 D50 kayexelate Ca gluconate Repeat BMET 1000 today     Intervention Category Intermediate Interventions: Electrolyte abnormality - evaluation and management  Max FickleDouglas Daleyssa Loiselle 10/25/2016, 4:50 AM

## 2016-10-25 NOTE — Progress Notes (Signed)
Occupational Therapy Treatment Patient Details Name: Anne Olsen MRN: [id number] DOB: [birth year] Today's Date: 10/25/2016    History of present illness pt presents with Intraparenchymal Hemorrhage in R Thalamus.  She developed hydrocephalus and underwent ventriculostomy 10/05/16.  Repeat MRI 10/19/16 showed Intra-axial hematoma.   OT comments  Pt followed no commands today.  Appeared to purposefully resist eye opening.  She did blink to threat inconsistently today.   She requires total A for all ADLs and mobility.   Will continue to follow.   Follow Up Recommendations  Other (comment)    Equipment Recommendations  Other (comment)    Recommendations for Other Services      Precautions / Restrictions Precautions Precautions: Fall Precaution Comments: venticular drain; trach collar; PEG       Mobility Bed Mobility Overal bed mobility: Needs Assistance Bed Mobility: Rolling Rolling: Total assist            Transfers                 General transfer comment: unable     Balance                                   ADL                                         General ADL Comments: hand over hand assist to wash face.  Pt makes no attempt to assist       Vision                 Additional Comments: Pt keeps eyes closed.  When lids lifted, she demonstrates dysconjugate gaze.   She will inconsistently blink to threat.  Does not attempt to track.  Minimal Lt eye movement noted spontaneously with oscillopsia noted    Perception     Praxis      Cognition   Behavior During Therapy: Flat affect Overall Cognitive Status: Impaired/Different from baseline                  General Comments: Pt keeps eyes closed despite max cues.  She does not follow commands today despite max stimuli.  She will withdraw to noxious stimuli.       Extremity/Trunk Assessment               Exercises     Shoulder Instructions       General Comments      Pertinent Vitals/ Pain       Pain Assessment: Faces  Home Living                                          Prior Functioning/Environment              Frequency  Min 2X/week        Progress Toward Goals  OT Goals(current goals can now be found in the care plan section)  Progress towards OT goals: Not progressing toward goals - comment     Plan Discharge plan remains appropriate    Co-evaluation                 End of Session     Activity Tolerance Patient limited  by lethargy   Patient Left in bed;with call bell/phone within reach;with family/visitor present   Nurse Communication Other (comment) (results of treatment )        Time: 1100-1133 OT Time Calculation (min): 33 min  Charges: OT General Charges $OT Visit: 1 Procedure OT Treatments $Therapeutic Activity: 23-37 mins  Casin Federici M 10/25/2016, 12:21 PM

## 2016-10-25 NOTE — Progress Notes (Signed)
PULMONARY / CRITICAL CARE MEDICINE   Name: Anne Olsen MRN: [id number] DOB: 1976/04/28    ADMISSION DATE:  10/04/2016 CONSULTATION DATE:  10/04/2016  REFERRING MD:  Benjiman Core, M.D. / EDP  CHIEF COMPLAINT:  Acute Intracranial Hemorrhage  Brief:   41 y.o. female PMH of HTN who presented 1/25 to [facility] via EMS for headache, weakness, and AMS. CT head revealed an acute intraparenchymal hemorrhage in the right thalamus with a hematoma measuring 6.9 mL  SUBJECTIVE:  EVD in place, no events overnight.  Neurologically the same.  Still with hyperK > better with meds. Rpt K this am elevated. Already treated.   VITAL SIGNS: BP 124/79   Pulse (!) 59   Temp 98.4 F (36.9 C) (Axillary)   Resp (!) 23   Ht 5\' 1"  (1.549 m)   Wt 65.3 kg (143 lb 15.4 oz)   LMP  (LMP Unknown)   SpO2 99%   BMI 27.20 kg/m   HEMODYNAMICS:    VENTILATOR SETTINGS: FiO2 (%):  [28 %] 28 %  INTAKE / OUTPUT: I/O last 3 completed shifts: In: 1505 [I.V.:450; NG/GT:945; IV Piggyback:110] Out: 1863 [Urine:1590; Drains:273]  PHYSICAL EXAMINATION: General: Adult female, lying in bed , no distress    Neuro: Pupils intact, Eyes closed. (-) response to name calling.  Does not follow commands.  HEENT: EVD in place    Cardiovascular: RRR, NI S1/S2, no MRG  Lungs: Clear, diminished to bases, trach clean, dry, intact, on TC  Abdomen: non-tender, non-distended, active bowel sounds   Musculoskeletal: no acute  Skin:  Warm, dry, intact   LABS:  BMET  Recent Labs Lab 10/23/16 1421 10/24/16 0339 10/24/16 1630 10/25/16 0256  NA 138 137  --  139  K 5.9* 5.6* 4.9 6.4*  CL 98* 96*  --  98*  CO2 28 30  --  29  BUN 35* 39*  --  51*  CREATININE 1.11* 1.06*  --  1.15*  GLUCOSE 137* 129*  --  102*    Electrolytes  Recent Labs Lab 10/23/16 0321 10/23/16 1421 10/24/16 0339 10/25/16 0256  CALCIUM 9.7 9.9 9.9 10.2  MG 2.2  --  2.4 2.6*  PHOS 6.5*  --  5.9* 6.0*    CBC  Recent Labs Lab  10/23/16 0321 10/24/16 0339 10/25/16 0256  WBC 13.9* 14.2* 14.3*  HGB 14.0 14.7 15.9*  HCT 43.9 46.5* 50.1*  PLT 439* 426* 409*    Coag's No results for input(s): APTT, INR in the last 168 hours.  Sepsis Markers No results for input(s): LATICACIDVEN, PROCALCITON, O2SATVEN in the last 168 hours.  ABG No results for input(s): PHART, PCO2ART, PO2ART in the last 168 hours.  Liver Enzymes  Recent Labs Lab 10/23/16 0321  AST 21  ALT 28  ALKPHOS 53  BILITOT 0.3  ALBUMIN 2.8*    Cardiac Enzymes No results for input(s): TROPONINI, PROBNP in the last 168 hours.  Glucose  Recent Labs Lab 10/24/16 0745 10/24/16 1108 10/24/16 1925 10/24/16 2300 10/25/16 0305 10/25/16 0750  GLUCAP 138* 131* 125* 141* 111* 78    Imaging Ct Head Wo Contrast  Result Date: 10/24/2016 CLINICAL DATA:  41 y/o  F; hydrocephalus with shunt. EXAM: CT HEAD WITHOUT CONTRAST TECHNIQUE: Contiguous axial images were obtained from the base of the skull through the vertex without intravenous contrast. COMPARISON:  10/19/2016 MRI of the head.  10/14/2016 CT of the head. FINDINGS: Brain: Decreased attenuation of intra-axial hematoma centered in the right thalamus and midbrain and hemorrhage within  occipital horns of lateral ventricles. Hypoattenuation of the brain with swelling of the brainstem compatible with edema is grossly stable. Hydrocephalus has worsened with increased size of lateral and third ventricles, diffuse sulcal effacement, and a minimal increase in tonsillar herniation and crowding in the posterior fossa. Lucency along the tract of right frontal approach ventriculostomy catheter is stable. The catheter tip abuts the septum pellucidum and frontal horn of right lateral ventricle. No evidence for new large acute infarct or hemorrhage. Vascular: No hyperdense vessel or unexpected calcification. Skull: Normal. Negative for fracture or focal lesion. Sinuses/Orbits: No acute finding. Other: None.  IMPRESSION: 1. Worsening hydrocephalus from prior MRI and CT with diffuse sulcal effacement and possible slight increase in cerebellar tonsillar herniation. 2. Decreased attenuation of midbrain and thalamus hemorrhage and stable associated edema and swelling of the brainstem. 3. No evidence for new large acute infarct or hemorrhage. These results will be called to the ordering clinician or representative by the Radiologist Assistant, and communication documented in the PACS or zVision Dashboard. Electronically Signed   By: Mitzi HansenLance  Furusawa-Stratton M.D.   On: 10/24/2016 14:31     STUDIES:  CT HEAD CODE STROKE 1/25 >  Acute intraparenchymal hemorrhage in the right thalamus with hematoma measuring 6.9 mL. Intraventricular penetration. Small amount of subarachnoid penetration. CTA of the neck and head 1/25 > do not show any atherosclerotic narrowing. The vessels are somewhat tortuous suggesting hypertension. No sign of aneurysm or vascular malformation to explain the right thalamic hemorrhage. CT HEAD 1/26 > decreased volume of right thalamic hemorrhage, intraventricular and subarachnoid hemorrhage, progressive hydrocephalus EEG 1/31>This EEG is consistent with a focal area of cerebral dysfunction in the right hemisphere superimposed on a generalized nonspecific cerebral dysfunction as can be seen with medication effect among other causes. ECHO > 55-60%  CULTURES: MRSA PCR 1/25:  Negative Blood 2/1 > Negative  U/A 2/7 > Positive Bacteria/Leuko Urine Culture 2/8 > klebsiella PNA >>  Blood 2/8 > (-) Sputum 2/8 (-)  ANTIBIOTICS: Unasyn 1/26 > 2/1 Rocephin 2/8 > 2/14  SIGNIFICANT EVENTS: 1/25 - Admit with AMS, intubated  1/26 - IVC drain placed  1/31 - PEG/Trach 2/04 - 24/7 ATC  LINES/TUBES: OETT 7.5 1/24 >1/31 Perc trach (DF) 1/31> PEG 1/31 >  DISCUSSION: 41 y.o. female status post acute intraparenchymal hemorrhage in the right thalamus with intraventricular penetration as well as a  small amount of penetration into the subarachnoid space  ASSESSMENT / PLAN:  PULMONARY A: Acute Respiratory Failure in setting of ICH Tracheostomy 1/31 per DF  P:   Continue TC as tolerated  NTS PRN Trach care per protocol   CARDIOVASCULAR A:  HTN P:  Cardiac Monitoring  Labetalol PRN  RENAL A:   Hyperkalemia . Likely patient has decreased K urinary secretion vs hypoaldosterism P:   Likely will need lasix daily.  Her K this am was 6.4 >> got meds already.  Will give lasix and rpt K after.  Needs N K prior to surgery.    GASTROINTESTINAL A:   S/P PEG 1/31  P:   PPI  TF per nutrition, on low K TF.   HEMATOLOGIC A:   VTE prophylaxis P:  Heparin SQ SCD Trend CBC   INFECTIOUS A:   Klebsiella UTI - on ceftriaxone. Treated.  P:   Off antibiotics.  Observe fever trend.   ENDOCRINE A:   No issues  P:   Trend glucose   NEUROLOGIC A:   Acute Encephalopathy Right thalamic and midbrain ICH with  Hydrocephalus s/p EVD on 1/16. CT scan on 2/14 with worsening HCP.  P:   Per Neurosurgery  Plan for VP Shunt placement 2/15. K has to be within acceptable range for surgery.  RASS goal: 0    FAMILY  - Updates: family updated at bedside. Plan d/w mother.   - Inter-disciplinary family meet or Palliative Care meeting due by:  Ongoing    J. Alexis Frock, MD 10/25/2016, 10:04 AM [facility] Pager [phone] After 3 pm or if no answer, call [phone]

## 2016-10-25 NOTE — Progress Notes (Signed)
Interpreter Wyvonnia DuskyGraciela Namihira for WorleyWendy OT

## 2016-10-25 NOTE — Progress Notes (Signed)
Patient vomited. Dr. Yetta Barre notified. IVC Drain lowered to 10cm. Ripley Fraise D

## 2016-10-26 LAB — GLUCOSE, CAPILLARY
GLUCOSE-CAPILLARY: 79 mg/dL (ref 65–99)
GLUCOSE-CAPILLARY: 94 mg/dL (ref 65–99)
Glucose-Capillary: 82 mg/dL (ref 65–99)
Glucose-Capillary: 99 mg/dL (ref 65–99)
Glucose-Capillary: 75 mg/dL (ref 65–99)
Glucose-Capillary: 79 mg/dL (ref 65–99)

## 2016-10-26 LAB — BASIC METABOLIC PANEL
Anion gap: 10 (ref 5–15)
BUN: 49 mg/dL — AB (ref 6–20)
CALCIUM: 9 mg/dL (ref 8.9–10.3)
CHLORIDE: 107 mmol/L (ref 101–111)
CO2: 25 mmol/L (ref 22–32)
CREATININE: 1.06 mg/dL — AB (ref 0.44–1.00)
GFR calc non Af Amer: >60 mL/min (ref >60)
GLUCOSE: 95 mg/dL (ref 65–99)
Potassium: 4.3 mmol/L (ref 3.5–5.1)
Sodium: 142 mmol/L (ref 135–145)

## 2016-10-26 LAB — MAGNESIUM: Magnesium: 2.2 mg/dL (ref 1.7–2.4)

## 2016-10-26 LAB — PHOSPHORUS: PHOSPHORUS: 4 mg/dL (ref 2.5–4.6)

## 2016-10-26 MED ORDER — SODIUM CHLORIDE 0.9 % IV BOLUS (SEPSIS)
1000.0000 mL | Freq: Once | INTRAVENOUS | Status: AC
  Administered 2016-10-26: 1000 mL via INTRAVENOUS

## 2016-10-26 NOTE — Progress Notes (Signed)
PULMONARY / CRITICAL CARE MEDICINE   Name: Anne Olsen MRN: [id number] DOB: 18-Aug-1976    ADMISSION DATE:  10/04/2016 CONSULTATION DATE:  10/04/2016  REFERRING MD:  Benjiman Core, M.D. / EDP  CHIEF COMPLAINT:  Acute Intracranial Hemorrhage  Brief:   41 y.o. female PMH of HTN who presented 1/25 to [facility] via EMS for headache, weakness, and AMS. CT head revealed an acute intraparenchymal hemorrhage in the right thalamus with a hematoma measuring 6.9 mL  SUBJECTIVE:  EVD in place. Shunt placement cancelled yesterday (due to OR scheduling), hopefully will go today.   VITAL SIGNS: BP 112/89   Pulse 68   Temp 97.8 F (36.6 C) (Axillary)   Resp (!) 21   Ht 5\' 1"  (1.549 m)   Wt 65.3 kg (143 lb 15.4 oz)   LMP  (LMP Unknown)   SpO2 98%   BMI 27.20 kg/m   HEMODYNAMICS:    VENTILATOR SETTINGS: FiO2 (%):  [28 %] 28 %  INTAKE / OUTPUT: I/O last 3 completed shifts: In: 1805 [I.V.:590; NG/GT:105; IV Piggyback:1110] Out: 1756 [Urine:1440; Drains:316]  PHYSICAL EXAMINATION: General: Adult female, lying in bed , no distress    Neuro: Eyes closed, does not follow commands, does not moves extremities  HEENT: EVD in place    Cardiovascular: RRR, NI S1/S2, no MRG  Lungs: Clear, diminished at bases, non-labored on ATC  Abdomen: non-tender, non-distended, active bowel sounds   Musculoskeletal: no acute  Skin:  Warm, dry, intact   LABS:  BMET  Recent Labs Lab 10/25/16 0256 10/25/16 1051 10/26/16 0806  NA 139 141 142  K 6.4* 4.8 4.3  CL 98* 98* 107  CO2 29 29 25   BUN 51* 43* 49*  CREATININE 1.15* 1.15* 1.06*  GLUCOSE 102* 143* 95    Electrolytes  Recent Labs Lab 10/24/16 0339 10/25/16 0256 10/25/16 1051 10/26/16 0806  CALCIUM 9.9 10.2 10.2 9.0  MG 2.4 2.6*  --  2.2  PHOS 5.9* 6.0*  --  4.0    CBC  Recent Labs Lab 10/23/16 0321 10/24/16 0339 10/25/16 0256  WBC 13.9* 14.2* 14.3*  HGB 14.0 14.7 15.9*  HCT 43.9 46.5* 50.1*  PLT 439* 426* 409*     Coag's No results for input(s): APTT, INR in the last 168 hours.  Sepsis Markers No results for input(s): LATICACIDVEN, PROCALCITON, O2SATVEN in the last 168 hours.  ABG No results for input(s): PHART, PCO2ART, PO2ART in the last 168 hours.  Liver Enzymes  Recent Labs Lab 10/23/16 0321  AST 21  ALT 28  ALKPHOS 53  BILITOT 0.3  ALBUMIN 2.8*    Cardiac Enzymes No results for input(s): TROPONINI, PROBNP in the last 168 hours.  Glucose  Recent Labs Lab 10/25/16 1216 10/25/16 1517 10/25/16 1928 10/25/16 2308 10/26/16 0304 10/26/16 0801  GLUCAP 162* 122* 115* 94 82 99    Imaging No results found.   STUDIES:  CT HEAD CODE STROKE 1/25 >  Acute intraparenchymal hemorrhage in the right thalamus with hematoma measuring 6.9 mL. Intraventricular penetration. Small amount of subarachnoid penetration. CTA of the neck and head 1/25 > do not show any atherosclerotic narrowing. The vessels are somewhat tortuous suggesting hypertension. No sign of aneurysm or vascular malformation to explain the right thalamic hemorrhage. CT HEAD 1/26 > decreased volume of right thalamic hemorrhage, intraventricular and subarachnoid hemorrhage, progressive hydrocephalus EEG 1/31>This EEG is consistent with a focal area of cerebral dysfunction in the right hemisphere superimposed on a generalized nonspecific cerebral dysfunction as can be seen  with medication effect among other causes. ECHO > 55-60%  CULTURES: MRSA PCR 1/25:  Negative Blood 2/1 > Negative  U/A 2/7 > Positive Bacteria/Leuko Urine Culture 2/8 > klebsiella  Blood 2/8 > (-) Sputum 2/8 (-)  ANTIBIOTICS: Unasyn 1/26 > 2/1 Rocephin 2/8 > 2/14  SIGNIFICANT EVENTS: 1/25 - Admit with AMS, intubated  1/26 - IVC drain placed  1/31 - PEG/Trach 2/04 - 24/7 ATC  LINES/TUBES: OETT 7.5 1/24 >1/31 Perc trach (DF) 1/31> PEG 1/31 >  DISCUSSION: 41 y.o. female status post acute intraparenchymal hemorrhage in the right thalamus  with intraventricular penetration as well as a small amount of penetration into the subarachnoid space  ASSESSMENT / PLAN:  PULMONARY A: Acute Respiratory Failure in setting of ICH Tracheostomy 1/31 per DF  P:   Continue TC as tolerated  NTS PRN Trach care per protocol   CARDIOVASCULAR A:  HTN P:  Cardiac Monitoring  Labetalol PRN  RENAL A:   Hyperkalemia . Likely patient has decreased K urinary secretion vs hypoaldosterism P:   Trend BMP Hold Lasix today - will likely need daily lasix  Replace electrolytes as needed   GASTROINTESTINAL A:   S/P PEG 1/31  P:   PPI  TF per nutrition - Hold until noted if going to surgery   HEMATOLOGIC A:   VTE prophylaxis P:  Heparin SQ SCD Trend CBC   INFECTIOUS A:   Klebsiella UTI  -Treated Rocephin  P:   Off antibiotics.  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 CT scan on 2/14 with worsening HCP.  P:   Per Neurosurgery  Plan for VP Shunt placement 2/16 ?   RASS goal: 0    FAMILY  - Updates: family updated at bedside. Plan d/w mother.   - Inter-disciplinary family meet or Palliative Care meeting due by:  Ongoing    Jovita Kussmaul, AG-ACNP [facility]  Pgr: [phone]  PCCM Pgr: [phone]    ATTENDING NOTE / ATTESTATION NOTE :   I have discussed the case with the resident/APP  Jovita Kussmaul NP.   I agree with the resident/APP's  history, physical examination, assessment, and plans.    I have edited the above note and modified it according to our agreed history, physical examination, assessment and plan.   Briefly, 41 y.o.female PMH of HTN who presented 1/25 to [facility] via EMS for headache, weakness, and AMS. CT head revealed an acute intraparenchymal hemorrhage in the right thalamus with a hematoma measuring 6.9 mL. Patient has been  trached and PEGed. Tolerating trache collar trials.   No issues last 24 hrs.  She was supposed to have a VPS on 2/15 but was postponed until this am.   Vitals:  Vitals:   10/26/16 0800 10/26/16 0900 10/26/16 1000 10/26/16 1100  BP: 106/83 114/88 112/89 111/77  Pulse: 81  68 75  Resp: (!) 21 19 (!) 21 (!) 22  Temp: 97.8 F (36.6 C)     TempSrc: Axillary     SpO2: 98%  98% 97%  Weight:      Height:        Constitutional/General: well-nourished, well-developed,  not in any distress  Body mass index is 27.2 kg/m. Wt Readings from Last 3 Encounters:  10/25/16 65.3 kg (143 lb 15.4 oz)    HEENT: PERLA,  anicteric sclerae. (-) Oral thrush. Trache in place.   Neck: No masses. Midline trachea. No JVD, (-) LAD. (-) bruits appreciated.  Respiratory/Chest: Grossly normal chest. (-) deformity. (-) Accessory muscle use.  Symmetric expansion. Diminished BS on both lower lung zones. (-) wheezing, crackles, Rhonchi in BULF (-) egophony  Cardiovascular: Regular rate and  rhythm, heart sounds normal, no murmur or gallops,  Trace peripheral edema  Gastrointestinal:  Normal bowel sounds. Soft, non-tender. No hepatosplenomegaly.  (-) masses. (+) PEG in place.   Musculoskeletal:  Normal muscle tone.   Extremities: Grossly normal. (-) clubbing, cyanosis.  Trace  edema  Skin: (-) rash,lesions seen.   Neurological/Psychiatric : laying in bed, does not follow commands, no verbal output.     CBC Recent Labs     10/24/16  0339  10/25/16  0256  WBC  14.2*  14.3*  HGB  14.7  15.9*  HCT  46.5*  50.1*  PLT  426*  409*    Coag's No results for input(s): APTT, INR in the last 72 hours.  BMET Recent Labs     10/25/16  0256  10/25/16  1051  10/26/16  0806  NA  139  141  142  K  6.4*  4.8  4.3  CL  98*  98*  107  CO2  29  29  25   BUN  51*  43*  49*  CREATININE  1.15*  1.15*  1.06*  GLUCOSE  102*  143*  95    Electrolytes Recent Labs     10/24/16  0339  10/25/16  0256   10/25/16  1051  10/26/16  0806  CALCIUM  9.9  10.2  10.2  9.0  MG  2.4  2.6*   --   2.2  PHOS  5.9*  6.0*   --   4.0    Sepsis Markers No results for input(s): PROCALCITON, O2SATVEN in the last 72 hours.  Invalid input(s): LACTICACIDVEN  ABG No results for input(s): PHART, PCO2ART, PO2ART in the last 72 hours.  Liver Enzymes No results for input(s): AST, ALT, ALKPHOS, BILITOT, ALBUMIN in the last 72 hours.  Cardiac Enzymes No results for input(s): TROPONINI, PROBNP in the last 72 hours.  Glucose Recent Labs     10/25/16  1216  10/25/16  1517  10/25/16  1928  10/25/16  2308  10/26/16  0304  10/26/16  0801  GLUCAP  162*  122*  115*  94  82  99    Imaging Ct Head Wo Contrast  Result Date: 10/24/2016 CLINICAL DATA:  41 y/o  F; hydrocephalus with shunt. EXAM: CT HEAD WITHOUT CONTRAST TECHNIQUE: Contiguous axial images were obtained from the base of the skull through the vertex without intravenous contrast. COMPARISON:  10/19/2016 MRI of the head.  10/14/2016 CT of the head. FINDINGS: Brain: Decreased attenuation of intra-axial hematoma centered in the right thalamus and midbrain and hemorrhage within occipital horns of lateral ventricles. Hypoattenuation of the brain with swelling of the brainstem compatible with edema is grossly stable. Hydrocephalus has worsened with increased size of lateral and third ventricles, diffuse sulcal effacement, and a minimal increase in tonsillar herniation and crowding in the posterior fossa. Lucency along the tract of right frontal approach ventriculostomy catheter is stable. The catheter tip abuts the septum pellucidum and frontal horn of right lateral ventricle. No evidence for new large acute infarct or hemorrhage. Vascular: No hyperdense vessel or unexpected calcification. Skull: Normal. Negative for fracture or focal lesion. Sinuses/Orbits: No  acute finding. Other: None. IMPRESSION: 1. Worsening hydrocephalus from prior MRI and CT with diffuse  sulcal effacement and possible slight increase in cerebellar tonsillar herniation. 2. Decreased attenuation of midbrain and thalamus hemorrhage and stable associated edema and swelling of the brainstem. 3. No evidence for new large acute infarct or hemorrhage. These results will be called to the ordering clinician or representative by the Radiologist Assistant, and communication documented in the PACS or zVision Dashboard. Electronically Signed   By: Mitzi HansenLance  Furusawa-Stratton M.D.   On: 10/24/2016 14:31   Assessment/Plan : S/P Tracheostomy for Acute respiratory failure related to ICH.  - keep o2 sats > 88% - ATC as tolerated.  - cont  Duonebs qid   ICH - per Neurosurgery - CSF fluid sent for culture on Monday and culture is (-). - plan for VPS on 2/16. Once with VPS, pt can go to the floors.    Malnutrition, moderate - cont TF   UTI, Klebsiella pna - treated with Rocephin x 7 days   Hyperkalemia, on and off.   - TF has been changed to Nepro - I think she does not excrete K as much renally.  Her K this am was N.  She responds to kayexalate and lasix.    Family :Family updated at length today.  Mother updated at bedside.    Pollie MeyerJ. Angelo A de Dios, MD 10/26/2016, 11:32 AM [facility] Pager [phone] After 3 pm or if no answer, call [phone]

## 2016-10-26 NOTE — Progress Notes (Signed)
     I/O last 3 completed shifts: In: 1105 [I.V.:470; NG/GT:525; IV Piggyback:110] Out: 2098 [Urine:1820; Drains:278]   Recent Labs Lab 10/23/16 0321 10/23/16 1421 10/24/16 0339 10/25/16 0256 10/25/16 1051  CREATININE 1.02* 1.11* 1.06* 1.15* 1.15*      Falling ur op perRN  Plan 1L fluid bolus  Thanks  Dr. Kalman ShanMurali Haya Hemler, M.D., [facility] CtrF.C.C.P Pulmonary and Critical Care Medicine Staff Physician [facility] Pager: [phone], If no answer or between  15:00h - 7:00h: call [phone]  10/26/2016 12:28 AM

## 2016-10-26 NOTE — Progress Notes (Signed)
Patient ID: Anne Olsen, female   DOB: [birth year], 41 y.o.   MRN: [id number] ventric patent, no change neuro, withdraws only on R, gaze dysconjugate  Maybe try again for OR next week, repeat csf studies sunday

## 2016-10-26 NOTE — Progress Notes (Signed)
Physical Therapy Treatment Patient Details Name: Anne Olsen MRN: [id number] DOB: [birth year] Today's Date: 10/26/2016    History of Present Illness pt presents with Intraparenchymal Hemorrhage in R Thalamus.  She developed hydrocephalus and underwent ventriculostomy 10/05/16.  Repeat MRI 10/19/16 showed Intra-axial hematoma.    PT Comments    Pt remains lethargic and not following any directions.  Pt does resist when attempting to open pt's eyes.  Pt moves R UE and LE, but not to command or purposefully.  Pt does withdraw to pain on all 4, however pt with delay on L side.  Will continue to follow for arousal and participation.    Follow Up Recommendations  SNF     Equipment Recommendations  None recommended by PT    Recommendations for Other Services       Precautions / Restrictions Precautions Precautions: Fall Precaution Comments: venticular drain; trach collar; PEG Restrictions Weight Bearing Restrictions: No    Mobility  Bed Mobility Overal bed mobility: Needs Assistance Bed Mobility: Supine to Sit;Sit to Supine;Rolling Rolling: Total assist;+2 for physical assistance   Supine to sit: Total assist;+2 for physical assistance Sit to supine: Total assist;+2 for physical assistance   General bed mobility comments: No attempts at participation.    Transfers                    Ambulation/Gait                 Stairs            Wheelchair Mobility    Modified Rankin (Stroke Patients Only) Modified Rankin (Stroke Patients Only) Pre-Morbid Rankin Score: No symptoms Modified Rankin: Severe disability     Balance Overall balance assessment: Needs assistance Sitting-balance support: Feet supported Sitting balance-Leahy Scale: Zero Sitting balance - Comments: total assist                             Cognition Arousal/Alertness: Lethargic Behavior During Therapy: Flat affect Overall Cognitive Status: Difficult to assess                  General Comments: pt keeps eyes closed and resists opening when PT attempts to open eyes.  pt does move R side but not purposeful or to command.      Exercises      General Comments        Pertinent Vitals/Pain Pain Assessment: Faces Faces Pain Scale: No hurt    Home Living                      Prior Function            PT Goals (current goals can now be found in the care plan section) Acute Rehab PT Goals Patient Stated Goal: pt unable to state. PT Goal Formulation: Patient unable to participate in goal setting Time For Goal Achievement: 11/02/16 Potential to Achieve Goals: Fair Progress towards PT goals: Not progressing toward goals - comment (Lethargic)    Frequency    Min 2X/week      PT Plan Current plan remains appropriate    Co-evaluation             End of Session Equipment Utilized During Treatment: Oxygen (trach collar) Activity Tolerance: Patient limited by lethargy Patient left: in bed;with call bell/phone within reach;with family/visitor present     Time: 4098-1191 PT Time Calculation (min) (ACUTE ONLY): 29 min  Charges:  $  Therapeutic Activity: 23-37 mins                    G CodesSunny Schlein:      Girtha Kilgore F Jacon Whetzel, [place] CarolinaPT [phone] 10/26/2016, 12:15 PM

## 2016-10-27 LAB — BASIC METABOLIC PANEL
Anion gap: 10 (ref 5–15)
BUN: 44 mg/dL — AB (ref 6–20)
CALCIUM: 9.4 mg/dL (ref 8.9–10.3)
CO2: 27 mmol/L (ref 22–32)
CREATININE: 0.98 mg/dL (ref 0.44–1.00)
Chloride: 107 mmol/L (ref 101–111)
GFR calc Af Amer: >60 mL/min (ref >60)
GFR calc non Af Amer: >60 mL/min (ref >60)
GLUCOSE: 103 mg/dL — AB (ref 65–99)
Potassium: 4.3 mmol/L (ref 3.5–5.1)
Sodium: 144 mmol/L (ref 135–145)

## 2016-10-27 LAB — GLUCOSE, CAPILLARY
GLUCOSE-CAPILLARY: 113 mg/dL — AB (ref 65–99)
Glucose-Capillary: 107 mg/dL — ABNORMAL HIGH (ref 65–99)
Glucose-Capillary: 111 mg/dL — ABNORMAL HIGH (ref 65–99)
Glucose-Capillary: 120 mg/dL — ABNORMAL HIGH (ref 65–99)
Glucose-Capillary: 108 mg/dL — ABNORMAL HIGH (ref 65–99)
Glucose-Capillary: 119 mg/dL — ABNORMAL HIGH (ref 65–99)

## 2016-10-27 LAB — CBC
HCT: 46.6 % — ABNORMAL HIGH (ref 36.0–46.0)
Hemoglobin: 14.7 g/dL (ref 12.0–15.0)
MCH: 30.4 pg (ref 26.0–34.0)
MCHC: 31.5 g/dL (ref 30.0–36.0)
MCV: 96.5 fL (ref 78.0–100.0)
Platelets: 273 10*3/uL (ref 150–400)
RBC: 4.83 MIL/uL (ref 3.87–5.11)
RDW: 13.7 % (ref 11.5–15.5)
WBC: 11 10*3/uL — ABNORMAL HIGH (ref 4.0–10.5)

## 2016-10-27 LAB — MAGNESIUM: MAGNESIUM: 2.2 mg/dL (ref 1.7–2.4)

## 2016-10-27 LAB — PHOSPHORUS: PHOSPHORUS: 4 mg/dL (ref 2.5–4.6)

## 2016-10-27 MED ORDER — HEPARIN SODIUM (PORCINE) 5000 UNIT/ML IJ SOLN
5000.0000 [IU] | Freq: Three times a day (TID) | INTRAMUSCULAR | Status: DC
  Administered 2016-10-27 – 2016-11-15 (×55): 5000 [IU] via SUBCUTANEOUS
  Filled 2016-10-27 (×55): qty 1

## 2016-10-27 MED ORDER — FREE WATER
100.0000 mL | Freq: Three times a day (TID) | Status: DC
  Administered 2016-10-27 – 2016-11-10 (×37): 100 mL

## 2016-10-27 MED ORDER — IPRATROPIUM-ALBUTEROL 0.5-2.5 (3) MG/3ML IN SOLN: 3.0000 mL | Freq: Four times a day (QID) | RESPIRATORY_TRACT | Status: DC | PRN

## 2016-10-27 NOTE — Progress Notes (Signed)
Patient ID: Anne JerichoViviana Meddaugh, female   DOB: Jan 13, 1976, 41 y.o.   MRN: [id number] Vital signs have been stable IVC continues drainage Will obtain sample of CSF tomorrow to check cultures and cell counts Plan is for shunting procedure midweek if fluid remained stable

## 2016-10-27 NOTE — Progress Notes (Signed)
PULMONARY / CRITICAL CARE MEDICINE   Name: Anne Olsen MRN: [id number] DOB: 1976-08-02    ADMISSION DATE:  10/04/2016 CONSULTATION DATE:  10/04/2016  REFERRING MD:  Davonna Belling, M.D. / EDP  CHIEF COMPLAINT:  Acute Intracranial Hemorrhage  Brief:   41 y.o. female PMH of HTN who presented 1/25 to [facility] via EMS for headache, weakness, and AMS. CT head revealed an acute intraparenchymal hemorrhage in the right thalamus with a hematoma measuring 6.9 mL  SUBJECTIVE:  EVD in place. Shunt placement cancelled yesterday , plans for mid week .    VITAL SIGNS: BP 105/79   Pulse 84   Temp 98.1 F (36.7 C) (Axillary)   Resp (!) 24   Ht _0  (1.549 m)   Wt 147 lb 0.8 oz (66.7 kg)   LMP  (LMP Unknown)   SpO2 97%   BMI 27.78 kg/m   HEMODYNAMICS:    VENTILATOR SETTINGS: FiO2 (%):  [28 %] 28 %  INTAKE / OUTPUT: I/O last 3 completed shifts: In: 2180.8 [I.V.:488; Other:120; NG/GT:572.8; IV Piggyback:1000] Out: 4401 [Urine:1410; Drains:305]  PHYSICAL EXAMINATION: General: Adult female, lying in bed , no distress    Neuro: Eyes closed, does not follow commands, moved right foot to touch  HEENT: EVD in place    Neck Trach in place /TC Cardiovascular: RRR, NI S1/S2, no MRG  Lungs: Clear, diminished at bases, non-labored on ATC  Abdomen: non-tender, non-distended, active bowel sounds   Musculoskeletal: no acute  Skin:  Warm, dry, intact   LABS:  BMET  Recent Labs Lab 10/25/16 1051 10/26/16 0806 10/27/16 0316  NA 141 142 144  K 4.8 4.3 4.3  CL 98* 107 107  CO2 _1 BUN 43* 49* 44*  CREATININE 1.15* 1.06* 0.98  GLUCOSE 143* 95 103*    Electrolytes  Recent Labs Lab 10/25/16 0256 10/25/16 1051 10/26/16 0806 10/27/16 0316  CALCIUM 10.2 10.2 9.0 9.4  MG 2.6*  --  2.2 2.2  PHOS 6.0*  --  4.0 4.0    CBC  Recent Labs Lab 10/24/16 0339 10/25/16 0256 10/27/16 0316  WBC 14.2* 14.3* 11.0*  HGB 14.7 15.9* 14.7  HCT 46.5* 50.1* 46.6*  PLT 426* 409*  273    Coag's No results for input(s): APTT, INR in the last 168 hours.  Sepsis Markers No results for input(s): LATICACIDVEN, PROCALCITON, O2SATVEN in the last 168 hours.  ABG No results for input(s): PHART, PCO2ART, PO2ART in the last 168 hours.  Liver Enzymes  Recent Labs Lab 10/23/16 0321  AST 21  ALT 28  ALKPHOS 53  BILITOT 0.3  ALBUMIN 2.8*    Cardiac Enzymes No results for input(s): TROPONINI, PROBNP in the last 168 hours.  Glucose  Recent Labs Lab 10/26/16 1203 10/26/16 1618 10/26/16 1921 10/26/16 2317 10/27/16 0301 10/27/16 0810  GLUCAP 75 79 79 94 107* 111*    Imaging No results found.   STUDIES:  CT HEAD CODE STROKE 1/25 >  Acute intraparenchymal hemorrhage in the right thalamus with hematoma measuring 6.9 mL. Intraventricular penetration. Small amount of subarachnoid penetration. CTA of the neck and head 1/25 > do not show any atherosclerotic narrowing. The vessels are somewhat tortuous suggesting hypertension. No sign of aneurysm or vascular malformation to explain the right thalamic hemorrhage. CT HEAD 1/26 > decreased volume of right thalamic hemorrhage, intraventricular and subarachnoid hemorrhage, progressive hydrocephalus EEG 1/31>This EEG is consistent with a focal area of cerebral dysfunction in the right hemisphere superimposed on a generalized nonspecific  cerebral dysfunction as can be seen with medication effect among other causes. ECHO > 55-60%  CULTURES: MRSA PCR 1/25:  Negative Blood 2/1 > Negative  U/A 2/7 > Positive Bacteria/Leuko Urine Culture 2/8 > klebsiella  Blood 2/8 > (-) Sputum 2/8 (-)  ANTIBIOTICS: Unasyn 1/26 > 2/1 Rocephin 2/8 > 2/14  SIGNIFICANT EVENTS: 1/25 - Admit with AMS, intubated  1/26 - IVC drain placed  1/31 - PEG/Trach 2/04 - 24/7 ATC  LINES/TUBES: OETT 7.5 1/24 >1/31 Perc trach (DF) 1/31> PEG 1/31 >  DISCUSSION: 41 y.o. female status post acute intraparenchymal hemorrhage in the right  thalamus with intraventricular penetration as well as a small amount of penetration into the subarachnoid space  ASSESSMENT / PLAN:  PULMONARY A: Acute Respiratory Failure in setting of ICH Tracheostomy 1/31 per DF  P:   Continue TC as tolerated  NTS PRN Trach care per protocol  Duoneb   CARDIOVASCULAR A:  HTN P:  Cardiac Monitoring  Labetalol PRN  RENAL A:   Hyperkalemia . Likely patient has decreased K urinary secretion vs hypoaldosterism P:   Trend BMP Hold Lasix today - will likely need daily lasix -remains neg bal  Replace electrolytes as needed   GASTROINTESTINAL A:   S/P PEG 1/31  P:   PPI  TF per nutrition - Hold until noted if going to surgery   HEMATOLOGIC A:   VTE prophylaxis P:  SCD Trend CBC  ?restart Hep Sq if no surgery for few days.   INFECTIOUS A:   Klebsiella UTI  -Treated Rocephin  P:   Off antibiotics.  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 CT scan on 2/14 with worsening HCP.  P:   Per Neurosurgery  Plan for VP Shunt placement Next week per NS  RASS goal: 0    FAMILY  - Updates: family updated at bedside. No family at bedside 2/17  - Inter-disciplinary family meet or Palliative Care meeting due by:  Ongoing      Elma Shands NP-C  [facility]  [phone]   10/27/2016

## 2016-10-28 DIAGNOSIS — G9340 Encephalopathy, unspecified: Secondary | ICD-10-CM

## 2016-10-28 DIAGNOSIS — I10 Essential (primary) hypertension: Secondary | ICD-10-CM

## 2016-10-28 LAB — RENAL FUNCTION PANEL
Albumin: 2.7 g/dL — ABNORMAL LOW (ref 3.5–5.0)
Anion gap: 7 (ref 5–15)
BUN: 39 mg/dL — AB (ref 6–20)
CALCIUM: 9.2 mg/dL (ref 8.9–10.3)
CHLORIDE: 107 mmol/L (ref 101–111)
CO2: 29 mmol/L (ref 22–32)
CREATININE: 1 mg/dL (ref 0.44–1.00)
GFR calc Af Amer: >60 mL/min (ref >60)
Glucose, Bld: 124 mg/dL — ABNORMAL HIGH (ref 65–99)
Phosphorus: 3.7 mg/dL (ref 2.5–4.6)
Potassium: 3.7 mmol/L (ref 3.5–5.1)
Sodium: 143 mmol/L (ref 135–145)

## 2016-10-28 LAB — CBC WITH DIFFERENTIAL/PLATELET
Basophils Absolute: 0 10*3/uL (ref 0.0–0.1)
Basophils Relative: 0 %
EOS PCT: 3 %
Eosinophils Absolute: 0.3 10*3/uL (ref 0.0–0.7)
HCT: 46.8 % — ABNORMAL HIGH (ref 36.0–46.0)
Hemoglobin: 14.5 g/dL (ref 12.0–15.0)
LYMPHS ABS: 2 10*3/uL (ref 0.7–4.0)
LYMPHS PCT: 24 %
MCH: 29.7 pg (ref 26.0–34.0)
MCHC: 31 g/dL (ref 30.0–36.0)
MCV: 95.7 fL (ref 78.0–100.0)
MONO ABS: 0.5 10*3/uL (ref 0.1–1.0)
Monocytes Relative: 6 %
Neutro Abs: 5.5 10*3/uL (ref 1.7–7.7)
Neutrophils Relative %: 67 %
PLATELETS: 277 10*3/uL (ref 150–400)
RBC: 4.89 MIL/uL (ref 3.87–5.11)
RDW: 13.4 % (ref 11.5–15.5)
WBC: 8.3 10*3/uL (ref 4.0–10.5)

## 2016-10-28 LAB — URINALYSIS, ROUTINE W REFLEX MICROSCOPIC
BILIRUBIN URINE: NEGATIVE
Glucose, UA: NEGATIVE mg/dL
Ketones, ur: NEGATIVE mg/dL
Leukocytes, UA: NEGATIVE
Nitrite: NEGATIVE
Protein, ur: 30 mg/dL — AB
SPECIFIC GRAVITY, URINE: 1.03 (ref 1.005–1.030)
WBC, UA: NONE SEEN WBC/hpf (ref 0–5)
pH: 5 (ref 5.0–8.0)

## 2016-10-28 LAB — GLUCOSE, CAPILLARY
GLUCOSE-CAPILLARY: 114 mg/dL — AB (ref 65–99)
GLUCOSE-CAPILLARY: 114 mg/dL — AB (ref 65–99)
GLUCOSE-CAPILLARY: 115 mg/dL — AB (ref 65–99)
Glucose-Capillary: 107 mg/dL — ABNORMAL HIGH (ref 65–99)
Glucose-Capillary: 100 mg/dL — ABNORMAL HIGH (ref 65–99)
Glucose-Capillary: 104 mg/dL — ABNORMAL HIGH (ref 65–99)

## 2016-10-28 LAB — MAGNESIUM: Magnesium: 2.3 mg/dL (ref 1.7–2.4)

## 2016-10-28 LAB — PROTEIN AND GLUCOSE, CSF
Glucose, CSF: 76 mg/dL — ABNORMAL HIGH (ref 40–70)
Total  Protein, CSF: 7 mg/dL — ABNORMAL LOW (ref 15–45)

## 2016-10-28 MED ORDER — BETHANECHOL CHLORIDE 10 MG PO TABS
10.0000 mg | ORAL_TABLET | Freq: Four times a day (QID) | ORAL | Status: DC
  Administered 2016-10-28 – 2016-11-06 (×29): 10 mg
  Filled 2016-10-28 (×39): qty 1

## 2016-10-28 NOTE — Progress Notes (Signed)
Patient ID: Anne JerichoViviana Lanpher, female   DOB: 10-21-1975, 41 y.o.   MRN: [id number] Patient with periods of alertness  nurse reports patient able to open eyes and smile possibly following commands with foot movement. CSF drawn steriley from ventriculostomy.sent for culture and cell count./

## 2016-10-28 NOTE — Progress Notes (Signed)
PULMONARY / CRITICAL CARE MEDICINE   Name: Anne Olsen MRN: [id number] DOB: [birth year]    ADMISSION DATE:  10/04/2016 CONSULTATION DATE:  10/04/2016  REFERRING MD:  Davonna Belling, M.D. / EDP  CHIEF COMPLAINT:  Acute Intracranial Hemorrhage  Brief:   41 y.o. female PMH of HTN who presented 1/25 to [facility] via EMS for headache, weakness, and AMS. CT head revealed an acute intraparenchymal hemorrhage in the right thalamus with a hematoma measuring 6.9 mL  SUBJECTIVE:  EVD remains, VP shunt cancelled last week x 2 , plans for this week .  Family at bedside ? Moving right foot to command vs reflex    VITAL SIGNS: BP 94/79   Pulse 82   Temp 98.4 F (36.9 C) (Axillary)   Resp (!) 25   Ht _0  (1.549 m)   Wt 147 lb 7.8 oz (66.9 kg)   LMP  (LMP Unknown)   SpO2 96%   BMI 27.87 kg/m   HEMODYNAMICS:    VENTILATOR SETTINGS: FiO2 (%):  [28 %] 28 %  INTAKE / OUTPUT: I/O last 3 completed shifts: In: 2082.9 [I.V.:360; Other:120; NG/GT:1602.9] Out: 2641 [RAXEN:4076; Drains:521]  PHYSICAL EXAMINATION: General: Adult female, lying in bed , no distress    Neuro: Eyes closed,, moved right foot to touch  ? Command , moves right arm spontanousely  HEENT: EVD in place    Neck Trach in place /TC Cardiovascular: RRR, NI S1/S2, no MRG  Lungs: Clear, diminished at bases, non-labored on ATC  Abdomen: non-tender, non-distended, active bowel sounds   Musculoskeletal: no acute  Skin:  Warm, dry, intact   LABS:  BMET  Recent Labs Lab 10/26/16 0806 10/27/16 0316 10/28/16 0254  NA 142 144 143  K 4.3 4.3 3.7  CL 107 107 107  CO2 _1 BUN 49* 44* 39*  CREATININE 1.06* 0.98 1.00  GLUCOSE 95 103* 124*    Electrolytes  Recent Labs Lab 10/26/16 0806 10/27/16 0316 10/28/16 0254  CALCIUM 9.0 9.4 9.2  MG 2.2 2.2 2.3  PHOS 4.0 4.0 3.7    CBC  Recent Labs Lab 10/25/16 0256 10/27/16 0316 10/28/16 0254  WBC 14.3* 11.0* 8.3  HGB 15.9* 14.7 14.5  HCT 50.1* 46.6*  46.8*  PLT 409* 273 277    Coag's No results for input(s): APTT, INR in the last 168 hours.  Sepsis Markers No results for input(s): LATICACIDVEN, PROCALCITON, O2SATVEN in the last 168 hours.  ABG No results for input(s): PHART, PCO2ART, PO2ART in the last 168 hours.  Liver Enzymes  Recent Labs Lab 10/23/16 0321 10/28/16 0254  AST 21  --   ALT 28  --   ALKPHOS 53  --   BILITOT 0.3  --   ALBUMIN 2.8* 2.7*    Cardiac Enzymes No results for input(s): TROPONINI, PROBNP in the last 168 hours.  Glucose  Recent Labs Lab 10/27/16 1152 10/27/16 1546 10/27/16 1928 10/27/16 2345 10/28/16 0305 10/28/16 0750  GLUCAP 120* 108* 119* 113* 107* 114*    Imaging No results found.   STUDIES:  CT HEAD CODE STROKE 1/25 >  Acute intraparenchymal hemorrhage in the right thalamus with hematoma measuring 6.9 mL. Intraventricular penetration. Small amount of subarachnoid penetration. CTA of the neck and head 1/25 > do not show any atherosclerotic narrowing. The vessels are somewhat tortuous suggesting hypertension. No sign of aneurysm or vascular malformation to explain the right thalamic hemorrhage. CT HEAD 1/26 > decreased volume of right thalamic hemorrhage, intraventricular and subarachnoid hemorrhage, progressive  hydrocephalus EEG 1/31>This EEG is consistent with a focal area of cerebral dysfunction in the right hemisphere superimposed on a generalized nonspecific cerebral dysfunction as can be seen with medication effect among other causes. ECHO > 55-60%  CULTURES: MRSA PCR 1/25:  Negative Blood 2/1 > Negative  U/A 2/7 > Positive Bacteria/Leuko Urine Culture 2/8 > klebsiella  Blood 2/8 > (-) Sputum 2/8 (-)  ANTIBIOTICS: Unasyn 1/26 > 2/1 Rocephin 2/8 > 2/14  SIGNIFICANT EVENTS: 1/25 - Admit with AMS, intubated  1/26 - IVC drain placed  1/31 - PEG/Trach 2/04 - 24/7 ATC  LINES/TUBES: OETT 7.5 1/24 >1/31 Perc trach (DF) 1/31> PEG 1/31 >  DISCUSSION: 41 y.o.  female status post acute intraparenchymal hemorrhage in the right thalamus with intraventricular penetration as well as a small amount of penetration into the subarachnoid space  ASSESSMENT / PLAN:  PULMONARY A: Acute Respiratory Failure in setting of ICH Tracheostomy 1/31 per DF  P:   Continue TC as tolerated  NTS PRN Trach care per protocol  Duoneb   CARDIOVASCULAR A:  HTN P:  Cardiac Monitoring  Labetalol PRN  RENAL A:   Hyperkalemia .  >resolved P:   Trend BMP Hold Lasix today -  Remain near even bal  Replace electrolytes as needed  Cont free water   GASTROINTESTINAL A:   S/P PEG 1/31  P:   PPI  TF per nutrition    HEMATOLOGIC A:   VTE prophylaxis P:  SCD Trend CBC  Cont  Hep Sq    INFECTIOUS A:   Klebsiella UTI  -Treated Rocephin  P:   Off antibiotics.  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 CT scan on 2/14 with worsening HCP.  P:   Per Neurosurgery  Plan for VP Shunt placement Next week per NS  RASS goal: 0    FAMILY  - Updates: family updated at bedside. No family at bedside 2/17  - Inter-disciplinary family meet or Palliative Care meeting due by:  Ongoing      Hartley Wyke NP-C  [facility]  [phone]   10/28/2016

## 2016-10-29 ENCOUNTER — Other Ambulatory Visit: Payer: Self-pay | Admitting: Neurological Surgery

## 2016-10-29 LAB — GLUCOSE, CAPILLARY
GLUCOSE-CAPILLARY: 105 mg/dL — AB (ref 65–99)
GLUCOSE-CAPILLARY: 101 mg/dL — AB (ref 65–99)
GLUCOSE-CAPILLARY: 107 mg/dL — AB (ref 65–99)
Glucose-Capillary: 101 mg/dL — ABNORMAL HIGH (ref 65–99)
Glucose-Capillary: 103 mg/dL — ABNORMAL HIGH (ref 65–99)
Glucose-Capillary: 111 mg/dL — ABNORMAL HIGH (ref 65–99)

## 2016-10-29 NOTE — Progress Notes (Signed)
Patient ID: Anne JerichoViviana Olsen, female   DOB: 14-May-1976, 41 y.o.   MRN: [id number] Plan shunt placement on Wednesday if all agreeable.

## 2016-10-29 NOTE — Progress Notes (Signed)
Physical Therapy Treatment Patient Details Name: Anne Olsen MRN: [id number] DOB: [birth year] Today's Date: 10/29/2016    History of Present Illness pt presents with Intraparenchymal Hemorrhage in R Thalamus.  She developed hydrocephalus and underwent ventriculostomy 10/05/16.  Repeat MRI 10/19/16 showed Intra-axial hematoma.    PT Comments    Patient continues to be lethargic and not following any commands. Pt moves BUEs, but not purposefully or to command. Withdraws to noxious stimuli in all 4 extremities. Keeps eyes closed for session. No blink to threat bilaterally. Will continue to follow for arousal and participation.    Follow Up Recommendations  SNF     Equipment Recommendations  None recommended by PT    Recommendations for Other Services       Precautions / Restrictions Precautions Precautions: Fall Precaution Comments: venticular drain; trach collar; PEG Restrictions Weight Bearing Restrictions: No    Mobility  Bed Mobility Overal bed mobility: Needs Assistance Bed Mobility: Supine to Sit;Sit to Supine     Supine to sit: Total assist;+2 for physical assistance Sit to supine: Total assist;+2 for physical assistance   General bed mobility comments: No attempts at participation.    Transfers                 General transfer comment: unable   Ambulation/Gait                 Stairs            Wheelchair Mobility    Modified Rankin (Stroke Patients Only) Modified Rankin (Stroke Patients Only) Pre-Morbid Rankin Score: No symptoms Modified Rankin: Severe disability     Balance Overall balance assessment: Needs assistance Sitting-balance support: Feet supported;No upper extremity supported Sitting balance-Leahy Scale: Zero Sitting balance - Comments: total assist; attempts to arouse reticular system with trunk movement sitting EOB.                            Cognition Arousal/Alertness: Lethargic Behavior During  Therapy: Flat affect Overall Cognitive Status: Difficult to assess                 General Comments: pt keeps eyes closed during session. Pt does move R side but not purposeful or to command.      Exercises      General Comments        Pertinent Vitals/Pain Pain Assessment: Faces Faces Pain Scale: No hurt    Home Living                      Prior Function            PT Goals (current goals can now be found in the care plan section) Progress towards PT goals: Not progressing toward goals - comment (lethargy)    Frequency    Min 2X/week      PT Plan Current plan remains appropriate    Co-evaluation             End of Session Equipment Utilized During Treatment: Oxygen Activity Tolerance: Patient limited by lethargy Patient left: in bed;with call bell/phone within reach;with family/visitor present Nurse Communication: Mobility status;Need for lift equipment PT Visit Diagnosis: Hemiplegia and hemiparesis Hemiplegia - Right/Left: Left Hemiplegia - dominant/non-dominant: Non-dominant Hemiplegia - caused by: Other Nontraumatic intracranial hemorrhage     Time: 7829-56210945-1007 PT Time Calculation (min) (ACUTE ONLY): 22 min  Charges:  $Therapeutic Activity: 8-22 mins  G Codes:       Jamal Pavon A Harshil Cavallaro 10/29/2016, 10:30 AM  Mylo Red, PT, DPT [phone]

## 2016-10-29 NOTE — Progress Notes (Signed)
PULMONARY / CRITICAL CARE MEDICINE   Name: Anne JerichoViviana Gamarra MRN: [id number] DOB: [birth year]    ADMISSION DATE:  10/04/2016 CONSULTATION DATE:  10/04/2016  REFERRING MD:  Benjiman CoreNathan Pickering, M.D. / EDP  CHIEF COMPLAINT:  Acute Intracranial Hemorrhage  Brief:   41 y.o. female PMH of HTN who [facility] for headache, weakness, and AMS. CT head revealed an acute intraparenchymal hemorrhage in the right thalamus with a hematoma measuring 6.9 mL  SUBJECTIVE:  EVD remains, for VP shunt 2/21 No acute events overnight.   VITAL SIGNS: BP 111/80   Pulse 70   Temp 98.4 F (36.9 C) (Oral)   Resp (!) 22   Ht 5\' 1"  (1.549 m)   Wt 67 kg (147 lb 11.3 oz)   LMP  (LMP Unknown)   SpO2 98%   BMI 27.91 kg/m   HEMODYNAMICS:    VENTILATOR SETTINGS: FiO2 (%):  [28 %] 28 %  INTAKE / OUTPUT: I/O last 3 completed shifts: In: 2202.9 [I.V.:360; NG/GT:1842.9] Out: 1733 [Urine:1230; Drains:503]  PHYSICAL EXAMINATION:  General: Adult female, lying in bed , no distress    Neuro: Obtunded, no response HEENT: EVD in place    Neck Trach in place /TC 24/7 Cardiovascular: RRR, NI S1/S2, no MRG  Lungs: Clear, diminished at bases, non-labored on ATC  Abdomen: non-tender, non-distended, active bowel sounds   Musculoskeletal: no acute  Skin:  Warm, dry, intact   LABS:  BMET  Recent Labs Lab 10/26/16 0806 10/27/16 0316 10/28/16 0254  NA 142 144 143  K 4.3 4.3 3.7  CL 107 107 107  CO2 25 27 29   BUN 49* 44* 39*  CREATININE 1.06* 0.98 1.00  GLUCOSE 95 103* 124*    Electrolytes  Recent Labs Lab 10/26/16 0806 10/27/16 0316 10/28/16 0254  CALCIUM 9.0 9.4 9.2  MG 2.2 2.2 2.3  PHOS 4.0 4.0 3.7    CBC  Recent Labs Lab 10/25/16 0256 10/27/16 0316 10/28/16 0254  WBC 14.3* 11.0* 8.3  HGB 15.9* 14.7 14.5  HCT 50.1* 46.6* 46.8*  PLT 409* 273 277    Coag's No results for input(s): APTT, INR in the last 168 hours.  Sepsis Markers No results for input(s):  LATICACIDVEN, PROCALCITON, O2SATVEN in the last 168 hours.  ABG No results for input(s): PHART, PCO2ART, PO2ART in the last 168 hours.  Liver Enzymes  Recent Labs Lab 10/23/16 0321 10/28/16 0254  AST 21  --   ALT 28  --   ALKPHOS 53  --   BILITOT 0.3  --   ALBUMIN 2.8* 2.7*    Cardiac Enzymes No results for input(s): TROPONINI, PROBNP in the last 168 hours.  Glucose  Recent Labs Lab 10/28/16 1219 10/28/16 1624 10/28/16 1949 10/28/16 2313 10/29/16 0340 10/29/16 0820  GLUCAP 100* 114* 115* 104* 105* 101*    Imaging No results found.   STUDIES:  CT HEAD CODE STROKE 1/25 >  Acute intraparenchymal hemorrhage in the right thalamus with hematoma measuring 6.9 mL. Intraventricular penetration. Small amount of subarachnoid penetration. CTA of the neck and head 1/25 > do not show any atherosclerotic narrowing. The vessels are somewhat tortuous suggesting hypertension. No sign of aneurysm or vascular malformation to explain the right thalamic hemorrhage. CT HEAD 1/26 > decreased volume of right thalamic hemorrhage, intraventricular and subarachnoid hemorrhage, progressive hydrocephalus EEG 1/31>This EEG is consistent with a focal area of cerebral dysfunction in the right hemisphere superimposed on a generalized nonspecific cerebral dysfunction as can be seen with medication  effect among other causes. ECHO > 55-60%  CULTURES: MRSA PCR 1/25:  Negative Blood 2/1 > Negative  U/A 2/7 > Positive Bacteria/Leuko Urine Culture 2/8 > klebsiella  Blood 2/8 > (-) Sputum 2/8 (-)  ANTIBIOTICS: Unasyn 1/26 > 2/1 Rocephin 2/8 > 2/14  SIGNIFICANT EVENTS: 1/25 - Admit with AMS, intubated  1/26 - IVC drain placed  1/31 - PEG/Trach 2/04 - 24/7 ATC  LINES/TUBES: OETT 7.5 1/24 >1/31 Perc trach (DF) 1/31> PEG 1/31 >  DISCUSSION: 41 y.o. female status post acute intraparenchymal hemorrhage in the right thalamus with intraventricular penetration as well as a small amount of  penetration into the subarachnoid space  ASSESSMENT / PLAN:  PULMONARY A: Acute Respiratory Failure in setting of ICH Tracheostomy 1/31 per DF  P:   Trach collar 24/7 at this point NTS PRN Trach care per protocol  Duoneb PRN  CARDIOVASCULAR A:  HTN - well controlled P:  Cardiac Monitoring  PRN labetalol  RENAL A:   Hyperkalemia .  >resolved P:   Trend BMP Holding lasix Continue free water   GASTROINTESTINAL A:   S/P PEG 1/31  P:   PPI  TF per nutrition    HEMATOLOGIC A:   VTE prophylaxis P:  SCD Trend CBC  Cont  Hep Sq    INFECTIOUS A:   Klebsiella UTI  -s/p ceftriaxone P:   Off antibiotics.  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 CT scan on 2/14 with worsening HCP.  P:   Per Neurosurgery  Plan for VP Shunt 2/21   Will need SNF placement once shunt placed.   FAMILY  - Updates:No family at bedside  - Inter-disciplinary family meet or Palliative Care meeting due by:  Ongoing   Joneen Roach, AGACNP-BC [facility] Pulmonology/Critical Care Pager [phone] or [phone]  10/29/2016 10:38 AM

## 2016-10-29 NOTE — Progress Notes (Signed)
RT note: RT suctioned small amount of thick white secretions. Patient had good strong productive cough with suctioning. Vital signs stable at this time.

## 2016-10-30 ENCOUNTER — Inpatient Hospital Stay (HOSPITAL_COMMUNITY): Payer: 59

## 2016-10-30 LAB — CBC
HCT: 43.7 % (ref 36.0–46.0)
Hemoglobin: 13.6 g/dL (ref 12.0–15.0)
MCH: 29.4 pg (ref 26.0–34.0)
MCHC: 31.1 g/dL (ref 30.0–36.0)
MCV: 94.4 fL (ref 78.0–100.0)
Platelets: 239 10*3/uL (ref 150–400)
RBC: 4.63 MIL/uL (ref 3.87–5.11)
RDW: 13 % (ref 11.5–15.5)
WBC: 7.3 10*3/uL (ref 4.0–10.5)

## 2016-10-30 LAB — BASIC METABOLIC PANEL
ANION GAP: 7 (ref 5–15)
BUN: 33 mg/dL — AB (ref 6–20)
CO2: 29 mmol/L (ref 22–32)
Calcium: 9.4 mg/dL (ref 8.9–10.3)
Chloride: 107 mmol/L (ref 101–111)
Creatinine, Ser: 0.84 mg/dL (ref 0.44–1.00)
Glucose, Bld: 96 mg/dL (ref 65–99)
POTASSIUM: 4.1 mmol/L (ref 3.5–5.1)
SODIUM: 143 mmol/L (ref 135–145)

## 2016-10-30 LAB — GLUCOSE, CAPILLARY
GLUCOSE-CAPILLARY: 98 mg/dL (ref 65–99)
Glucose-Capillary: 92 mg/dL (ref 65–99)
Glucose-Capillary: 100 mg/dL — ABNORMAL HIGH (ref 65–99)
Glucose-Capillary: 115 mg/dL — ABNORMAL HIGH (ref 65–99)
Glucose-Capillary: 88 mg/dL (ref 65–99)
Glucose-Capillary: 131 mg/dL — ABNORMAL HIGH (ref 65–99)

## 2016-10-30 MED ORDER — CHLORHEXIDINE GLUCONATE CLOTH 2 % EX PADS
6.0000 | MEDICATED_PAD | Freq: Once | CUTANEOUS | Status: AC
Start: 2016-10-30 — End: 2016-10-30
  Administered 2016-10-30: 6 via TOPICAL

## 2016-10-30 MED ORDER — CHLORHEXIDINE GLUCONATE CLOTH 2 % EX PADS
6.0000 | MEDICATED_PAD | Freq: Once | CUTANEOUS | Status: AC
  Administered 2016-10-31: 6 via TOPICAL

## 2016-10-30 NOTE — Progress Notes (Signed)
Patient ID: Anne JerichoViviana Olsen, female   DOB: 1976-01-23, 41 y.o.   MRN: [id number] ventric at 15, ICP 12 -15, CSF looks ok, plan shunt tomorrow, CT head tonight

## 2016-10-30 NOTE — Progress Notes (Signed)
When I physically open patient's eyes and talk to her she will follow my finger with her eyes when asked. She will also show me two fingers, give me thumbs up, squeeze my hand and wave to her family with her right hand. Monitoring closely. Family is at the bedside at this time. Inman Fettig, [place] ScrapeSarah E, RN

## 2016-10-30 NOTE — Progress Notes (Signed)
Occupational Therapy Treatment Patient Details Name: Alaina Donati MRN: [id number] DOB: 1975/09/17 Today's Date: 10/30/2016    History of present illness pt presents with Intraparenchymal Hemorrhage in R Thalamus.  She developed hydrocephalus and underwent ventriculostomy 10/05/16.  Repeat MRI 10/19/16 showed Intra-axial hematoma.   OT comments  Pt required max stimuli due to decreased arousal.  She will open eyes partially, then was able to follow simple motor commands inconsistently, but requires mod - max cues to maintain attention.  Wipes face with max A.   Will continue to follow.    Follow Up Recommendations  SNF    Equipment Recommendations  None recommended by OT    Recommendations for Other Services      Precautions / Restrictions Precautions Precautions: Fall Precaution Comments: venticular drain; trach collar; PEG       Mobility Bed Mobility Overal bed mobility: Needs Assistance Bed Mobility: Sit to Supine;Supine to Sit Rolling: Total assist   Supine to sit: Total assist     General bed mobility comments: Pt did pick up Rt LE on command to assist wtih lifting it onto bed   Transfers                 General transfer comment: unable     Balance Overall balance assessment: Needs assistance Sitting-balance support: Single extremity supported Sitting balance-Leahy Scale: Zero Sitting balance - Comments: total A.  Poor head/neck and trunk control                            ADL Overall ADL's : Needs assistance/impaired     Grooming: Wash/dry face;Maximal assistance;Bed level Grooming Details (indicate cue type and reason): Pt will wipe mouth briefly.  Max A, overall to wash face                                       Vision                 Additional Comments: Pt blinks to threat when eyes manually opened.   Gaze appears more conjugate today.  Nystagmus and oscillopsia noted    Perception     Praxis       Cognition   Behavior During Therapy: Flat affect   Area of Impairment: Following commands        Following Commands: Follows one step commands inconsistently       General Comments: Pt keeps eyes closed majority of time.  With max stimulation, she will partially open eyes.  She will look to Lt on command, she squeezed hand, held up two finger, and thumbs up each 1-2x with Rt UE, and lifted Rt LE x 2.  She requires max prompting to maintain arousal.  Tears noted       Exercises     Shoulder Instructions       General Comments      Pertinent Vitals/ Pain       Pain Assessment: Faces Faces Pain Scale: No hurt  Home Living                                          Prior Functioning/Environment              Frequency  Min 2X/week  Progress Toward Goals  OT Goals(current goals can now be found in the care plan section)  Progress towards OT goals: Progressing toward goals (slowly )     Plan Discharge plan needs to be updated    Co-evaluation                 End of Session    OT Visit Diagnosis: Hemiplegia and hemiparesis Hemiplegia - Right/Left: Left Hemiplegia - dominant/non-dominant: Non-Dominant Hemiplegia - caused by: Cerebral infarction   Activity Tolerance Patient limited by lethargy   Patient Left in bed;with call bell/phone within reach   Nurse Communication Mobility status;Other (comment) (Performance )        Time: 1610-96040954-1035 OT Time Calculation (min): 41 min  Charges: OT General Charges $OT Visit: 1 Procedure OT Treatments $Therapeutic Activity: 38-52 mins  Reynolds AmericanWendi Kennith Morss, OTR/L [phone]    [name], [place] Oyama M 10/30/2016, 12:57 PM

## 2016-10-30 NOTE — Progress Notes (Signed)
PULMONARY / CRITICAL CARE MEDICINE   Name: Anne JerichoViviana Mitchner MRN: [id number] DOB: August 19, 1976    ADMISSION DATE:  10/04/2016 CONSULTATION DATE:  10/04/2016  REFERRING MD:  Benjiman CoreNathan Pickering, M.D. / EDP  CHIEF COMPLAINT:  Acute Intracranial Hemorrhage  Brief:   41 y.o. female PMH of HTN who [facility] for headache, weakness, and AMS. CT head revealed an acute intraparenchymal hemorrhage in the right thalamus with a hematoma measuring 6.9 mL  SUBJECTIVE:  EVD remains No distress.   VITAL SIGNS: BP 110/72   Pulse (!) 56   Temp 98.4 F (36.9 C) (Axillary)   Resp 19   Ht 5\' 1"  (1.549 m)   Wt 149 lb 0.5 oz (67.6 kg)   LMP  (LMP Unknown)   SpO2 100%   BMI 28.16 kg/m     VENTILATOR SETTINGS: FiO2 (%):  [28 %] 28 %  INTAKE / OUTPUT:  Intake/Output Summary (Last 24 hours) at 10/30/16 1505 Last data filed at 10/30/16 1445  Gross per 24 hour  Intake          1068.75 ml  Output             1063 ml  Net             5.75 ml     General appearance:  [age] Year old  female, well nourished    NAD, minimally responsive Eyes: anicteric sclerae icteric , moist conjunctivae; PERRL, EOMI bilaterally. Mouth:  membranes and no mucosal ulcerations; normal hard and soft palate Neck: Trachea midline; neck supple, no JVD; # 6 cuffed trach.  Lungs/chest: clear, normal respiratory effort and no intercostal retractions CV: RRR, no MRGs  Abdomen: Soft, non-tender; no masses or HSM Extremities: No peripheral edema or extremity lymphadenopathy Skin: Normal temperature, turgor and texture; no rash, ulcers or subcutaneous nodules Neuro/Psych: minimally responsive will intermittently follow commands. EVD w/ clear output     LABS:  BMET  Recent Labs Lab 10/27/16 0316 10/28/16 0254 10/30/16 0311  NA 144 143 143  K 4.3 3.7 4.1  CL 107 107 107  CO2 27 29 29   BUN 44* 39* 33*  CREATININE 0.98 1.00 0.84  GLUCOSE 103* 124* 96    Electrolytes  Recent Labs Lab  10/26/16 0806 10/27/16 0316 10/28/16 0254 10/30/16 0311  CALCIUM 9.0 9.4 9.2 9.4  MG 2.2 2.2 2.3  --   PHOS 4.0 4.0 3.7  --     CBC  Recent Labs Lab 10/27/16 0316 10/28/16 0254 10/30/16 0311  WBC 11.0* 8.3 7.3  HGB 14.7 14.5 13.6  HCT 46.6* 46.8* 43.7  PLT 273 277 239    Coag's No results for input(s): APTT, INR in the last 168 hours.  Sepsis Markers No results for input(s): LATICACIDVEN, PROCALCITON, O2SATVEN in the last 168 hours.  ABG No results for input(s): PHART, PCO2ART, PO2ART in the last 168 hours.  Liver Enzymes  Recent Labs Lab 10/28/16 0254  ALBUMIN 2.7*    Cardiac Enzymes No results for input(s): TROPONINI, PROBNP in the last 168 hours.  Glucose  Recent Labs Lab 10/29/16 1555 10/29/16 1908 10/29/16 2305 10/30/16 0302 10/30/16 0818 10/30/16 1131  GLUCAP 103* 111* 107* 92 100* 115*    Imaging No results found.   STUDIES:  CT HEAD CODE STROKE 1/25 >  Acute intraparenchymal hemorrhage in the right thalamus with hematoma measuring 6.9 mL. Intraventricular penetration. Small amount of subarachnoid penetration. CTA of the neck and head 1/25 > do not show any  atherosclerotic narrowing. The vessels are somewhat tortuous suggesting hypertension. No sign of aneurysm or vascular malformation to explain the right thalamic hemorrhage. CT HEAD 1/26 > decreased volume of right thalamic hemorrhage, intraventricular and subarachnoid hemorrhage, progressive hydrocephalus EEG 1/31>This EEG is consistent with a focal area of cerebral dysfunction in the right hemisphere superimposed on a generalized nonspecific cerebral dysfunction as can be seen with medication effect among other causes. 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   Simonne Martinet ACNP-BC [facility] Pulmonary/Critical Care Pager # [phone] OR # [phone] if no answer   10/30/2016 3:05 PM

## 2016-10-31 ENCOUNTER — Inpatient Hospital Stay (HOSPITAL_COMMUNITY): Payer: 59

## 2016-10-31 ENCOUNTER — Encounter (HOSPITAL_COMMUNITY): Payer: Self-pay | Admitting: Surgery

## 2016-10-31 ENCOUNTER — Encounter (HOSPITAL_COMMUNITY)
Admission: EM | Disposition: A | Discharge status: Skilled Nursing Facility | Payer: Self-pay | Source: Home / Self Care | Attending: Internal Medicine

## 2016-10-31 ENCOUNTER — Encounter (HOSPITAL_COMMUNITY): Payer: Self-pay | Admitting: Anesthesiology

## 2016-10-31 DIAGNOSIS — J9620 Acute and chronic respiratory failure, unspecified whether with hypoxia or hypercapnia: Secondary | ICD-10-CM

## 2016-10-31 LAB — GLUCOSE, CAPILLARY
GLUCOSE-CAPILLARY: 142 mg/dL — AB (ref 65–99)
Glucose-Capillary: 111 mg/dL — ABNORMAL HIGH (ref 65–99)
Glucose-Capillary: 117 mg/dL — ABNORMAL HIGH (ref 65–99)
Glucose-Capillary: 136 mg/dL — ABNORMAL HIGH (ref 65–99)
Glucose-Capillary: 123 mg/dL — ABNORMAL HIGH (ref 65–99)

## 2016-10-31 LAB — CSF CULTURE W GRAM STAIN: Culture: NO GROWTH

## 2016-10-31 LAB — CSF CULTURE

## 2016-10-31 SURGERY — SHUNT INSERTION VENTRICULAR-PERITONEAL
Anesthesia: General

## 2016-10-31 MED ORDER — CEFAZOLIN SODIUM-DEXTROSE 2-4 GM/100ML-% IV SOLN: 2.0000 g | INTRAVENOUS | Status: DC

## 2016-10-31 MED ORDER — DEXAMETHASONE SODIUM PHOSPHATE 10 MG/ML IJ SOLN: 10.0000 mg | INTRAMUSCULAR | Status: DC

## 2016-10-31 MED ORDER — LABETALOL HCL 5 MG/ML IV SOLN
10.0000 mg | Freq: Once | INTRAVENOUS | Status: AC
  Administered 2016-10-31: 10 mg via INTRAVENOUS
  Filled 2016-10-31: qty 4

## 2016-10-31 NOTE — Anesthesia Preprocedure Evaluation (Addendum)
Anesthesia Evaluation  Patient identified by MRN, date of birth, ID band Patient unresponsive    Reviewed: Allergy & Precautions, NPO status , Patient's Chart, lab work & pertinent test results  History of Anesthesia Complications Negative for: history of anesthetic complications  Airway Mallampati: Trach       Dental   Pulmonary  Trach collar s/p VDRF, remains unresponsive s/p IC hemorrhage   breath sounds clear to auscultation       Cardiovascular hypertension,  Rhythm:Regular Rate:Tachycardia  1/18 ECHO: EF 55-60%, valves OK   Neuro/Psych Remains unresponsive s/p intraventricular bleed    GI/Hepatic negative GI ROS, Neg liver ROS,   Endo/Other  negative endocrine ROS  Renal/GU negative Renal ROS     Musculoskeletal   Abdominal   Peds  Hematology negative hematology ROS (+)   Anesthesia Other Findings   Reproductive/Obstetrics                            Anesthesia Physical Anesthesia Plan  ASA: IV  Anesthesia Plan: General   Post-op Pain Management:    Induction:   Airway Management Planned: Tracheostomy  Additional Equipment: Arterial line  Intra-op Plan:   Post-operative Plan: Post-operative intubation/ventilation  Informed Consent:   Plan Discussed with:   Anesthesia Plan Comments: (Called emergently for pt extremely HTN, tachycardic, apneic with O2 sat 60's. Trach suctioned and Ambu bag vent via trach with immed improvement in sat to 100%. Dr. Yetta BarreJones called STAT and intraventric drain opened to drain.  Dr. Yetta BarreJones postponing surgery pending CT scan eval   Anne Craze Ajani Schnieders, MD)        Anesthesia Quick Evaluation

## 2016-10-31 NOTE — Progress Notes (Signed)
PULMONARY / CRITICAL CARE MEDICINE   Name: Anne JerichoViviana Easterly MRN: [id number] DOB: 1976-08-06    ADMISSION DATE:  10/04/2016 CONSULTATION DATE: 10/04/2016  REFERRING MD:  Dr. Rubin PayorPickering   CHIEF COMPLAINT:  Acute ICH   Brief: 41 y.o.female PMH of HTN who [facility] for headache, weakness, and AMS. CT head revealed an acute intraparenchymal hemorrhage in the right thalamus with a hematoma measuring 6.9 mL   SUBJECTIVE:  EVD remains in place, plan for OR today at 1430 for shunt placement.   VITAL SIGNS: BP 111/83   Pulse (!) 53   Temp 97.3 F (36.3 C) (Axillary)   Resp (!) 21   Ht 5\' 1"  (1.549 m)   Wt 66.9 kg (147 lb 7.8 oz)   LMP  (LMP Unknown)   SpO2 98%   BMI 27.87 kg/m   HEMODYNAMICS:    VENTILATOR SETTINGS: FiO2 (%):  [28 %] 28 %  INTAKE / OUTPUT: I/O last 3 completed shifts: In: 1575 [I.V.:360; Other:100; NG/GT:1115] Out: 1753 [Urine:1450; Drains:303]  PHYSICAL EXAMINATION: General:  Adult female, no distress, lying in bed  Neuro: withdrawals on RUE/RLE, forward gaze, pupils intact  HEENT:  Trach in place, EVD with clear output  Cardiovascular:  RRR, no MRG, NI S1/S2 Lungs:  Clear, diminished breath sounds, non-labored  Abdomen:  Non-tender, non-distended, active bowel sounds  Musculoskeletal:  No acute  Skin:  Warm, dry, intact   LABS:  BMET  Recent Labs Lab 10/27/16 0316 10/28/16 0254 10/30/16 0311  NA 144 143 143  K 4.3 3.7 4.1  CL 107 107 107  CO2 27 29 29   BUN 44* 39* 33*  CREATININE 0.98 1.00 0.84  GLUCOSE 103* 124* 96    Electrolytes  Recent Labs Lab 10/26/16 0806 10/27/16 0316 10/28/16 0254 10/30/16 0311  CALCIUM 9.0 9.4 9.2 9.4  MG 2.2 2.2 2.3  --   PHOS 4.0 4.0 3.7  --     CBC  Recent Labs Lab 10/27/16 0316 10/28/16 0254 10/30/16 0311  WBC 11.0* 8.3 7.3  HGB 14.7 14.5 13.6  HCT 46.6* 46.8* 43.7  PLT 273 277 239    Coag's No results for input(s): APTT, INR in the last 168 hours.  Sepsis  Markers No results for input(s): LATICACIDVEN, PROCALCITON, O2SATVEN in the last 168 hours.  ABG No results for input(s): PHART, PCO2ART, PO2ART in the last 168 hours.  Liver Enzymes  Recent Labs Lab 10/28/16 0254  ALBUMIN 2.7*    Cardiac Enzymes No results for input(s): TROPONINI, PROBNP in the last 168 hours.  Glucose  Recent Labs Lab 10/30/16 1131 10/30/16 1549 10/30/16 1911 10/30/16 2328 10/31/16 0332 10/31/16 0731  GLUCAP 115* 88 98 131* 111* 117*    Imaging Ct Head Wo Contrast  Result Date: 10/30/2016 CLINICAL DATA:  Intra cerebral hemorrhage.  Hydrocephalus. EXAM: CT HEAD WITHOUT CONTRAST TECHNIQUE: Contiguous axial images were obtained from the base of the skull through the vertex without intravenous contrast. COMPARISON:  CT head 10/24/2016, 10/04/2017 FINDINGS: Brain: Progressive hydrocephalus. Biventricular distance now measures 44.5 mm compared with 42.3 mm previously. Third and lateral ventricles are dilated. Fourth ventricle is small due to obstructive hydrocephalus. Right frontal ventricular drain remains in good position in the right frontal horn. Prior hemorrhage in the midbrain and thalamus on the right is now low-density. This extends into the right middle cerebellar peduncle. No acute hemorrhage. No subdural fluid collection. Periventricular white matter hypodensity is progressed likely due to transependymal absorption of CSF. No acute infarct.  Vascular: No hyperdense vessel or unexpected calcification.Negative Skull: Negative Sinuses/Orbits: Negative Other: None IMPRESSION: Progressive obstructive hydrocephalus compared with the prior CT of 10/24/2016. Right ventricular drain remains in good position. Progression of transependymal resorption of CSF in the cerebral white matter. 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 No acute high-density hemorrhage. Electronically Signed   By: Marlan Palau  M.D.   On: 10/30/2016 20:48     IMAGING/STUDIES: CT HEAD CODE STROKE 1/25 >Acute intraparenchymal hemorrhage in the right thalamus with hematoma measuring 6.9 mL. Intraventricular penetration. Small amount of subarachnoid penetration. CTA of the neck and head 1/25 >do not show any atherosclerotic narrowing. The vessels are somewhat tortuous suggesting hypertension. No sign of aneurysm or vascular malformation to explain the right thalamic hemorrhage. CT HEAD 1/26 >decreased volume of right thalamic hemorrhage, intraventricular and subarachnoid hemorrhage, progressive hydrocephalus EEG 1/31>This EEG is consistent with a focal area of cerebral dysfunction in the right hemisphere superimposed on a generalized nonspecific cerebral dysfunction as can be seen with medication effect among other causes. ECHO>55-60%  MICROBIOLOGY: MRSA PCR 1/25: Negative Blood Cultures x2 2/1: Negative Urine Culture 2/8: Klebsiella Blood Cultures x2 2/8: Negative Tracheal Aspirate Culture 2/8: Negative  CSF Culture 12/12:  Negative CSF Culture 12/18 >>>  ANTIBIOTICS: Unasyn 1/26 - 2/1 Rocephin 2/8 - 2/14  SIGNIFICANT EVENTS: 1/25 - Admit with AMS, intubated  1/26 - IVC drain placed  1/31 - PEG/Trach 2/04 - 24/7 ATC  LINES/TUBES: OETT 7.5 1/24 >1/31 Perc trach (DF) 1/31> PEG 1/31 >   DISCUSSION: 41 y.o. female with known hypertension admitted with acute intraparenchymal hemorrhage in the right thalamus with intraventricular extension resulting in hydrocephalus and the requirement for intraventricular drain placement. Patient underwent tracheostomy and has successfully weaned from ventilator support. Blood pressure is currently controlled  ASSESSMENT / PLAN:  PULMONARY A: Acute Hypoxic respiratory failure - resolving s/p tracheostomy  P:   Continue TC as tolerated  Maintain Saturation >92 Trach care per protocol  Duoneb PRN PRN suction   CARDIOVASCULAR A:  HTN P:  Normotensive  per NS Labetalol PRN  RENAL A:   Urinary Retention  P:   Trend BMP Replace electrolytes as needed  Free water 100 q8h  GASTROINTESTINAL A:   Dysphagia s/p gtube P:   Continue TF  PPI  HEMATOLOGIC A:   No issues  P:  Trend CBC  INFECTIOUS A:   Klebsiella UTI  -Treated with Rocephin ended on 2/14 P:   Trend Fever and WBC curve   ENDOCRINE A:   No issues  P:   Trend Glucose on CBC  NEUROLOGIC A:   Acute ICH with obstructive hydrocephalus s/p IVD  P:   Per NeuroSurg Planned VP Shunt today  RASS goal: 0    FAMILY  - Updates: No family at bedside 2/21  - Inter-disciplinary family meet or Palliative Care meeting due by:  Ongoing     Jovita Kussmaul, AG-ACNP [facility]  Pgr: [phone]  PCCM Pgr: [phone]

## 2016-10-31 NOTE — Progress Notes (Signed)
Between 1900 and 2100, pt O2 sats decreased to as low as 89% 3 separate times - suctioned when necessary for each. After 3rd episode - notified RRT. RRT assessed and adjusted level/amount of O2 being given to pt on trach collar in attempt to maintain sats at appropriate percentage.  Will continue to monitor closely and notify RRT/MD when necessary.  Francia GreavesSavannah R Samael Blades, RN

## 2016-10-31 NOTE — Progress Notes (Signed)
Patient ID: Anne JerichoViviana Olsen, female   DOB: 26-Dec-1975, 41 y.o.   MRN: [id number] Patient was brought down to the operating room by nursing and anesthesia with ventriculostomy clamped. I did not have knowledge of this. I was called emergently out of the operating room or I was doing spine surgery because the patient was in short stay with hypotension, she then became very hypertensive and tachycardic. They did open her drain and drained 20 mL of spinal fluid before my arrival. I lowered her drain down to 10 cm. CT scan has been done and shows ventriculomegaly. Leave the ventriculostomy at 10 cm. She is obviously very sensitive to any decrease in drainage. I have spoken to her husband and her mother at length. If she remains stable we will try to schedule her surgery once again for 2 days from now. We will not clamp the ventriculostomy.

## 2016-10-31 NOTE — Progress Notes (Signed)
RT arrived and pateint was not spontaneously breathing. Placed on vent for arround 10 minutes until she began to breath again. Taken back on vent per CRNA and placed on ATC.

## 2016-10-31 NOTE — Progress Notes (Signed)
Nutrition Follow-up  INTERVENTION:   D/C Nepro  Initiate Jevity 1.2 @ 55 ml/hr (1320 ml/day) 30 ml Prostat daily Provides: 1684 kcal, 88 grams protein, and 1069 ml free water  100 ml free water every 8 hours  Total free water: 1369 ml   NUTRITION DIAGNOSIS:   Inadequate oral intake related to inability to eat as evidenced by NPO statusOngoing.   GOAL:   Patient will meet greater than or equal to 90% of their needs Met.   MONITOR:   TF tolerance, Vent status, I & O's  ASSESSMENT:   Pt with PMH of HTN admitted with R intraparenchymal brain hemorrhage.   Pt discussed during ICU rounds and with RN.  Remains on trach collar Having shunt placed Friday after failed attempt 2/21  TF changed 2/12 due to elevated potassium and phosphorus. Potassium and Phosphorus have normalized and have remained normal since 2/15 and 2/16 respectively.  Will trial standard formula.   Diet Order:  Diet NPO time specified  Skin:  Reviewed, no issues  Last BM:  2/21  Height:   Ht Readings from Last 1 Encounters:  10/04/16 '5\' 1"'$  (1.549 m)    Weight:   Wt Readings from Last 1 Encounters:  11/01/16 143 lb 8.3 oz (65.1 kg)    Ideal Body Weight:  47.7 kg  BMI:  Body mass index is 27.12 kg/m.  Estimated Nutritional Needs:   Kcal:  1600-1800  Protein:  85-100 grams  Fluid:  > 1.6 L/day  EDUCATION NEEDS:   No education needs identified at this time  [place], [place], [place] Pager [phone] After Hours Pager

## 2016-10-31 NOTE — Progress Notes (Signed)
Pt arrived to floor with trach secretions, deep suctioning provided, sats 96%, tolerated well.  Pt became tachycardic with HR up to 170, O2 sats 99%.  BP 202/140.  Called Anesthesia MD.  Pt HR trended down to 130s and O2 declined to 38%.  Trach suctioning and respiratory assistance via Ambu bag provided.  RT called for vent.  Care turned over to MD and CRNA.

## 2016-10-31 NOTE — Progress Notes (Signed)
Patient ID: Anne JerichoViviana Halderman, female   DOB: Jan 19, 1976, 41 y.o.   MRN: [id number] No change in PE, EVD draining even at 20 cm, plan VPS today

## 2016-11-01 ENCOUNTER — Inpatient Hospital Stay (HOSPITAL_COMMUNITY): Payer: 59

## 2016-11-01 LAB — CBC
HEMATOCRIT: 44.2 % (ref 36.0–46.0)
Hemoglobin: 14.4 g/dL (ref 12.0–15.0)
MCH: 29.7 pg (ref 26.0–34.0)
MCHC: 32.6 g/dL (ref 30.0–36.0)
MCV: 91.1 fL (ref 78.0–100.0)
Platelets: 254 10*3/uL (ref 150–400)
RBC: 4.85 MIL/uL (ref 3.87–5.11)
RDW: 13 % (ref 11.5–15.5)
WBC: 15.2 10*3/uL — AB (ref 4.0–10.5)

## 2016-11-01 LAB — BASIC METABOLIC PANEL
Anion gap: 10 (ref 5–15)
BUN: 25 mg/dL — AB (ref 6–20)
CHLORIDE: 107 mmol/L (ref 101–111)
CO2: 24 mmol/L (ref 22–32)
Calcium: 9.5 mg/dL (ref 8.9–10.3)
Creatinine, Ser: 0.85 mg/dL (ref 0.44–1.00)
GFR calc Af Amer: >60 mL/min (ref >60)
GFR calc non Af Amer: >60 mL/min (ref >60)
GLUCOSE: 120 mg/dL — AB (ref 65–99)
POTASSIUM: 4.6 mmol/L (ref 3.5–5.1)
Sodium: 141 mmol/L (ref 135–145)

## 2016-11-01 LAB — POCT I-STAT 3, ART BLOOD GAS (G3+)
ACID-BASE EXCESS: 3 mmol/L — AB (ref 0.0–2.0)
BICARBONATE: 26.1 mmol/L (ref 20.0–28.0)
O2 Saturation: 97 %
PH ART: 7.483 — AB (ref 7.350–7.450)
PO2 ART: 86 mmHg (ref 83.0–108.0)
TCO2: 27 mmol/L (ref 0–100)
pCO2 arterial: 35 mmHg (ref 32.0–48.0)

## 2016-11-01 LAB — GLUCOSE, CAPILLARY
GLUCOSE-CAPILLARY: 165 mg/dL — AB (ref 65–99)
GLUCOSE-CAPILLARY: 128 mg/dL — AB (ref 65–99)
GLUCOSE-CAPILLARY: 115 mg/dL — AB (ref 65–99)
GLUCOSE-CAPILLARY: 100 mg/dL — AB (ref 65–99)
Glucose-Capillary: 156 mg/dL — ABNORMAL HIGH (ref 65–99)
Glucose-Capillary: 132 mg/dL — ABNORMAL HIGH (ref 65–99)

## 2016-11-01 LAB — PHOSPHORUS: Phosphorus: 3.4 mg/dL (ref 2.5–4.6)

## 2016-11-01 LAB — CSF CELL COUNT WITH DIFFERENTIAL
RBC Count, CSF: 28 /mm3 — ABNORMAL HIGH
WBC CSF: 1 /mm3 (ref 0–5)

## 2016-11-01 LAB — PROTEIN AND GLUCOSE, CSF
Glucose, CSF: 82 mg/dL — ABNORMAL HIGH (ref 40–70)
Total  Protein, CSF: 8 mg/dL — ABNORMAL LOW (ref 15–45)

## 2016-11-01 LAB — MAGNESIUM: Magnesium: 1.9 mg/dL (ref 1.7–2.4)

## 2016-11-01 MED ORDER — JEVITY 1.2 CAL PO LIQD
1000.0000 mL | ORAL | Status: DC
  Administered 2016-11-01 – 2016-11-08 (×6): 1000 mL
  Administered 2016-11-09: 55 mL/h
  Administered 2016-11-09: 04:00:00
  Administered 2016-11-10 – 2016-11-11 (×2): 1000 mL
  Filled 2016-11-01 (×18): qty 1000

## 2016-11-01 MED ORDER — MAGNESIUM SULFATE 2 GM/50ML IV SOLN
2.0000 g | Freq: Once | INTRAVENOUS | Status: AC
  Administered 2016-11-01: 2 g via INTRAVENOUS
  Filled 2016-11-01: qty 50

## 2016-11-01 MED ORDER — PRO-STAT SUGAR FREE PO LIQD
30.0000 mL | Freq: Every day | ORAL | Status: DC
  Administered 2016-11-03 – 2016-11-12 (×10): 30 mL
  Filled 2016-11-01 (×10): qty 30

## 2016-11-01 NOTE — Progress Notes (Signed)
Unable to place VP shunt on 2/21 as pt did not tolerate clamping of EVD.  Since then she has been hypotensive and tachypnic, per RN.  She has been rescheduled for placement of shunt tomorrow, 2/22.  Will follow progress.    Quintella BatonJulie W. Claus Silvestro, RN, BSN  Trauma/Neuro ICU Case Manager [phone]

## 2016-11-01 NOTE — Progress Notes (Signed)
OT Cancellation Note  Patient Details Name: Anne JerichoViviana Olsen MRN: [id number] DOB: 05/18/1976   Cancelled Treatment:    Reason Eval/Treat Not Completed: Patient not medically ready. Spoke with RN and pt getting ABGs and a Chest x-ray due to tachypnea.  Will f/u another time  Evette GeorgesLeonard, Theresea Trautmann Eva [phone] 11/01/2016, 12:18 PM

## 2016-11-01 NOTE — Progress Notes (Signed)
Patient ID: Anne Olsen, female   DOB: Jun 12, 1976, 41 y.o.   MRN: [id number] Patient seen and examined. Ventriculostomy patent at 10 cm. CSF sent for studies and looks clear. His goal exam unchanged. She is tachypneic and her blood pressure is about 80/60. CCM is aware. We have her back on the schedule for shunt placement tomorrow. I have talked with her mother and her husband again today. They fully understand the prognosis, and her husband states "just do your best and we will accept any outcome."

## 2016-11-01 NOTE — Progress Notes (Signed)
PT Cancellation Note  Patient Details Name: Ricardo JerichoViviana Hosie MRN: [id number] DOB: 03-01-1976   Cancelled Treatment:    Reason Eval/Treat Not Completed: Medical issues which prohibited therapy.  Spoke with RN and pt getting ABGs and a Chest x-ray due to tachypnea.  Will f/u another time.     Aundra MilletMegan F Orhan Mayorga 11/01/2016, 12:00 PM

## 2016-11-01 NOTE — Progress Notes (Signed)
Notified CCM via eLink concerning pt's increased respiratory rate - reached as high as 38 breaths per minute. Upon MD's returned phone call - pt's RR was between 20-25 breaths per minute after being suctioned. MD informed to continue monitoring and call back if needed.  Will continue to monitor closely.  Francia GreavesSavannah R Yanis Larin, RN

## 2016-11-01 NOTE — Progress Notes (Signed)
Informed CCM via eLink of pt's RR - as high as 46 breaths per minute this occurrence.   MD stated that this is likely neurogenic and there are no further interventions/orders to be given at this time.  Will continue to monitor and will notify if pt's O2 sats begin to decrease with the tachypnea.   Francia GreavesSavannah R Duwan Adrian, RN

## 2016-11-01 NOTE — Progress Notes (Signed)
PULMONARY / CRITICAL CARE MEDICINE   Name: Anne Olsen MRN: [id number] DOB: [birth year]    ADMISSION DATE:  10/04/2016 CONSULTATION DATE: 10/04/2016  REFERRING MD:  Dr. Rubin Payor   CHIEF COMPLAINT:  Acute ICH   Brief: 41 y.o.female PMH of HTN who presented 1/25 to [facility] via EMS for headache, weakness, and AMS. CT head revealed an acute intraparenchymal hemorrhage in the right thalamus with a hematoma measuring 6.9 mL  IMAGING/STUDIES: CT HEAD CODE STROKE 1/25 >Acute intraparenchymal hemorrhage in the right thalamus with hematoma measuring 6.9 mL. Intraventricular penetration. Small amount of subarachnoid penetration. CTA of the neck and head 1/25 >do not show any atherosclerotic narrowing. The vessels are somewhat tortuous suggesting hypertension. No sign of aneurysm or vascular malformation to explain the right thalamic hemorrhage. CT HEAD 1/26 >decreased volume of right thalamic hemorrhage, intraventricular and subarachnoid hemorrhage, progressive hydrocephalus EEG 1/31>This EEG is consistent with a focal area of cerebral dysfunction in the right hemisphere superimposed on a generalized nonspecific cerebral dysfunction as can be seen with medication effect among other causes. ECHO>55-60%  MICROBIOLOGY: MRSA PCR 1/25: Negative Blood Cultures x2 2/1: Negative Urine Culture 2/8: Klebsiella Blood Cultures x2 2/8: Negative Tracheal Aspirate Culture 2/8: Negative  CSF Culture 12/12:  Negative CSF Culture 12/18 >>>  ANTIBIOTICS: Unasyn 1/26 - 2/1 Rocephin 2/8 - 2/14  LINES/TUBES: OETT 7.5 1/24 >1/31 Perc trach (DF) 1/31> PEG 1/31 >  SIGNIFICANT EVENTS: 1/25 - Admit with AMS, intubated  1/26 - IVC drain placed  1/31 - PEG/Tra 2/04 - 24/7 ATC 2/21 - EVD remains in place, plan for OR today at 1430 for shunt placement.    SUBJECTIVE/OVERNIGHT/INTERVAL HX 2/22 - went for shunt placement yesterday but preop EVD was clamped and she tolerated that very poorly . So  shunt placement postponed and since then still on ATC but tachypneic at RR 30   VITAL SIGNS: BP (!) 85/72   Pulse 75   Temp 99.7 F (37.6 C) (Axillary) Comment: rn notify  Resp (!) 34   Ht 5\' 1"  (1.549 m)   Wt 65.1 kg (143 lb 8.3 oz)   LMP  (LMP Unknown)   SpO2 93%   BMI 27.12 kg/m   HEMODYNAMICS:    VENTILATOR SETTINGS: Vent Mode: PRVC FiO2 (%):  [28 %-100 %] 40 % Set Rate:  [14 bmp] 14 bmp Vt Set:  [500 mL] 500 mL PEEP:  [5 cmH20] 5 cmH20 Plateau Pressure:  [18 cmH20] 18 cmH20  INTAKE / OUTPUT: I/O last 3 completed shifts: In: 1368.4 [I.V.:330; Other:230; NG/GT:808.4] Out: 1993 [Urine:1675; Drains:318]  PHYSICAL EXAMINATION:  General Appearance:    Looks chronic criticall ill OBESE - no  Head:    Normocephalic, without obvious abnormality, atraumatic  Eyes:    , conjunctiva/corneas - clear      Ears:    Normal external ear canals, both ears  Nose:   NG tube - no  Throat:   tRACH +,   Neck:   Supple,  No enlargement/tenderness/nodules     Lungs:     Clear to auscultation bilaterally, RR 30 , shallow and not paradoxical  Chest wall:    No deformity  Heart:    S1 and S2 normal, no murmur, CVP - na.  Pressors - no  Abdomen:     Soft, no masses, no organomegaly  Genitalia:    Not done  Rectal:   not done  Extremities:   Extremities- no cyanosis     Skin:   Intact in exposed areas .  Sacral area - none per Rn     Neurologic:   Sedation - none -> RASS - -3  equivlaent       LABS:  BMET  Recent Labs Lab 10/28/16 0254 10/30/16 0311 11/01/16 0339  NA 143 143 141  K 3.7 4.1 4.6  CL 107 107 107  CO2 29 29 24   BUN 39* 33* 25*  CREATININE 1.00 0.84 0.85  GLUCOSE 124* 96 120*    Electrolytes  Recent Labs Lab 10/27/16 0316 10/28/16 0254 10/30/16 0311 11/01/16 0339  CALCIUM 9.4 9.2 9.4 9.5  MG 2.2 2.3  --  1.9  PHOS 4.0 3.7  --  3.4    CBC  Recent Labs Lab 10/28/16 0254 10/30/16 0311 11/01/16 0339  WBC 8.3 7.3 15.2*  HGB 14.5 13.6 14.4   HCT 46.8* 43.7 44.2  PLT 277 239 254    Coag's No results for input(s): APTT, INR in the last 168 hours.  Sepsis Markers No results for input(s): LATICACIDVEN, PROCALCITON, O2SATVEN in the last 168 hours.  ABG No results for input(s): PHART, PCO2ART, PO2ART in the last 168 hours.  Liver Enzymes  Recent Labs Lab 10/28/16 0254  ALBUMIN 2.7*    Cardiac Enzymes No results for input(s): TROPONINI, PROBNP in the last 168 hours.  Glucose  Recent Labs Lab 10/31/16 0731 10/31/16 1145 10/31/16 1926 10/31/16 2342 11/01/16 0303 11/01/16 0858  GLUCAP 117* 136* 123* 142* 165* 156*    Imaging Ct Head Wo Contrast  Addendum Date: 10/31/2016   ADDENDUM REPORT: 10/31/2016 17:06 ADDENDUM: Case was discussed on 10/31/2016  at 5:05 pm with Dr. Marikay AlarAVID JONES. Electronically Signed   By: Marnee SpringJonathon  Watts M.D.   On: 10/31/2016 17:06   Result Date: 10/31/2016 CLINICAL DATA:  Altered mental status and hypertension EXAM: CT HEAD WITHOUT CONTRAST TECHNIQUE: Contiguous axial images were obtained from the base of the skull through the vertex without intravenous contrast. COMPARISON:  Yesterday FINDINGS: Brain: Marked dilatation of the lateral and third ventricles, unchanged from yesterday. There is transependymal CSF re- absorption and presumed flow along the right frontal ventriculostomy. Prior to the patient's CT she experienced apnea after EVD clamping in OR holding. Case discussed with ICU RN Tammy and findings are known to Dr Yetta BarreJones, the EVD is working, and patient has returned to pre clamping baseline. The EVD is in stable position, tip at the anterior septum pellucidum after traversing the frontal horn right lateral ventricle. Subacute low-density hematoma in the midbrain with fourth ventricular effacement. Vascular: No hyperdense vessel or unexpected calcification. Skull: Negative Sinuses/Orbits: Negative IMPRESSION: 1. 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 Degree is unchanged from yesterday. EVD positioning is stable. 2. Subacute hematoma in the midbrain. Electronically Signed: By: Marnee SpringJonathon  Watts M.D. On: 10/31/2016 16:56     DISCUSSION: 41 y.o. female with known hypertension admitted with acute intraparenchymal hemorrhage in the right thalamus with intraventricular extension resulting in hydrocephalus and the requirement for intraventricular drain placement. Patient underwent tracheostomy and has successfully weaned from ventilator support. Blood pressure is currently controlled  ASSESSMENT / PLAN:  PULMONARY A: Acute on chronic Hypoxic respiratory failure -  s/p tracheostomy     - 2/22 - tachypenic after EVD was clamped  P:   Check ABG and CXR given tachypnea 11/01/2016 Continue TC as tolerated  Maintain Saturation >92 Trach care per protocol  Duoneb PRN PRN suction   CARDIOVASCULAR A:  HTN P:  Normotensive per NS Labetalol  PRN  RENAL A:   Urinary Retention    mild low mag 11/01/2016  P:   Replete mag 11/01/2016 Trend BMP Replace electrolytes as needed  Free water 100 q8h  GASTROINTESTINAL A:   Dysphagia s/p gtube P:   Continue TF  PPI  HEMATOLOGIC A:   No issues  P:  Trend CBC  INFECTIOUS A:   Klebsiella UTI  -Treated with Rocephin ended on 2/14 P:   Trend Fever and WBC curve   ENDOCRINE A:   No issues  P:   Trend Glucose on CBC  NEUROLOGIC A:   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 Kalman Shan, M.D., [facility].C.P Pulmonary and Critical Care Medicine Staff Physician [facility]  Pulmonary and Critical Care Pager: [phone], If no answer or between  15:00h - 7:00h: call [phone]  11/01/2016 11:14 AM

## 2016-11-02 ENCOUNTER — Encounter (HOSPITAL_COMMUNITY)
Admission: EM | Disposition: A | Discharge status: Skilled Nursing Facility | Payer: Self-pay | Source: Home / Self Care | Attending: Internal Medicine

## 2016-11-02 ENCOUNTER — Encounter (HOSPITAL_COMMUNITY): Payer: Self-pay | Admitting: Anesthesiology

## 2016-11-02 ENCOUNTER — Inpatient Hospital Stay (HOSPITAL_COMMUNITY): Payer: 59 | Admitting: Certified Registered"

## 2016-11-02 ENCOUNTER — Inpatient Hospital Stay (HOSPITAL_COMMUNITY): Payer: 59

## 2016-11-02 DIAGNOSIS — Z982 Presence of cerebrospinal fluid drainage device: Secondary | ICD-10-CM

## 2016-11-02 HISTORY — PX: VENTRICULOPERITONEAL SHUNT: SHX204

## 2016-11-02 LAB — GLUCOSE, CAPILLARY
GLUCOSE-CAPILLARY: 124 mg/dL — AB (ref 65–99)
GLUCOSE-CAPILLARY: 122 mg/dL — AB (ref 65–99)
GLUCOSE-CAPILLARY: 67 mg/dL (ref 65–99)
Glucose-Capillary: 125 mg/dL — ABNORMAL HIGH (ref 65–99)

## 2016-11-02 SURGERY — SHUNT INSERTION VENTRICULAR-PERITONEAL
Anesthesia: General

## 2016-11-02 MED ORDER — PHENYLEPHRINE HCL 10 MG/ML IJ SOLN
INTRAVENOUS | Status: DC | PRN
  Administered 2016-11-02: 25 ug/min via INTRAVENOUS

## 2016-11-02 MED ORDER — PROPOFOL 10 MG/ML IV BOLUS
INTRAVENOUS | Status: AC
  Filled 2016-11-02: qty 20

## 2016-11-02 MED ORDER — BACITRACIN ZINC 500 UNIT/GM EX OINT
TOPICAL_OINTMENT | CUTANEOUS | Status: DC | PRN
  Administered 2016-11-02 (×2): 1 via TOPICAL

## 2016-11-02 MED ORDER — PHENYLEPHRINE HCL 10 MG/ML IJ SOLN
INTRAMUSCULAR | Status: DC | PRN
Start: 2016-11-02 — End: 2016-11-02
  Administered 2016-11-02 (×2): 80 ug via INTRAVENOUS

## 2016-11-02 MED ORDER — ONDANSETRON HCL 4 MG PO TABS: 4.0000 mg | ORAL_TABLET | ORAL | Status: DC | PRN

## 2016-11-02 MED ORDER — LIDOCAINE-EPINEPHRINE (PF) 2 %-1:200000 IJ SOLN
INTRAMUSCULAR | Status: AC
  Filled 2016-11-02: qty 20

## 2016-11-02 MED ORDER — DEXTROSE 50 % IV SOLN
25.0000 mL | Freq: Once | INTRAVENOUS | Status: AC
  Administered 2016-11-02: 25 mL via INTRAVENOUS

## 2016-11-02 MED ORDER — HEMOSTATIC AGENTS (NO CHARGE) OPTIME
TOPICAL | Status: DC | PRN
  Administered 2016-11-02: 1 via TOPICAL

## 2016-11-02 MED ORDER — DIPHENHYDRAMINE HCL 50 MG/ML IJ SOLN
INTRAMUSCULAR | Status: AC
  Filled 2016-11-02: qty 1

## 2016-11-02 MED ORDER — THROMBIN 5000 UNITS EX SOLR
CUTANEOUS | Status: AC
  Filled 2016-11-02: qty 15000

## 2016-11-02 MED ORDER — POTASSIUM CHLORIDE IN NACL 20-0.9 MEQ/L-% IV SOLN
INTRAVENOUS | Status: DC
  Administered 2016-11-02 – 2016-11-03 (×2): via INTRAVENOUS
  Filled 2016-11-02 (×2): qty 1000

## 2016-11-02 MED ORDER — CEFAZOLIN IN D5W 1 GM/50ML IV SOLN
1.0000 g | Freq: Three times a day (TID) | INTRAVENOUS | Status: AC
  Administered 2016-11-02 – 2016-11-03 (×2): 1 g via INTRAVENOUS
  Filled 2016-11-02 (×2): qty 50

## 2016-11-02 MED ORDER — SODIUM CHLORIDE 0.9 % IR SOLN
Status: DC | PRN
  Administered 2016-11-02: 17:00:00

## 2016-11-02 MED ORDER — FENTANYL CITRATE (PF) 100 MCG/2ML IJ SOLN
INTRAMUSCULAR | Status: DC | PRN
  Administered 2016-11-02 (×2): 50 ug via INTRAVENOUS

## 2016-11-02 MED ORDER — DEXTROSE 50 % IV SOLN
INTRAVENOUS | Status: AC
  Filled 2016-11-02: qty 50

## 2016-11-02 MED ORDER — PROPOFOL 10 MG/ML IV BOLUS
INTRAVENOUS | Status: DC | PRN
  Administered 2016-11-02 (×2): 20 mg via INTRAVENOUS

## 2016-11-02 MED ORDER — ACETAMINOPHEN 650 MG RE SUPP
650.0000 mg | RECTAL | Status: DC | PRN
  Administered 2016-11-03 – 2016-11-04 (×2): 650 mg via RECTAL
  Filled 2016-11-02 (×2): qty 1

## 2016-11-02 MED ORDER — ACETAMINOPHEN 325 MG PO TABS
650.0000 mg | ORAL_TABLET | ORAL | Status: DC | PRN
  Administered 2016-11-02 – 2016-11-15 (×4): 650 mg via ORAL
  Filled 2016-11-02 (×3): qty 2

## 2016-11-02 MED ORDER — ONDANSETRON HCL 4 MG/2ML IJ SOLN
4.0000 mg | INTRAMUSCULAR | Status: DC | PRN
Start: 2016-11-02 — End: 2016-11-03

## 2016-11-02 MED ORDER — ROCURONIUM BROMIDE 100 MG/10ML IV SOLN
INTRAVENOUS | Status: DC | PRN
  Administered 2016-11-02: 50 mg via INTRAVENOUS

## 2016-11-02 MED ORDER — LIDOCAINE-EPINEPHRINE 1 %-1:100000 IJ SOLN
INTRAMUSCULAR | Status: DC | PRN
  Administered 2016-11-02: 8 mL

## 2016-11-02 MED ORDER — VANCOMYCIN HCL IN DEXTROSE 1-5 GM/200ML-% IV SOLN
1000.0000 mg | Freq: Once | INTRAVENOUS | Status: AC
  Administered 2016-11-02: 1000 mg via INTRAVENOUS
  Filled 2016-11-02: qty 200

## 2016-11-02 MED ORDER — ONDANSETRON HCL 4 MG/2ML IJ SOLN
INTRAMUSCULAR | Status: DC | PRN
Start: 2016-11-02 — End: 2016-11-02
  Administered 2016-11-02: 4 mg via INTRAVENOUS

## 2016-11-02 MED ORDER — DEXTROSE 5 % IV SOLN
1.0000 g | Freq: Once | INTRAVENOUS | Status: AC
  Administered 2016-11-02: 1 g via INTRAVENOUS
  Filled 2016-11-02: qty 10

## 2016-11-02 MED ORDER — 0.9 % SODIUM CHLORIDE (POUR BTL) OPTIME
TOPICAL | Status: DC | PRN
  Administered 2016-11-02: 1000 mL

## 2016-11-02 MED ORDER — THROMBIN 5000 UNITS EX SOLR
CUTANEOUS | Status: DC | PRN
  Administered 2016-11-02 (×2): 5000 [IU] via TOPICAL

## 2016-11-02 MED ORDER — DIPHENHYDRAMINE HCL 50 MG/ML IJ SOLN
25.0000 mg | Freq: Once | INTRAMUSCULAR | Status: AC
Start: 2016-11-02 — End: 2016-11-02
  Administered 2016-11-02: 25 mg via INTRAVENOUS

## 2016-11-02 MED ORDER — FENTANYL CITRATE (PF) 100 MCG/2ML IJ SOLN
INTRAMUSCULAR | Status: AC
  Filled 2016-11-02: qty 2

## 2016-11-02 MED ORDER — BACITRACIN ZINC 500 UNIT/GM EX OINT
TOPICAL_OINTMENT | CUTANEOUS | Status: AC
  Filled 2016-11-02: qty 28.35

## 2016-11-02 SURGICAL SUPPLY — 63 items
BAG DECANTER FOR FLEXI CONT (MISCELLANEOUS) ×3 IMPLANT
BENZOIN TINCTURE PRP APPL 2/3 (GAUZE/BANDAGES/DRESSINGS) ×6 IMPLANT
BLADE CLIPPER SPEC (BLADE) ×3 IMPLANT
BOOT SUTURE AID YELLOW STND (SUTURE) ×3 IMPLANT
BUR ACORN 6.0 PRECISION (BURR) ×2 IMPLANT
BUR ACORN 6.0MM PRECISION (BURR) ×1
CANISTER SUCT 3000ML PPV (MISCELLANEOUS) ×3 IMPLANT
CARTRIDGE OIL MAESTRO DRILL (MISCELLANEOUS) ×1 IMPLANT
CLIP RANEY DISP (INSTRUMENTS) IMPLANT
CLOSURE WOUND 1/2 X4 (GAUZE/BANDAGES/DRESSINGS) ×3
DERMABOND ADVANCED (GAUZE/BANDAGES/DRESSINGS) ×2
DERMABOND ADVANCED .7 DNX12 (GAUZE/BANDAGES/DRESSINGS) ×1 IMPLANT
DIFFUSER DRILL AIR PNEUMATIC (MISCELLANEOUS) ×3 IMPLANT
DRAPE INCISE IOBAN 66X45 STRL (DRAPES) ×3 IMPLANT
DRAPE ORTHO SPLIT 77X108 STRL (DRAPES) ×4
DRAPE POUCH INSTRU U-SHP 10X18 (DRAPES) ×3 IMPLANT
DRAPE SURG 17X23 STRL (DRAPES) ×18 IMPLANT
DRAPE SURG ORHT 6 SPLT 77X108 (DRAPES) ×2 IMPLANT
DURAPREP 26ML APPLICATOR (WOUND CARE) ×9 IMPLANT
ELECT REM PT RETURN 9FT ADLT (ELECTROSURGICAL) ×3
ELECTRODE REM PT RTRN 9FT ADLT (ELECTROSURGICAL) ×1 IMPLANT
GAUZE SPONGE 4X4 12PLY STRL (GAUZE/BANDAGES/DRESSINGS) ×3 IMPLANT
GAUZE SPONGE 4X4 16PLY XRAY LF (GAUZE/BANDAGES/DRESSINGS) IMPLANT
GLOVE BIO SURGEON STRL SZ8 (GLOVE) ×3 IMPLANT
GOWN STRL REUS W/ TWL LRG LVL3 (GOWN DISPOSABLE) IMPLANT
GOWN STRL REUS W/ TWL XL LVL3 (GOWN DISPOSABLE) IMPLANT
GOWN STRL REUS W/TWL 2XL LVL3 (GOWN DISPOSABLE) ×3 IMPLANT
GOWN STRL REUS W/TWL LRG LVL3 (GOWN DISPOSABLE)
GOWN STRL REUS W/TWL XL LVL3 (GOWN DISPOSABLE)
KIT BASIN OR (CUSTOM PROCEDURE TRAY) ×3 IMPLANT
KIT ROOM TURNOVER OR (KITS) ×3 IMPLANT
MARKER SKIN DUAL TIP RULER LAB (MISCELLANEOUS) ×3 IMPLANT
NEEDLE HYPO 25X1 1.5 SAFETY (NEEDLE) ×3 IMPLANT
NS IRRIG 1000ML POUR BTL (IV SOLUTION) ×3 IMPLANT
OIL CARTRIDGE MAESTRO DRILL (MISCELLANEOUS) ×3
PACK LAMINECTOMY NEURO (CUSTOM PROCEDURE TRAY) ×3 IMPLANT
PAD ARMBOARD 7.5X6 YLW CONV (MISCELLANEOUS) ×9 IMPLANT
PATTIES SURGICAL .5 X.5 (GAUZE/BANDAGES/DRESSINGS) IMPLANT
PATTIES SURGICAL .5 X3 (DISPOSABLE) IMPLANT
PATTIES SURGICAL .75X.75 (GAUZE/BANDAGES/DRESSINGS) IMPLANT
RUBBERBAND STERILE (MISCELLANEOUS) IMPLANT
SHEATH PERITONEAL INTRO 46 (MISCELLANEOUS) IMPLANT
SHEATH PERITONEAL INTRO 61 (MISCELLANEOUS) ×3 IMPLANT
SPONGE LAP 4X18 X RAY DECT (DISPOSABLE) IMPLANT
SPONGE SURGIFOAM ABS GEL SZ50 (HEMOSTASIS) ×3 IMPLANT
STAPLER VISISTAT 35W (STAPLE) ×9 IMPLANT
STRIP CLOSURE SKIN 1/2X4 (GAUZE/BANDAGES/DRESSINGS) ×6 IMPLANT
SUT ETHILON 3 0 FSL (SUTURE) IMPLANT
SUT SILK 0 TIES 10X30 (SUTURE) IMPLANT
SUT SILK 2 0 TIES 17X18 (SUTURE) ×2
SUT SILK 2-0 18XBRD TIE BLK (SUTURE) ×1 IMPLANT
SUT SILK 3 0 SH 30 (SUTURE) IMPLANT
SUT VIC AB 2-0 CP2 18 (SUTURE) ×9 IMPLANT
SUT VIC AB 3-0 SH 8-18 (SUTURE) ×6 IMPLANT
SYR 5ML LL (SYRINGE) IMPLANT
SYR CONTROL 10ML LL (SYRINGE) ×6 IMPLANT
TAPE CLOTH SURG 4X10 WHT LF (GAUZE/BANDAGES/DRESSINGS) ×3 IMPLANT
TOWEL OR 17X24 6PK STRL BLUE (TOWEL DISPOSABLE) ×3 IMPLANT
TOWEL OR 17X26 10 PK STRL BLUE (TOWEL DISPOSABLE) ×3 IMPLANT
TRAY FOLEY W/METER SILVER 16FR (SET/KITS/TRAYS/PACK) IMPLANT
UNDERPAD 30X30 (UNDERPADS AND DIAPERS) ×3 IMPLANT
VALVE RT ANGLE UNITIZE DIST (Valve) ×3 IMPLANT
WATER STERILE IRR 1000ML POUR (IV SOLUTION) ×3 IMPLANT

## 2016-11-02 NOTE — Transfer of Care (Signed)
Immediate Anesthesia Transfer of Care Note  Patient: Anne JerichoViviana Olsen  Procedure(s) Performed: Procedure(s): SHUNT INSERTION VENTRICULAR-PERITONEAL (N/A)  Patient Location: NICU  Anesthesia Type:General  Level of Consciousness: Patient remains intubated per anesthesia plan  Airway & Oxygen Therapy: Patient remains intubated per anesthesia plan  Post-op Assessment: Report given to RN and Post -op Vital signs reviewed and stable  Post vital signs: Reviewed and stable  Last Vitals:  Vitals:   11/02/16 1400 11/02/16 1500  BP: (!) 81/51 (!) 86/56  Pulse: 68 69  Resp: 20 (!) 21  Temp:      Last Pain:  Vitals:   11/02/16 1154  TempSrc: Axillary         Complications: No apparent anesthesia complications

## 2016-11-02 NOTE — Op Note (Signed)
10/04/2016 - 11/02/2016  6:00 PM  PATIENT:  Ricardo JerichoViviana Bekker  41 y.o. female  PRE-OPERATIVE DIAGNOSIS:  Hydrocephalus  POST-OPERATIVE DIAGNOSIS:  same  PROCEDURE:  Right ventriculoperitoneal shunt placement with programmable valve set at 110 mm water  SURGEON:  Marikay Alaravid Zylee Marchiano, MD  ASSISTANTSAdelene Idler: Meyran NP  ANESTHESIA:   General  EBL: less than 50 ml  Total I/O In: 80 [I.V.:80] Out: 34 [Drains:34]  BLOOD ADMINISTERED: none  DRAINS: none  SPECIMEN:  none  INDICATION FOR PROCEDURE: This patient presented with a cerebral hemorrhage with hydrocephalus. Imaging showed hydrocephalus. Ventriculostomy drain was placed and she never could wean from the drain. Recommended ventriculoperitoneal shunt placement. Patient understood the risks, benefits, and alternatives and potential outcomes and wished to proceed.  PROCEDURE DETAILS: The patient was taken to the operating room and after induction of adequate generalized intratracheal anesthesia she was placed in the supine position with her head turned to the right. Ventriculostomy was left open at a height of 15. Her right parieto-occipital region was shaved and then the entire field was cleaned with alcohol. I then placed 1010 drapes around the surgical field. We then cleaned the surgical field with Betadine scrub and with DuraPrep. 5 mL of local anesthesia was injected in the right parieto-occipital region. An incision was made in the right upper quadrant and a hockey-stick incision was made in the right parietal region. A burr hole was created here with high-speed drill. Then used a shunt passer to pass from the abdominal incision to the cranial incision. A silk suture was tied to the distal part of the shunt and to the shunt passer and the distal part of the shunt was pulled through the shunt passer to the abdominal incision. We opened the dura and passed the shunt catheter into the ventricle with good release of CSF at a depth of about 7 cm. We then  tied the proximal catheter to the [place] reservoir. We had good flow of spinal fluid through the distal catheter. We then opened the anterior rectus sheath and spread the rectus musculature to find the posterior rectus sheath. This was opened and the underlying muscle was spread to identify the peritoneum below. The peritoneum was opened and omentum was seen. Then passed the distal catheter into the peritoneal cavity easily. Then the distal catheter to the posterior rectus sheath. The rectus sheath was closed with interrupted 2-0 Vicryl. Subcutaneous tissues were closed with interrupted 2-0 Vicryl and the particular tissue was closed with 2-0 Vicryl. The skin was closed with Dermabond. We then closed our cranial incision with 2-0 Vicryl in the galea. We closed the skin with staples. The drapes were removed. Sterile dressings were applied. Removed the ventriculostomy drain and sewed the exit site shut. At the end of the procedure all sponge needle and instrument counts were correct. The patient was then transferred to the PACU in stable condition.   PLAN OF CARE: Admit to inpatient   PATIENT DISPOSITION:  PACU - hemodynamically stable.   Delay start of Pharmacological VTE agent (>24hrs) due to surgical blood loss or risk of bleeding:  yes

## 2016-11-02 NOTE — Progress Notes (Signed)
PULMONARY / CRITICAL CARE MEDICINE   Name: Anne Olsen MRN: [id number] DOB: 1976/07/03    ADMISSION DATE:  10/04/2016 CONSULTATION DATE:  1/25  REFERRING MD:  Dr. Rubin PayorPickering   CHIEF COMPLAINT:  ICH  Brief: 41 y.o.female PMH of HTN who [facility] for headache, weakness, and AMS. CT head revealed an acute intraparenchymal hemorrhage in the right thalamus with a hematoma measuring 6.9 mL  SUBJECTIVE:  EVD in place. No Change in Neuro status   VITAL SIGNS: BP (!) 80/57   Pulse 66   Temp 99.8 F (37.7 C) (Axillary)   Resp (!) 24   Ht 5\' 1"  (1.549 m)   Wt 65.7 kg (144 lb 13.5 oz)   LMP  (LMP Unknown) Comment: neg preg test 10/04/16  SpO2 93%   BMI 27.37 kg/m   HEMODYNAMICS:    VENTILATOR SETTINGS: FiO2 (%):  [30 %-40 %] 30 %  INTAKE / OUTPUT: I/O last 3 completed shifts: In: 1961 [I.V.:360; Other:190; NG/GT:1411] Out: 7829 [FAOZH:[id number] [Urine:1367; Drains:348]  PHYSICAL EXAMINATION: General: Chronically ill Adult female, no distress Neuro: non-purposeful movements to extremities, forward gaze HEENT:  Trach in place < clean, dry, intact  Cardiovascular:  RRR, no MRG, NI S1/S2 Lungs:  Clear breath sounds, non-labored  Abdomen:  Non-tender, non-distended, active bowel sounds Musculoskeletal:  No acute  Skin:  Warm, dry, intact   LABS:  BMET  Recent Labs Lab 10/28/16 0254 10/30/16 0311 11/01/16 0339  NA 143 143 141  K 3.7 4.1 4.6  CL 107 107 107  CO2 29 29 24   BUN 39* 33* 25*  CREATININE 1.00 0.84 0.85  GLUCOSE 124* 96 120*    Electrolytes  Recent Labs Lab 10/27/16 0316 10/28/16 0254 10/30/16 0311 11/01/16 0339  CALCIUM 9.4 9.2 9.4 9.5  MG 2.2 2.3  --  1.9  PHOS 4.0 3.7  --  3.4    CBC  Recent Labs Lab 10/28/16 0254 10/30/16 0311 11/01/16 0339  WBC 8.3 7.3 15.2*  HGB 14.5 13.6 14.4  HCT 46.8* 43.7 44.2  PLT 277 239 254    Coag's No results for input(s): APTT, INR in the last 168 hours.  Sepsis Markers No results for  input(s): LATICACIDVEN, PROCALCITON, O2SATVEN in the last 168 hours.  ABG  Recent Labs Lab 11/01/16 1159  PHART 7.483*  PCO2ART 35.0  PO2ART 86.0    Liver Enzymes  Recent Labs Lab 10/28/16 0254  ALBUMIN 2.7*    Cardiac Enzymes No results for input(s): TROPONINI, PROBNP in the last 168 hours.  Glucose  Recent Labs Lab 11/01/16 1548 11/01/16 1944 11/01/16 2312 11/02/16 0305 11/02/16 0817 11/02/16 1149  GLUCAP 115* 132* 100* 124* 125* 122*    Imaging Ct Head Wo Contrast  Result Date: 11/02/2016 CLINICAL DATA:  Intracranial hemorrhage follow up EXAM: CT HEAD WITHOUT CONTRAST TECHNIQUE: Contiguous axial images were obtained from the base of the skull through the vertex without intravenous contrast. COMPARISON:  Head CT 10/31/2016 FINDINGS: Brain: Right frontal approach extraventricular drain catheter tip is in unchanged position. The size of the ventricles has decreased slightly, most noticeably at the third ventricle and temporal horns of the lateral ventricles. There is persistent periventricular hypoattenuation suggesting transependymal CSF flow. There is no acute hemorrhage. The basal cisterns remain crowded and the cerebral aqueduct remains effaced. Vascular: No hyperdense vessel or unexpected calcification. Skull: Right frontal burr hole. Sinuses/Orbits: The visualized portions of the paranasal sinuses and mastoid air cells are free of fluid. No advanced mucosal thickening. The  visualized orbits are normal. Other: None IMPRESSION: 1. Slightly decreased size of the lateral and third ventricles with unchanged position of EVD catheter. Persistent periventricular hypoattenuation consistent with transependymal CSF resorption. 2. Cerebral aqueduct remains effaced. 3. No acute hemorrhage or new mass effect. Electronically Signed   By: Deatra Robinson M.D.   On: 11/02/2016 04:59   Dg Chest Port 1 View  Result Date: 11/01/2016 CLINICAL DATA:  Respiratory distress. EXAM: PORTABLE  CHEST 1 VIEW COMPARISON:  One-view chest x-ray 10/24/2016 FINDINGS: The tracheostomy tube is stable. The heart size is exaggerated by low lung volumes. A diffuse interstitial pattern has increased, suggesting edema. No significant airspace consolidation is present. The visualized soft tissues and bony thorax are unremarkable. IMPRESSION: 1. Increasing interstitial pattern suggesting edema. Electronically Signed   By: Marin Roberts M.D.   On: 11/01/2016 13:06     IMAGING/STUDIES: CT HEAD CODE STROKE 1/25 >Acute intraparenchymal hemorrhage in the right thalamus with hematoma measuring 6.9 mL. Intraventricular penetration. Small amount of subarachnoid penetration. CTA of the neck and head 1/25 >do not show any atherosclerotic narrowing. The vessels are somewhat tortuous suggesting hypertension. No sign of aneurysm or vascular malformation to explain the right thalamic hemorrhage. CT HEAD 1/26 >decreased volume of right thalamic hemorrhage, intraventricular and subarachnoid hemorrhage, progressive hydrocephalus EEG 1/31>This EEG is consistent with a focal area of cerebral dysfunction in the right hemisphere superimposed on a generalized nonspecific cerebral dysfunction as can be seen with medication effect among other causes. ECHO>55-60%  MICROBIOLOGY: MRSA PCR 1/25: Negative Blood Cultures x2 2/1: Negative Urine Culture 2/8: Klebsiella Blood Cultures x2 2/8: Negative Tracheal Aspirate Culture 2/8: Negative  CSF Culture 12/12: Negative CSF Culture 12/18 >>>  ANTIBIOTICS: Unasyn 1/26 - 2/1 Rocephin 2/8 - 2/14  LINES/TUBES: OETT 7.5 1/24 >1/31 Perc trach (DF) 1/31> PEG 1/31 >  SIGNIFICANT EVENTS: 1/25 - Admit with AMS, intubated  1/26 - IVC drain placed  1/31 - PEG/Tra 2/04 - 24/7 ATC 2/21 - EVD remains in place, plan for OR today at 1430 for shunt placement.     ASSESSMENT / PLAN:  PULMONARY A: Acute on Chronic Hypoxic Respiratory Failure s/p Tracheostomy   P:   Continue TC as tolerated  Maintain Saturation >92 Trach Care per protocol  Duoneb and suction PRN  CARDIOVASCULAR A:  HTN P:  Normotensive per NS Labetalol PRN  RENAL A:   Urinary Retention  Hypomagnesemia  P:   Trend BMP Replace electrolytes as needed Free water 100 q8h  GASTROINTESTINAL A:   Dysphagia s/p g-tube  P:   Continue TF Continue PPI  HEMATOLOGIC A:   No issues  P:  Trend CBC  INFECTIOUS A:   Klebsiella UTI  -Treated with Rocephin  P:   Trend Fever and WBC curve   ENDOCRINE A:   No issues    P:   Trend Glucose on CBC  NEUROLOGIC A:   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

## 2016-11-02 NOTE — Progress Notes (Signed)
eLink Physician-Brief Progress Note Patient Name: Anne JerichoViviana Olsen DOB: 1976-07-23 MRN: [id number]   Date of Service  11/02/2016  HPI/Events of Note  On ventilator post op VP shunt.   eICU Interventions  Will order: 1. Mechanical ventilation: 40%/PRVC 14/TV 500/P 5.     Intervention Category Major Interventions: Respiratory failure - evaluation and management  Lenell AntuSommer,Steven Eugene 11/02/2016, 6:38 PM

## 2016-11-02 NOTE — Progress Notes (Signed)
Pt back from OR on ventilator due to sedation for procedure. Pt on previous vent settings. Pt stable with no complications. Pt VS within normal limits. Per CRNA when sedatioin wears off, pt will be able to go back on ATC. RT will continue to monitor.

## 2016-11-02 NOTE — Progress Notes (Signed)
Occupational Therapy Treatment Patient Details Name: Anne Olsen MRN: [id number] DOB: [birth year] Today's Date: 11/02/2016    History of present illness pt presents with Intraparenchymal Hemorrhage in R Thalamus.  She developed hydrocephalus and underwent ventriculostomy 10/05/16.  Repeat MRI 10/19/16 showed Intra-axial hematoma.   OT comments  Pt is scheduled to get a shunt this pm.  Pt did not follow commands or arouse today despite efforts of cotx with OT and PT.  Sat pt on EOB dependently for about 15 minutes.  Pt with some extensor tone noted in RLE during movement and involuntary movement noted in R side.  No movement noted in L side. Eyes closed throughout session despite attempting to arouse pt with several different stimuli.  Follow Up Recommendations  SNF    Equipment Recommendations  None recommended by OT    Recommendations for Other Services      Precautions / Restrictions Precautions Precautions: Fall Precaution Comments: venticular drain; trach collar; PEG Restrictions Weight Bearing Restrictions: No       Mobility Bed Mobility Overal bed mobility: Needs Assistance Bed Mobility: Sit to Supine;Supine to Sit Rolling: Total assist   Supine to sit: Total assist Sit to supine: Total assist;+2 for physical assistance   General bed mobility comments: Total assist today  Transfers                 General transfer comment: unable     Balance Overall balance assessment: Needs assistance Sitting-balance support: Feet supported Sitting balance-Leahy Scale: Zero Sitting balance - Comments: total A.  Poor head/neck and trunk control                            ADL Overall ADL's : Needs assistance/impaired     Grooming: Wash/dry face;Total assistance;Bed level Grooming Details (indicate cue type and reason): hand over hand total assist.                             Functional mobility during ADLs: Total assistance;+2 for physical  assistance General ADL Comments: Pt rubbed lotion in on B arms and washed facee with hand over hand assist.  Tooth brush placed in mouth and hand over hand assist provided to start this common task.  Pt not able to assist.      Vision                 Additional Comments: unable to assess.  pt with eyes closed whole session.  Does not blink to threat when eyes held open.   Perception     Praxis      Cognition   Behavior During Therapy: Flat affect Overall Cognitive Status: Difficult to assess Area of Impairment: Following commands   Current Attention Level: Focused    Following Commands:  (did not follow commands on this date)       General Comments: Pt did not follow commands today.  Eyes were closed unless therapist opened them manually.  Pt did withdraw to pain and did have some extensor tone in RLE when sitting on EOB.  With max stimulation, pt did not arouse.      Exercises     Shoulder Instructions       General Comments      Pertinent Vitals/ Pain          Home Living  Prior Functioning/Environment              Frequency  Min 2X/week        Progress Toward Goals  OT Goals(current goals can now be found in the care plan section)  Progress towards OT goals: Not progressing toward goals - comment (pt inconsistent.  No progress today.  Shunt planned today)  Acute Rehab OT Goals Patient Stated Goal: pt unable to state. OT Goal Formulation: Patient unable to participate in goal setting Time For Goal Achievement: 11/02/16 Potential to Achieve Goals: Fair ADL Goals Additional ADL Goal #1: Pt will follow one step motor commands at least 25 % of time Additional ADL Goal #2: Pt will sit EOB wtih max A in prep for ADLs  Additional ADL Goal #3: Family will be independent with PROM bil. UEs   Plan Discharge plan remains appropriate    Co-evaluation    PT/OT/SLP Co-Evaluation/Treatment:  Yes Reason for Co-Treatment: Complexity of the patient's impairments (multi-system involvement) PT goals addressed during session: Mobility/safety with mobility OT goals addressed during session: ADL's and self-care      End of Session Equipment Utilized During Treatment: Oxygen  OT Visit Diagnosis: Hemiplegia and hemiparesis Hemiplegia - Right/Left: Left Hemiplegia - dominant/non-dominant: Non-Dominant Hemiplegia - caused by: Cerebral infarction   Activity Tolerance Patient limited by lethargy   Patient Left in bed;with call bell/phone within reach   Nurse Communication Mobility status;Other (comment)        Time: 1610-9604: 1200-1222 OT Time Calculation (min): 22 min  Charges: OT General Charges $OT Visit: 1 Procedure OT Evaluation $OT Eval Moderate Complexity: 1 Procedure  Tory EmeraldHolly Arayna Illescas, OTR/L   Hope BuddsJones, Maddisen Vought Anne 11/02/2016, 1:07 PM  [phone]

## 2016-11-02 NOTE — Anesthesia Preprocedure Evaluation (Addendum)
Anesthesia Evaluation  Patient identified by MRN, date of birth, ID band Patient unresponsive    Reviewed: Unable to perform ROS - Chart review only  Airway Mallampati: Trach       Dental   Pulmonary  Pat has trach, resp failure on vent   + rhonchi  + decreased breath sounds      Cardiovascular hypertension,  Rhythm:Regular Rate:Tachycardia     Neuro/Psych ICH, hydrocephalus CVA    GI/Hepatic   Endo/Other    Renal/GU      Musculoskeletal   Abdominal   Peds  Hematology  (+) anemia ,   Anesthesia Other Findings   Reproductive/Obstetrics                            Anesthesia Physical Anesthesia Plan  ASA: IV  Anesthesia Plan: General   Post-op Pain Management:    Induction: Inhalational  Airway Management Planned: Tracheostomy  Additional Equipment:   Intra-op Plan:   Post-operative Plan: Post-operative intubation/ventilation  Informed Consent: I have reviewed the patients History and Physical, chart, labs and discussed the procedure including the risks, benefits and alternatives for the proposed anesthesia with the patient or authorized representative who has indicated his/her understanding and acceptance.     Plan Discussed with:   Anesthesia Plan Comments:         Anesthesia Quick Evaluation

## 2016-11-02 NOTE — Progress Notes (Signed)
Physical Therapy Treatment Patient Details Name: Anne Olsen MRN: [id number] DOB: 07-28-1976 Today's Date: 11/02/2016    History of Present Illness pt presents with Intraparenchymal Hemorrhage in R Thalamus.  She developed hydrocephalus and underwent ventriculostomy 10/05/16.  Repeat MRI 10/19/16 showed Intra-axial hematoma.    PT Comments    Patient not following any commands today. Eyes remained closed and not able to arouse despite maximal stimulus of different varieties. Sat EOB ~15 mins with total A for support. Some extensor tone noted in RLE during movement. Continues to have non purposeful movement right side inconsistently. Plan to go for shunt later today. Will follow.   Follow Up Recommendations  SNF     Equipment Recommendations  None recommended by PT    Recommendations for Other Services       Precautions / Restrictions Precautions Precautions: Fall Precaution Comments: venticular drain; trach collar; PEG Restrictions Weight Bearing Restrictions: No    Mobility  Bed Mobility Overal bed mobility: Needs Assistance Bed Mobility: Sit to Supine;Supine to Sit Rolling: Total assist   Supine to sit: Total assist Sit to supine: Total assist;+2 for physical assistance   General bed mobility comments: Total assist today for all mobility.   Transfers                 General transfer comment: unable   Ambulation/Gait                 Stairs            Wheelchair Mobility    Modified Rankin (Stroke Patients Only) Modified Rankin (Stroke Patients Only) Pre-Morbid Rankin Score: No symptoms Modified Rankin: Severe disability     Balance Overall balance assessment: Needs assistance Sitting-balance support: Feet supported;No upper extremity supported Sitting balance-Leahy Scale: Zero Sitting balance - Comments: total A.  Poor head/neck and trunk control. Worked on trying to arouse patient with vigorous trunk movements, functional tasks  etc.                            Cognition Arousal/Alertness: Lethargic Behavior During Therapy: Flat affect Overall Cognitive Status: Difficult to assess Area of Impairment: Following commands   Current Attention Level: Focused   Following Commands:  (did not follow any commands today)       General Comments: Pt did not follow commands today.  Eyes were closed unless therapist opened them manually.  Pt did withdraw to pain and did have some extensor tone in RLE when sitting on EOB.  With max stimulation, pt did not arouse.     Exercises      General Comments        Pertinent Vitals/Pain Pain Assessment: Faces Faces Pain Scale: No hurt    Home Living                      Prior Function            PT Goals (current goals can now be found in the care plan section) Acute Rehab PT Goals Patient Stated Goal: pt unable to state. Progress towards PT goals: Not progressing toward goals - comment    Frequency    Min 2X/week      PT Plan Current plan remains appropriate    Co-evaluation PT/OT/SLP Co-Evaluation/Treatment: Yes Reason for Co-Treatment: Complexity of the patient's impairments (multi-system involvement) PT goals addressed during session: Mobility/safety with mobility;Balance OT goals addressed during session: ADL's and self-care  End of Session Equipment Utilized During Treatment: Oxygen Activity Tolerance: Patient limited by lethargy Patient left: in bed;with call bell/phone within reach;with bed alarm set Nurse Communication: Mobility status;Need for lift equipment PT Visit Diagnosis: Hemiplegia and hemiparesis Hemiplegia - Right/Left: Left Hemiplegia - dominant/non-dominant: Non-dominant Hemiplegia - caused by: Other Nontraumatic intracranial hemorrhage     Time: 1200-1223 PT Time Calculation (min) (ACUTE ONLY): 23 min  Charges:  $Therapeutic Activity: 8-22 mins                    G Codes:       Hadli Vandemark A  Tranisha Tissue 11/02/2016, 1:53 PM Mylo RedShauna Joshau Code, PT, DPT [phone]

## 2016-11-03 DIAGNOSIS — Z931 Gastrostomy status: Secondary | ICD-10-CM

## 2016-11-03 DIAGNOSIS — E162 Hypoglycemia, unspecified: Secondary | ICD-10-CM

## 2016-11-03 DIAGNOSIS — R339 Retention of urine, unspecified: Secondary | ICD-10-CM

## 2016-11-03 LAB — BASIC METABOLIC PANEL
Anion gap: 7 (ref 5–15)
BUN: 26 mg/dL — AB (ref 6–20)
CALCIUM: 8.2 mg/dL — AB (ref 8.9–10.3)
CO2: 22 mmol/L (ref 22–32)
CREATININE: 0.91 mg/dL (ref 0.44–1.00)
Chloride: 111 mmol/L (ref 101–111)
Glucose, Bld: 147 mg/dL — ABNORMAL HIGH (ref 65–99)
Potassium: 4.4 mmol/L (ref 3.5–5.1)
SODIUM: 140 mmol/L (ref 135–145)

## 2016-11-03 LAB — CBC
HCT: 39 % (ref 36.0–46.0)
Hemoglobin: 12.3 g/dL (ref 12.0–15.0)
MCH: 29.2 pg (ref 26.0–34.0)
MCHC: 31.5 g/dL (ref 30.0–36.0)
MCV: 92.6 fL (ref 78.0–100.0)
PLATELETS: 182 10*3/uL (ref 150–400)
RBC: 4.21 MIL/uL (ref 3.87–5.11)
RDW: 13.5 % (ref 11.5–15.5)
WBC: 8.9 10*3/uL (ref 4.0–10.5)

## 2016-11-03 LAB — GLUCOSE, CAPILLARY
GLUCOSE-CAPILLARY: 100 mg/dL — AB (ref 65–99)
GLUCOSE-CAPILLARY: 82 mg/dL (ref 65–99)
GLUCOSE-CAPILLARY: 81 mg/dL (ref 65–99)
Glucose-Capillary: 68 mg/dL (ref 65–99)
Glucose-Capillary: 159 mg/dL — ABNORMAL HIGH (ref 65–99)
Glucose-Capillary: 87 mg/dL (ref 65–99)

## 2016-11-03 MED ORDER — DEXTROSE 50 % IV SOLN
INTRAVENOUS | Status: AC
  Filled 2016-11-03: qty 50

## 2016-11-03 MED ORDER — DEXTROSE 50 % IV SOLN
25.0000 mL | Freq: Once | INTRAVENOUS | Status: AC
  Administered 2016-11-03: 25 mL via INTRAVENOUS

## 2016-11-03 MED ORDER — DEXTROSE-NACL 5-0.45 % IV SOLN
INTRAVENOUS | Status: DC
  Administered 2016-11-03: 15:00:00 via INTRAVENOUS

## 2016-11-03 NOTE — Progress Notes (Signed)
Occupational Therapy Discharge Note   Therapy orders discontinued this date by MD.  Please see last note for details of status.  IF OT indicated, please reorder.  Thank you.  Jeani HawkingWendi Oron Westrup, OTR/L [phone]

## 2016-11-03 NOTE — Assessment & Plan Note (Signed)
Stable on ATC for many days as of 11/03/2016  Plan ATC as tolerated Needs trach as long as she is comatose  Pccm will sign off

## 2016-11-03 NOTE — Progress Notes (Signed)
PULMONARY / CRITICAL CARE MEDICINE   Name: Anne Olsen MRN: [id number] DOB: [birth year]    ADMISSION DATE:  10/04/2016 CONSULTATION DATE:  1/25  REFERRING MD:  Dr. Rubin Payor   CHIEF COMPLAINT:  ICH  Brief: 41 y.o.female PMH of HTN who presented 1/25 to [facility] via EMS for headache, weakness, and AMS. CT head revealed an acute intraparenchymal hemorrhage in the right thalamus with a hematoma measuring 6.9 mL   SIGNIFICANT EVENTS: 1/25 - Admit with AMS, intubated  CT HEAD CODE STROKE 1/25 >Acute intraparenchymal hemorrhage in the right thalamus with hematoma measuring 6.9 mL. Intraventricular penetration. Small amount of subarachnoid penetration. CTA of the neck and head 1/25 >do not show any atherosclerotic narrowing. The vessels are somewhat tortuous suggesting hypertension. No sign of aneurysm or vascular malformation to explain the right thalamic hemorrhage. CT HEAD 1/26 >decreased volume of right thalamic hemorrhage, intraventricular and subarachnoid hemorrhage, progressive hydrocephalus EEG 1/31>This EEG is consistent with a focal area of cerebral dysfunction in the right hemisphere superimposed on a generalized nonspecific cerebral dysfunction as can be seen with medication effect among other causes. ECHO>55-60%  1/26 - IVC drain placed  1/31 - PEG/Tra 2/04 - 24/7 ATC 2/21 - EVD remains in place, plan for OR today at 1430 for shunt placement.   2/23 - EVD in place. No Change in Neuro status     SUBJECTIVE/OVERNIGHT/INTERVAL HX 2/24 - s/p VP shunty yesterday. Left on vent overnight. Back to baseline ATC today AM. Meets transfer criteira. Continued coma +  VITAL SIGNS: BP (!) 130/115   Pulse 82   Temp 99.4 F (37.4 C) (Axillary)   Resp 20   Ht 5\' 1"  (1.549 m)   Wt 68.4 kg (150 lb 12.7 oz)   LMP  (LMP Unknown) Comment: neg preg test 10/04/16  SpO2 100%   BMI 28.49 kg/m   HEMODYNAMICS:    VENTILATOR SETTINGS: Vent Mode: Stand-by FiO2 (%):  [30 %-40 %] 30  % Set Rate:  [14 bmp] 14 bmp Vt Set:  [500 mL] 500 mL PEEP:  [5 cmH20] 5 cmH20 Plateau Pressure:  [13 cmH20-28 cmH20] 13 cmH20  INTAKE / OUTPUT: I/O last 3 completed shifts: In: 2410.3 [I.V.:1558.8; Other:60; NG/GT:741.6; IV Piggyback:50] Out: 1629 [Urine:1425; Drains:154; Blood:50]  PHYSICAL EXAMINATION:   General Appearance:    Looks chronic criticall ill  Head:    Normocephalic, without obvious abnormality, atraumatic  Eyes:    PERRL - yes, conjunctiva/corneas - clear      Ears:    Normal external ear canals, both ears  Nose:   NG tube - none  Throat:  ETT TUBE - no but has trach , OG tube - no  Neck:   Supple,  No enlargement/tenderness/nodules     Lungs:     Clear to auscultation bilaterally,   Chest wall:    No deformity  Heart:    S1 and S2 normal, no murmur, CVP - na.  Pressors - no  Abdomen:     Soft, no masses, no organomegaly. PEG tube +  Genitalia:    Not done. NO FOLEY.   Rectal:   not done  Extremities:   Extremities- getting contractures slowly     Skin:   Intact in exposed areas . Sacral area - no sores per rn     Neurologic:   Sedation - no - comatose      LABS:  PULMONARY  Recent Labs Lab 11/01/16 1159  PHART 7.483*  PCO2ART 35.0  PO2ART 86.0  HCO3  26.1  TCO2 27  O2SAT 97.0    CBC  Recent Labs Lab 10/30/16 0311 11/01/16 0339 11/03/16 0612  HGB 13.6 14.4 12.3  HCT 43.7 44.2 39.0  WBC 7.3 15.2* 8.9  PLT 239 254 182    COAGULATION No results for input(s): INR in the last 168 hours.  CARDIAC  No results for input(s): TROPONINI in the last 168 hours. No results for input(s): PROBNP in the last 168 hours.   CHEMISTRY  Recent Labs Lab 10/28/16 0254 10/30/16 0311 11/01/16 0339 11/03/16 0612  NA 143 143 141 140  K 3.7 4.1 4.6 4.4  CL 107 107 107 111  CO2 29 29 24 22   GLUCOSE 124* 96 120* 147*  BUN 39* 33* 25* 26*  CREATININE 1.00 0.84 0.85 0.91  CALCIUM 9.2 9.4 9.5 8.2*  MG 2.3  --  1.9  --   PHOS 3.7  --  3.4  --     Estimated Creatinine Clearance: 74.1 mL/min (by C-G formula based on SCr of 0.91 mg/dL).   LIVER  Recent Labs Lab 10/28/16 0254  ALBUMIN 2.7*     INFECTIOUS No results for input(s): LATICACIDVEN, PROCALCITON in the last 168 hours.   ENDOCRINE CBG (last 3)   Recent Labs  11/03/16 0616 11/03/16 0811 11/03/16 1151  GLUCAP 159* 100* 82         IMAGING x48h  - image(s) personally visualized  -   highlighted in bold Ct Head Wo Contrast  Result Date: 11/02/2016 CLINICAL DATA:  Intracranial hemorrhage follow up EXAM: CT HEAD WITHOUT CONTRAST TECHNIQUE: Contiguous axial images were obtained from the base of the skull through the vertex without intravenous contrast. COMPARISON:  Head CT 10/31/2016 FINDINGS: Brain: Right frontal approach extraventricular drain catheter tip is in unchanged position. The size of the ventricles has decreased slightly, most noticeably at the third ventricle and temporal horns of the lateral ventricles. There is persistent periventricular hypoattenuation suggesting transependymal CSF flow. There is no acute hemorrhage. The basal cisterns remain crowded and the cerebral aqueduct remains effaced. Vascular: No hyperdense vessel or unexpected calcification. Skull: Right frontal burr hole. Sinuses/Orbits: The visualized portions of the paranasal sinuses and mastoid air cells are free of fluid. No advanced mucosal thickening. The visualized orbits are normal. Other: None IMPRESSION: 1. Slightly decreased size of the lateral and third ventricles with unchanged position of EVD catheter. Persistent periventricular hypoattenuation consistent with transependymal CSF resorption. 2. Cerebral aqueduct remains effaced. 3. No acute hemorrhage or new mass effect. Electronically Signed   By: Deatra Robinson M.D.   On: 11/02/2016 04:59      IMAGING/STUDIES:  MICROBIOLOGY: MRSA PCR 10-11-2022: Negative Blood Cultures x2 2/1: Negative Urine Culture 2/8: Klebsiella Blood  Cultures x2 2/8: Negative Tracheal Aspirate Culture 2/8: Negative  CSF Culture 12/12: Negative CSF Culture 12/18 >>>  ANTIBIOTICS: Unasyn 1/26 - 2/1 Rocephin 2/8 - 2/14  LINES/TUBES: OETT 7.5 1/24 >1/31 Perc trach (DF) 1/31> PEG 1/31 >  ASSESSMENT / PLAN: ASSESSMENT and PLAN  Acute on chronic respiratory failure (HCC) Stable on ATC for many days as of 11/03/2016  Plan ATC as tolerated Needs trach as long as she is comatose  Pccm will sign off  Cerebrovascular accident (CVA) due to thrombosis of precerebral artery (HCC) Complicated by Hge  Hemorrhagic stroke (HCC) S/p evd and then 11/02/16 - s/p VP shunt by Dr Yetta Barre for course complicated by hydrocephalus  Plan  - per NSGY  S/P ventriculoperitoneal shunt Done 2/23  Tracheostomy care [facility]) Looks  good 11/03/16  PEG (percutaneous endoscopic gastrostomy) status (HCC) On peg due to stroke  Plan Continue peg feeds  Hypoglycemia Noted 2/24 despite TF  Plan Start d5 half normal kv0 cbg monitoring  Urinary retention Ever since acute admission  Plan In and out cath      FAMILY  - Updates: 11/03/2016 --> no family at bedside but updated RN  CODE STATUS    Code Status Orders        Start     Ordered   10/04/16 2152  Full code  Continuous     10/04/16 2208    Code Status History    Date Active Date Inactive Code Status Order ID Comments User Context   This patient has a current code status but no historical code status.        DISPO Transfer to neuro floor med surg and ultimately SNF. Dr Joseph ArtWoods [facility] will assume primary 11/04/16 and pccm off      Dr. Kalman ShanMurali Cella Cappello, M.D., [facility] IslandF.C.C.P Pulmonary and Critical Care Medicine Staff Physician  [facility] Pager: [phone], If no answer or between  15:00h - 7:00h: call [phone]  11/03/2016 1:59 PM

## 2016-11-03 NOTE — Progress Notes (Signed)
Patient ID: Anne JerichoViviana Olsen, female   DOB: 1975/09/25, 41 y.o.   MRN: [id number] Opening eyes spontaneously, moves r side, incisions ok

## 2016-11-03 NOTE — Assessment & Plan Note (Signed)
On peg due to stroke  Plan Continue peg feeds

## 2016-11-03 NOTE — Assessment & Plan Note (Signed)
Done 2/23

## 2016-11-03 NOTE — Assessment & Plan Note (Signed)
Ever since acute admission  Plan In and out cath

## 2016-11-03 NOTE — Progress Notes (Signed)
Came to me from 3rd floor ICU. Just med w/rectal supp tylenol for fever. Non-responsive, eyes closed. Mother at bedside.

## 2016-11-03 NOTE — Anesthesia Postprocedure Evaluation (Signed)
Anesthesia Post Note  Patient: Museum/gallery curatorViviana Olsen  Procedure(s) Performed: Procedure(s) (LRB): SHUNT INSERTION VENTRICULAR-PERITONEAL (N/A)  Patient location during evaluation: ICU Anesthesia Type: General Level of consciousness: patient remains intubated per anesthesia plan and comatose Pain management: pain level controlled Vital Signs Assessment: post-procedure vital signs reviewed and stable Respiratory status: patient remains intubated per anesthesia plan Cardiovascular status: blood pressure returned to baseline and stable Postop Assessment: no signs of nausea or vomiting Anesthetic complications: no Comments: Patient taken directly to Neuro ICU from OR. O2 @100 % by Ambu via tracheostomy.                      Davonne Baby A.

## 2016-11-03 NOTE — Assessment & Plan Note (Signed)
Complicated by Hge

## 2016-11-03 NOTE — Assessment & Plan Note (Signed)
S/p evd and then 11/02/16 - s/p VP shunt by Dr Yetta BarreJones for course complicated by hydrocephalus  Plan  - per NSGY

## 2016-11-03 NOTE — Assessment & Plan Note (Signed)
Noted 2/24 despite TF  Plan Start d5 half normal kv0 cbg monitoring

## 2016-11-03 NOTE — Assessment & Plan Note (Signed)
Looks good 11/03/16

## 2016-11-04 ENCOUNTER — Inpatient Hospital Stay (HOSPITAL_COMMUNITY): Payer: 59

## 2016-11-04 DIAGNOSIS — Z8673 Personal history of transient ischemic attack (TIA), and cerebral infarction without residual deficits: Secondary | ICD-10-CM

## 2016-11-04 DIAGNOSIS — Z982 Presence of cerebrospinal fluid drainage device: Secondary | ICD-10-CM

## 2016-11-04 DIAGNOSIS — Z515 Encounter for palliative care: Secondary | ICD-10-CM

## 2016-11-04 DIAGNOSIS — Z931 Gastrostomy status: Secondary | ICD-10-CM

## 2016-11-04 DIAGNOSIS — Z7189 Other specified counseling: Secondary | ICD-10-CM

## 2016-11-04 DIAGNOSIS — R509 Fever, unspecified: Secondary | ICD-10-CM

## 2016-11-04 LAB — BASIC METABOLIC PANEL
ANION GAP: 8 (ref 5–15)
BUN: 13 mg/dL (ref 6–20)
CO2: 17 mmol/L — ABNORMAL LOW (ref 22–32)
Calcium: 8.3 mg/dL — ABNORMAL LOW (ref 8.9–10.3)
Chloride: 108 mmol/L (ref 101–111)
Creatinine, Ser: 0.7 mg/dL (ref 0.44–1.00)
GFR calc Af Amer: >60 mL/min (ref >60)
GLUCOSE: 116 mg/dL — AB (ref 65–99)
POTASSIUM: 5.1 mmol/L (ref 3.5–5.1)
Sodium: 133 mmol/L — ABNORMAL LOW (ref 135–145)

## 2016-11-04 LAB — CSF CULTURE W GRAM STAIN: Culture: NO GROWTH

## 2016-11-04 LAB — GLUCOSE, CAPILLARY
GLUCOSE-CAPILLARY: 110 mg/dL — AB (ref 65–99)
GLUCOSE-CAPILLARY: 130 mg/dL — AB (ref 65–99)
GLUCOSE-CAPILLARY: 131 mg/dL — AB (ref 65–99)
GLUCOSE-CAPILLARY: 76 mg/dL (ref 65–99)
Glucose-Capillary: 99 mg/dL (ref 65–99)
Glucose-Capillary: 125 mg/dL — ABNORMAL HIGH (ref 65–99)

## 2016-11-04 LAB — CSF CULTURE

## 2016-11-04 LAB — CBC
HEMATOCRIT: 38.1 % (ref 36.0–46.0)
HEMOGLOBIN: 12.3 g/dL (ref 12.0–15.0)
MCH: 29.4 pg (ref 26.0–34.0)
MCHC: 32.3 g/dL (ref 30.0–36.0)
MCV: 91.1 fL (ref 78.0–100.0)
Platelets: 170 10*3/uL (ref 150–400)
RBC: 4.18 MIL/uL (ref 3.87–5.11)
RDW: 13.2 % (ref 11.5–15.5)
WBC: 10.8 10*3/uL — AB (ref 4.0–10.5)

## 2016-11-04 MED ORDER — SCOPOLAMINE 1 MG/3DAYS TD PT72
1.0000 | MEDICATED_PATCH | TRANSDERMAL | Status: DC
  Administered 2016-11-04 – 2016-11-13 (×4): 1.5 mg via TRANSDERMAL
  Filled 2016-11-04 (×4): qty 1

## 2016-11-04 MED ORDER — GLYCOPYRROLATE 0.2 MG/ML IJ SOLN
0.1000 mg | INTRAMUSCULAR | Status: DC | PRN
  Administered 2016-11-04: 0.1 mg via INTRAVENOUS
  Filled 2016-11-04: qty 1

## 2016-11-04 NOTE — Progress Notes (Signed)
Pt seen and examined.  EXAM: Temp:  [99.4 F (37.4 C)-102.2 F (39 C)] 101.2 F (38.4 C) (02/25 0648) Pulse Rate:  [65-84] 79 (02/25 0648) Resp:  [20-31] 20 (02/25 0648) BP: (100-130)/(66-115) 119/77 (02/25 0648) SpO2:  [99 %-100 %] 99 % (02/25 0648) FiO2 (%):  [28 %-30 %] 28 % (02/25 40980648) Intake/Output      02/24 0701 - 02/25 0700 02/25 0701 - 02/26 0700   I.V. (mL/kg) 535.5 (7.8)    NG/GT 595    IV Piggyback     Total Intake(mL/kg) 1130.5 (16.5)    Urine (mL/kg/hr) 600 (0.4)    Drains     Stool 0 (0)    Blood     Total Output 600     Net +530.5          Urine Occurrence 1 x    Stool Occurrence 2 x     Responds to pain right side and left lower extremity. Does not move left upper extramities. Incisions look okay.  Continue current care.

## 2016-11-04 NOTE — Progress Notes (Signed)
I met with patient's husband and patient's mother at bedside.  I updated current clinical condition and poor prognosis. I also explained about the fever, no improvement in mental status, and palliative care consult. Goals of care discussion done and family would like to talk to palliative care.  There was concern about bleeding from the tracheostomy site. During her evaluation there was no bleeding noticed. Pulmonary critical care was consulted and discussed with him.

## 2016-11-04 NOTE — Consult Note (Signed)
Consultation Note Date: 11/04/2016   Patient Name: Anne Olsen  DOB: November 07, 1975  MRN: [id number]  Age / Sex: 41 y.o., female  PCP: No primary care provider on file. Referring Physician: Rosita Fire, MD  Reason for Consultation: Establishing goals of care and Psychosocial/spiritual support  HPI/Patient Profile: 41 y.o. female  with past medical history of HTN who presented to the ED with headache, weakness, and altered mental status. She was subsequently admitted on 10/04/2016 with an acute intraparenchymal hemorrhage in the thalamus with associated hematoma and intraventricular and subarachnoid penetration. She required intubation, and is now s/p trach and PEG. She also had progressive hydrocephalus with subsequent placement of ventriculostomy drain, then right ventriculoperitoneal shunt. Neurologically she is unresponsive. Palliative consulted to assist in determining goals of care.  Clinical Assessment and Goals of Care: Anne Olsen is unresponsive and unable to meaningfully participate in conversations about goals of care. I met with her husband, Earlie Server, at her bedside. Her mother was also present for our conversation.   Mohammed and the patient's mother had a very good understanding of the patient's medical issues. They understand that the multiple medical teams do not feel she is going to have a meaningful recovery. That said, they are struggling to accept this and are holding onto hope that she will be the rare case that recovers because she is young and was otherwise healthy before this. We talked about the expected trajectory if the current level of supportive care is continued, wherein I explained the likely recurrent infections and cyclical hospitalizations. The patient's husband and her mother were both able to say that she would not want to live that way; quality of life for her would be physical independence and the capacity to enjoy  her normal activities. Despite acknowledging that her current state is not what she would want, neither her husband or mother could make the decision to pursue a comfort focused care.   In terms of code status, they both agree that DNR status is the most appropriate option. They would want her to die peacefully and would not want to put her through any further pain or suffering in the event she had cardiac or respiratory failure.   Primary Decision Maker Pt's husband.   SUMMARY OF RECOMMENDATIONS    DNR, otherwise continue full scope interventions  SW needs to discuss placement with family (I see consult has already been placed)  PMT to continue to support family   Code Status/Advance Care Planning:  DNR  Symptom Management:   Start PRN robinul for secretions  Palliative Prophylaxis:   Aspiration, Bowel Regimen, Frequent Pain Assessment, Oral Care and Turn Reposition  Additional Recommendations (Limitations, Scope, Preferences):  Full Scope Treatment  Psycho-social/Spiritual:   Desire for further Chaplaincy support:no  Prognosis:   Unable to determine  Discharge Planning: To Be Determined      Primary Diagnoses: Present on Admission: . ICH (intracerebral hemorrhage) ([place])   I have reviewed the medical record, interviewed the patient and family, and examined the patient. The following aspects are pertinent.  Past Medical History:  Diagnosis Date  . Hyperkalemia   . Hypertension    Social History   Social History  . Marital status: Married    Spouse name: N/A  . Number of children: N/A  . Years of education: N/A   Social History Main Topics  . Smoking status: Never Smoker  . Smokeless tobacco: Never Used  . Alcohol use None  . Drug use: Unknown  . Sexual activity:  Not Asked   Other Topics Concern  . None   Social History Narrative  . None   History reviewed. No pertinent family history. Scheduled Meds: . amantadine  200 mg Per Tube BID  .  bethanechol  10 mg Per Tube Q6H  . chlorhexidine  15 mL Mouth Rinse BID  . feeding supplement (PRO-STAT SUGAR FREE 64)  30 mL Per Tube Daily  . free water  100 mL Per Tube Q8H  . heparin subcutaneous  5,000 Units Subcutaneous Q8H  . mouth rinse  15 mL Mouth Rinse q12n4p  . pantoprazole sodium  40 mg Per Tube Q1200  . polyethylene glycol  17 g Oral Daily  . senna-docusate  1 tablet Oral BID   Continuous Infusions: . dextrose 5 % and 0.45% NaCl 10 mL/hr at 11/03/16 1457  . feeding supplement (JEVITY 1.2 CAL) 1,000 mL (11/03/16 1813)   PRN Meds:.sodium chloride, acetaminophen **OR** acetaminophen, acetaminophen, bisacodyl, ipratropium-albuterol, labetalol Allergies  Allergen Reactions  . No Known Allergies    Review of Systems: Pt unresponsive.  Physical Exam  Constitutional: She appears well-developed and well-nourished. She has a sickly appearance. No distress.  HENT:  Eyes closed, no resistance when I move her eyelids.  Cardiovascular: Normal rate and regular rhythm.   Pulmonary/Chest: Tachypnea noted. She has decreased breath sounds in the right lower field and the left lower field. She has no wheezes. She has rhonchi (throughout).  Trach. Audible secretions.  Abdominal: Soft. Bowel sounds are normal.  PEG. Site benign.  Musculoskeletal:  Spontaneous movement of RLE. Did not observe nor could I elicit movement on other extremities.  Neurological: She is unresponsive.  Skin: Skin is warm and dry. There is pallor.  Psychiatric:  Appears calm.   Vital Signs: BP 106/68 (BP Location: Left Arm)   Pulse 68   Temp 98.8 F (37.1 C) (Axillary)   Resp 20   Ht _0  (1.549 m)   Wt 68.4 kg (150 lb 12.7 oz)   LMP  (LMP Unknown) Comment: neg preg test 10/04/16  SpO2 94%   BMI 28.49 kg/m  Pain Assessment: PAINAD   Pain Score: 0-No pain  SpO2: SpO2: 94 % O2 Device:SpO2: 94 % O2 Flow Rate: .O2 Flow Rate (L/min): 6 L/min  IO: Intake/output summary:  Intake/Output Summary (Last  24 hours) at 11/04/16 1119 Last data filed at 11/04/16 0240  Gross per 24 hour  Intake            610.5 ml  Output              600 ml  Net             10.5 ml    LBM: Last BM Date: 11/04/16 Baseline Weight: Weight: 70 kg (154 lb 5.2 oz) (ESTIMATED) Most recent weight: Weight: 68.4 kg (150 lb 12.7 oz)     Palliative Assessment/Data: PPS 10%    Time Total: 70 minutes Greater than 50%  of this time was spent counseling and coordinating care related to the above assessment and plan.  Signed by: Charlynn Court, NP Palliative Medicine Team Pager # [phone] (M-F 7a-5p) Team Phone # [phone] (Nights/Weekends)

## 2016-11-04 NOTE — Progress Notes (Signed)
Called to pt room due to pink tinged secretions coming from mouth.  Pt appears to have been biting lip and or tongue.  Pt was not actively bleeding when I arrived.  Placed oral airway to prevent further biting.  Pt tolerating well. RT to monitor.  RN called MD

## 2016-11-04 NOTE — Progress Notes (Signed)
PROGRESS NOTE    Anne Olsen  ZOX:[id number] DOB: [birth year] DOA: 10/04/2016 PCP: No primary care provider on file.   Brief Narrative: 41 year old female with history of hypertension who was presented to [facility] on 10/04/2016 for headache, weakness and altered mental status. CT scan of head revealed an acute intraparenchymal hemorrhage in the right thalamus with hematoma. Patient was seen by neurosurgery and admitted in PCCM. Patient with prolonged hospitalization requiring mechanical ventilation. Patient is now on trach collar and has PEG tube. VP shunt was placed by neurosurgery. Patient was transferred from [facility] to [facility] on 11/04/2016, on 31th day of inpatient hospitalization.  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 -Follow-up neurosurgery for further care. -Patient is unresponsive.  #Acute on chronic hypoxic respiratory failure: -Currently has trach collar. I discussed with the respiratory therapist, patient required frequent suctioning. -I also called the CCM and discussed with Dr. Marchelle Gearing. -Currently on 6 L, 28% FiO2. -Continue supportive care.  #Acute febrile illness: I discussed this with the neurosurgery Dr. Bevely Palmer today. Do not think this is related with recent VP shunt. -I will do workup of fever including 2 sets of blood culture, UA, urine culture, chest x-ray. Check CBC and BMP. -I consulted infectious disease and discussed with Dr. Luciana Axe. Defer to ID, if patient needs any empiric antibiotics. -Blood pressure acceptable.  #Hypoglycemia: Blood sugar level acceptable. Continue tube feed. Patient is also on dextrose IV. May be able to discontinue IV fluid if patient's blood sugar level improves with tube feed.   # Goals of care discussion: I consulted palliative care to evaluate the patient. -Also consulted social worker regarding possible SNF discharge when  patient is medically stable  Continue current medical and supportive care.  Active Problems:   ICH (intracerebral hemorrhage) (HCC)   Hemorrhagic stroke (HCC)   Acute respiratory failure (HCC)   Hemorrhage   Nontraumatic subcortical hemorrhage of left cerebral hemisphere (HCC)   Acute on chronic respiratory failure (HCC)   Cerebrovascular accident (CVA) due to thrombosis of precerebral artery (HCC)   Tracheostomy care (HCC)   S/P ventriculoperitoneal shunt   PEG (percutaneous endoscopic gastrostomy) status (HCC)   Urinary retention   Hypoglycemia   DVT prophylaxis: SCD. No anticoagulation because of intracranial hemorrhage Code Status: Full code Family Communication: No family available at bedside Disposition Plan: Likely discharge to SNF in 3-4 days when patient is medically stable    Consultants:     Procedures:' 1/25 - Admit with AMS, intubated  CT HEAD CODE STROKE 1/25 >Acute intraparenchymal hemorrhage in the right thalamus with hematoma measuring 6.9 mL. Intraventricular penetration. Small amount of subarachnoid penetration. CTA of the neck and head 1/25 >do not show any atherosclerotic narrowing. The vessels are somewhat tortuous suggesting hypertension. No sign of aneurysm or vascular malformation to explain the right thalamic hemorrhage. CT HEAD 1/26 >decreased volume of right thalamic hemorrhage, intraventricular and subarachnoid hemorrhage, progressive hydrocephalus EEG 1/31>This EEG is consistent with a focal area of cerebral dysfunction in the right hemisphere superimposed on a generalized nonspecific cerebral dysfunction as can be seen with medication effect among other causes. ECHO>55-60% 1/26 - IVC drain placed  1/31 - PEG/Tra 2/04 - 24/7 ATC 2/21 - EVD remains in place, plan for OR today at 1430 for shunt placement.  2/23 - EVD in place. No Change in Neuro status   Antimicrobials: MRSA PCR 1/25: Negative Blood Cultures x2 2/1: Negative Urine  Culture 2/8: Klebsiella Blood Cultures x2 2/8: Negative Tracheal Aspirate Culture 2/8: Negative  CSF Culture 12/12: Negative CSF Culture 12/18 >>>  ANTIBIOTICS: Unasyn 1/26 - 2/1 Rocephin 2/8 - 2/14  LINES/TUBES: OETT 7.5 1/24 >1/31 Perc trach (DF) 1/31> PEG 1/31 >  Subjective: The patient was seen and examined at bedside. Patient was unresponsive. She has trach collar and PEG tube. Unable to obtain review of system.  Objective: Vitals:   11/04/16 0409 11/04/16 0648 11/04/16 0947 11/04/16 0952  BP:  119/77  106/68  Pulse: 75 79 72 68  Resp: 20 20 (!) 22 20  Temp:  (!) 101.2 F (38.4 C)  98.8 F (37.1 C)  TempSrc:  Axillary  Axillary  SpO2: 100% 99% 95% 94%  Weight:      Height:        Intake/Output Summary (Last 24 hours) at 11/04/16 1027 Last data filed at 11/04/16 0240  Gross per 24 hour  Intake            740.5 ml  Output              600 ml  Net            140.5 ml   Filed Weights   11/01/16 0600 11/02/16 0600 11/03/16 0500  Weight: 65.1 kg (143 lb 8.3 oz) 65.7 kg (144 lb 13.5 oz) 68.4 kg (150 lb 12.7 oz)    Examination:  General exam: Ill-looking unresponsive female lying on bed. HEENT: Trach collar with secretions  Respiratory system: Clear bilateral,no wheezing appreciated. Cardiovascular system: S1 & S2 heard, RRR.  trace pedal edema. Gastrointestinal system: Abdomen is soft, bowel sounds positive. PEG tube site clean. Central nervous system: Unresponsive Skin: No rashes, lesions or ulcers Psychiatry: Unable to assess as patient is unresponsive.     Data Reviewed: I have personally reviewed following labs and imaging studies  CBC:  Recent Labs Lab 10/30/16 0311 11/01/16 0339 11/03/16 0612  WBC 7.3 15.2* 8.9  HGB 13.6 14.4 12.3  HCT 43.7 44.2 39.0  MCV 94.4 91.1 92.6  PLT 239 254 182   Basic Metabolic Panel:  Recent Labs Lab 10/30/16 0311 11/01/16 0339 11/03/16 0612  NA 143 141 140  K 4.1 4.6 4.4  CL 107 107 111  CO2 29 24  22   GLUCOSE 96 120* 147*  BUN 33* 25* 26*  CREATININE 0.84 0.85 0.91  CALCIUM 9.4 9.5 8.2*  MG  --  1.9  --   PHOS  --  3.4  --    GFR: Estimated Creatinine Clearance: 74.1 mL/min (by C-G formula based on SCr of 0.91 mg/dL). Liver Function Tests: No results for input(s): AST, ALT, ALKPHOS, BILITOT, PROT, ALBUMIN in the last 168 hours. No results for input(s): LIPASE, AMYLASE in the last 168 hours. No results for input(s): AMMONIA in the last 168 hours. Coagulation Profile: No results for input(s): INR, PROTIME in the last 168 hours. Cardiac Enzymes: No results for input(s): CKTOTAL, CKMB, CKMBINDEX, TROPONINI in the last 168 hours. BNP (last 3 results) No results for input(s): PROBNP in the last 8760 hours. HbA1C: No results for input(s): HGBA1C in the last 72 hours. CBG:  Recent Labs Lab 11/03/16 1607 11/03/16 1955 11/04/16 0008 11/04/16 0356 11/04/16 0818  GLUCAP 81 87 76 99 125*   Lipid Profile: No results for input(s): CHOL, HDL, LDLCALC, TRIG, CHOLHDL, LDLDIRECT in the last 72 hours. Thyroid Function Tests: No results for input(s): TSH, T4TOTAL, FREET4, T3FREE, THYROIDAB in the last 72 hours.  Anemia Panel: No results for input(s): VITAMINB12, FOLATE, FERRITIN, TIBC, IRON, RETICCTPCT in the last 72 hours. Sepsis Labs: No results for input(s): PROCALCITON, LATICACIDVEN in the last 168 hours.  Recent Results (from the past 240 hour(s))  CSF culture with Stat gram stain     Status: None   Collection Time: 10/28/16  6:56 AM  Result Value Ref Range Status   Specimen Description CSF  Final   Special Requests IN SYRINGE  Final   Gram Stain   Final    CYTOSPIN SMEAR WBC PRESENT, PREDOMINANTLY MONONUCLEAR NO ORGANISMS SEEN    Culture NO GROWTH 3 DAYS  Final   Report Status 10/31/2016 FINAL  Final  CSF culture with Stat gram stain     Status: None (Preliminary result)   Collection Time: 11/01/16 10:55 AM  Result Value Ref Range Status   Specimen Description CSF   Final   Special Requests 1CC SPECIMEN CUP C  Final   Gram Stain   Final    WBC PRESENT,BOTH PMN AND MONONUCLEAR NO ORGANISMS SEEN CYTOSPIN SMEAR    Culture NO GROWTH 3 DAYS  Final   Report Status PENDING  Incomplete         Radiology Studies: No results found.      Scheduled Meds: . amantadine  200 mg Per Tube BID  . bethanechol  10 mg Per Tube Q6H  . chlorhexidine  15 mL Mouth Rinse BID  . feeding supplement (PRO-STAT SUGAR FREE 64)  30 mL Per Tube Daily  . free water  100 mL Per Tube Q8H  . heparin subcutaneous  5,000 Units Subcutaneous Q8H  . mouth rinse  15 mL Mouth Rinse q12n4p  . pantoprazole sodium  40 mg Per Tube Q1200  . polyethylene glycol  17 g Oral Daily  . senna-docusate  1 tablet Oral BID   Continuous Infusions: . dextrose 5 % and 0.45% NaCl 10 mL/hr at 11/03/16 1457  . feeding supplement (JEVITY 1.2 CAL) 1,000 mL (11/03/16 1813)     LOS: 31 days    Anne Olsen Jaynie Collins, MD [facility] Pager [phone]  If 7PM-7AM, please contact night-coverage [email or web address] Password [facility] 11/04/2016, 10:27 AM

## 2016-11-04 NOTE — Progress Notes (Signed)
Called by Dr. Ronalee BeltsBhandari to assess trach site bleeding.  Chart review shows she had bit her tongue/lip earlier in the day.    Exam: Trach site midline, c/d/i No evidence of bleeding at present Continue trach care per protocol  Monitor for further bleeding.    Canary BrimBrandi Ollis, NP-C [facility] Pgr: [phone] or if no answer [phone] 11/04/2016, 6:14 PM

## 2016-11-04 NOTE — Progress Notes (Signed)
Late entry- Pt w/ copious secretions this morning, per attending in room (at time of eval) hold off on changing trach today and RT to reassess for trach change tomorrow.

## 2016-11-04 NOTE — Consult Note (Signed)
[facility]       Reason for Consult:fever    Referring Physician: Dr. Ronalee Belts  Active Problems:   ICH (intracerebral hemorrhage) (HCC)   Hemorrhagic stroke (HCC)   Acute respiratory failure (HCC)   Hemorrhage   Nontraumatic subcortical hemorrhage of left cerebral hemisphere (HCC)   Acute on chronic respiratory failure (HCC)   Cerebrovascular accident (CVA) due to thrombosis of precerebral artery (HCC)   Tracheostomy care (HCC)   S/P ventriculoperitoneal shunt   PEG (percutaneous endoscopic gastrostomy) status (HCC)   Urinary retention   Hypoglycemia   Fever   . amantadine  200 mg Per Tube BID  . bethanechol  10 mg Per Tube Q6H  . chlorhexidine  15 mL Mouth Rinse BID  . feeding supplement (PRO-STAT SUGAR FREE 64)  30 mL Per Tube Daily  . free water  100 mL Per Tube Q8H  . heparin subcutaneous  5,000 Units Subcutaneous Q8H  . mouth rinse  15 mL Mouth Rinse q12n4p  . pantoprazole sodium  40 mg Per Tube Q1200  . polyethylene glycol  17 g Oral Daily  . senna-docusate  1 tablet Oral BID    Recommendations: UA, blood cultures as has been done Continue to observe off of antibiotics   Assessment: She has a fever during this prolonged hospitalization and etiology broad including nosocomial infection, VP shunt infection, medication and other non infectious etiologies.  No localizing signs at this time.    Antibiotics: Recent vancomycin, ceftriaxone and cefazolin for surgical prophylaxis  HPI: Anne Olsen is a 41 y.o. female with HTN who intially presented 10/04/16 with headache, vomiting and loss of consciousness, intubated and found to have acute parenchymal hemorrhage of right thalamus and brainstem with extension to the fourth ventricle and subarachnoid space.  She had a prolonged hospitalization and currently is with a trach, PEG, and required external shunt and 2/23 had right ventriculoperitoneal shunt placed.  No complications with that.   No hypoxia and currently on a trach collar.  No other history obtainable from the patient.   CXR independently reviewed and no opacity, some minimal right lower lobe airspace disease but improved compared to previous CXR  Review of Systems: Unobtainable due to patient factors   Past Medical History:  Diagnosis Date  . Hyperkalemia   . Hypertension   ICH  From chart review, not obtainable from the patient  Social History  Substance Use Topics  . Smoking status: Never Smoker  . Smokeless tobacco: Never Used  . Alcohol use Not on file   From chart review, not obtainable from the patient  History reviewed. No pertinent family history. From chart review, not obtainable from the patient  Allergies  Allergen Reactions  . No Known Allergies    From chart review, not obtainable from the patient  Physical Exam: Constitutional: NAD   Vitals:   11/04/16 0952 11/04/16 1349  BP: 106/68 134/78  Pulse: 68 79  Resp: 20 20  Temp: 98.8 F (37.1 C) 100 F (37.8 C)   EYES: anicteric ENMT: + trach with trach collar Cardiovascular: Cor RRR Respiratory: breathing via trach; coarse breath sounds anteriorly GI: Bowel sounds are normal, liver is not enlarged, spleen is not enlarged Musculoskeletal: no pedal edema noted Skin: negatives: no rash Neuro:unresponsive Lines: peripheral IV  Lab Results  Component Value Date   WBC 10.8 (H) 11/04/2016   HGB 12.3 11/04/2016   HCT 38.1 11/04/2016   MCV 91.1 11/04/2016   PLT 170 11/04/2016  Lab Results  Component Value Date   CREATININE 0.70 11/04/2016   BUN 13 11/04/2016   NA 133 (L) 11/04/2016   K 5.1 11/04/2016   CL 108 11/04/2016   CO2 17 (L) 11/04/2016    Lab Results  Component Value Date   ALT 28 10/23/2016   AST 21 10/23/2016   ALKPHOS 53 10/23/2016     Microbiology: Recent Results (from the past 240 hour(s))  CSF culture with Stat gram stain     Status: None   Collection Time: 10/28/16  6:56 AM  Result Value Ref  Range Status   Specimen Description CSF  Final   Special Requests IN SYRINGE  Final   Gram Stain   Final    CYTOSPIN SMEAR WBC PRESENT, PREDOMINANTLY MONONUCLEAR NO ORGANISMS SEEN    Culture NO GROWTH 3 DAYS  Final   Report Status 10/31/2016 FINAL  Final  CSF culture with Stat gram stain     Status: None (Preliminary result)   Collection Time: 11/01/16 10:55 AM  Result Value Ref Range Status   Specimen Description CSF  Final   Special Requests 1CC SPECIMEN CUP C  Final   Gram Stain   Final    WBC PRESENT,BOTH PMN AND MONONUCLEAR NO ORGANISMS SEEN CYTOSPIN SMEAR    Culture NO GROWTH 3 DAYS  Final   Report Status PENDING  Incomplete    Staci RighterOMER, ROBERT, MD [facility] [email or web address] [phone] pager  [phone] cell 11/04/2016, 2:07 PM

## 2016-11-05 ENCOUNTER — Encounter (HOSPITAL_COMMUNITY): Payer: Self-pay | Admitting: Neurological Surgery

## 2016-11-05 DIAGNOSIS — Z7189 Other specified counseling: Secondary | ICD-10-CM

## 2016-11-05 LAB — GLUCOSE, CAPILLARY
GLUCOSE-CAPILLARY: 119 mg/dL — AB (ref 65–99)
GLUCOSE-CAPILLARY: 126 mg/dL — AB (ref 65–99)
GLUCOSE-CAPILLARY: 131 mg/dL — AB (ref 65–99)
Glucose-Capillary: 130 mg/dL — ABNORMAL HIGH (ref 65–99)
Glucose-Capillary: 111 mg/dL — ABNORMAL HIGH (ref 65–99)
Glucose-Capillary: 121 mg/dL — ABNORMAL HIGH (ref 65–99)
Glucose-Capillary: 139 mg/dL — ABNORMAL HIGH (ref 65–99)

## 2016-11-05 LAB — BASIC METABOLIC PANEL
ANION GAP: 6 (ref 5–15)
BUN: 10 mg/dL (ref 6–20)
CALCIUM: 8.4 mg/dL — AB (ref 8.9–10.3)
CO2: 22 mmol/L (ref 22–32)
Chloride: 104 mmol/L (ref 101–111)
Creatinine, Ser: 0.72 mg/dL (ref 0.44–1.00)
GFR calc Af Amer: >60 mL/min (ref >60)
GLUCOSE: 109 mg/dL — AB (ref 65–99)
Potassium: 5 mmol/L (ref 3.5–5.1)
Sodium: 132 mmol/L — ABNORMAL LOW (ref 135–145)

## 2016-11-05 LAB — CBC
HCT: 39 % (ref 36.0–46.0)
HEMOGLOBIN: 12.8 g/dL (ref 12.0–15.0)
MCH: 29.4 pg (ref 26.0–34.0)
MCHC: 32.8 g/dL (ref 30.0–36.0)
MCV: 89.4 fL (ref 78.0–100.0)
Platelets: 203 10*3/uL (ref 150–400)
RBC: 4.36 MIL/uL (ref 3.87–5.11)
RDW: 13.2 % (ref 11.5–15.5)
WBC: 11 10*3/uL — ABNORMAL HIGH (ref 4.0–10.5)

## 2016-11-05 NOTE — NC FL2 (Addendum)
[place] MEDICAID FL2 LEVEL OF CARE SCREENING TOOL     IDENTIFICATION  Patient Name: Anne Olsen Birthdate: 05-25-1976 Sex: female Admission Date (Current Location): 10/04/2016  [facility] and [place] Number:  Producer, television/film/video and Address:  [facility], [address], [place], [place] [address]      Provider Number: [id number]  Attending Physician Name and Address:  Maxie Barb, MD  Relative Name and Phone Number:       Current Level of Care: Hospital Recommended Level of Care: Skilled Nursing Facility Prior Approval Number:    Date Approved/Denied:   PASRR Number:   [id number]   Discharge Plan: SNF    Current Diagnoses: Patient Active Problem List   Diagnosis Date Noted  . Fever   . Goals of care, counseling/discussion   . Palliative care by specialist   . PEG (percutaneous endoscopic gastrostomy) status (HCC) 11/03/2016  . Urinary retention 11/03/2016  . Hypoglycemia 11/03/2016  . S/P ventriculoperitoneal shunt 11/02/2016  . Tracheostomy care (HCC)   . Cerebrovascular accident (CVA) due to thrombosis of precerebral artery (HCC)   . Acute on chronic respiratory failure (HCC)   . Nontraumatic subcortical hemorrhage of left cerebral hemisphere (HCC)   . Hemorrhage   . Acute respiratory failure with hypoxia (HCC)   . Hemorrhagic stroke (HCC)   . ICH (intracerebral hemorrhage) (HCC) 10/04/2016    Orientation RESPIRATION BLADDER Height & Weight        Tracheostomy, O2 (28% FiO2, 6mm (will be uncuffed at DC)- placed 1/31) Incontinent Weight: 159 lb (72.1 kg) Height:  5\' 1"  (154.9 cm)  BEHAVIORAL SYMPTOMS/MOOD NEUROLOGICAL BOWEL NUTRITION STATUS      Incontinent Feeding tube  AMBULATORY STATUS COMMUNICATION OF NEEDS Skin   Total Care Does not communicate Surgical wounds                       Personal Care Assistance Level of Assistance  Bathing, Dressing, Feeding, Total care Bathing Assistance: Maximum  assistance Feeding assistance: Maximum assistance Dressing Assistance: Maximum assistance Total Care Assistance: Maximum assistance   Functional Limitations Info             SPECIAL CARE FACTORS FREQUENCY                       Contractures      Additional Factors Info  Code Status, Allergies Code Status Info: DNR Allergies Info: NKA           Current Medications (11/05/2016):  This is the current hospital active medication list Current Facility-Administered Medications  Medication Dose Route Frequency Provider Last Rate Last Dose  . 0.9 %  sodium chloride infusion  250 mL Intravenous PRN Alexa Lucrezia Starch, MD 0 mL/hr at 10/14/16 0800    . acetaminophen (TYLENOL) tablet 650 mg  650 mg Oral Q4H PRN Tia Alert, MD   650 mg at 11/04/16 5621   Or  . acetaminophen (TYLENOL) suppository 650 mg  650 mg Rectal Q4H PRN Tia Alert, MD   650 mg at 11/04/16 1747  . acetaminophen (TYLENOL) tablet 650 mg  650 mg Oral Q4H PRN Servando Snare, MD   650 mg at 10/16/16 1433  . amantadine (SYMMETREL) solution 200 mg  200 mg Per Tube BID Nelda Bucks, MD   200 mg at 11/05/16 1034  . bethanechol (URECHOLINE) tablet 10 mg  10 mg Per Tube Q6H Roslynn Amble, MD  10 mg at 11/05/16 1245  . bisacodyl (DULCOLAX) suppository 10 mg  10 mg Rectal Daily PRN Jeanella CrazeBrandi L Ollis, NP   10 mg at 10/13/16 1610  . chlorhexidine (PERIDEX) 0.12 % solution 15 mL  15 mL Mouth Rinse BID Nelda Bucksaniel J Feinstein, MD   15 mL at 11/05/16 1010  . feeding supplement (JEVITY 1.2 CAL) liquid 1,000 mL  1,000 mL Per Tube Continuous Kalman ShanMurali Ramaswamy, MD 55 mL/hr at 11/04/16 1731 1,000 mL at 11/04/16 1731  . feeding supplement (PRO-STAT SUGAR FREE 64) liquid 30 mL  30 mL Per Tube Daily Kalman ShanMurali Ramaswamy, MD   30 mL at 11/05/16 1010  . free water 100 mL  100 mL Per Tube Q8H Roslynn AmbleJennings E Nestor, MD   100 mL at 11/05/16 0600  . glycopyrrolate (ROBINUL) injection 0.1 mg  0.1 mg Intravenous Q4H PRN Ranae PalmsSarah Elizabeth Dunn, NP   0.1 mg  at 11/04/16 1723  . heparin injection 5,000 Units  5,000 Units Subcutaneous Q8H Roslynn AmbleJennings E Nestor, MD   5,000 Units at 11/05/16 0533  . ipratropium-albuterol (DUONEB) 0.5-2.5 (3) MG/3ML nebulizer solution 3 mL  3 mL Nebulization Q6H PRN Tammy S Parrett, NP      . labetalol (NORMODYNE,TRANDATE) injection 10 mg  10 mg Intravenous Q2H PRN Alexa Lucrezia Starch Burns, MD   10 mg at 10/06/16 1146  . MEDLINE mouth rinse  15 mL Mouth Rinse q12n4p Nelda Bucksaniel J Feinstein, MD   15 mL at 11/03/16 1200  . pantoprazole sodium (PROTONIX) 40 mg/20 mL oral suspension 40 mg  40 mg Per Tube Q1200 Cyril Mourningakesh Alva V, MD   40 mg at 11/05/16 1245  . polyethylene glycol (MIRALAX / GLYCOLAX) packet 17 g  17 g Oral Daily Nelda Bucksaniel J Feinstein, MD   17 g at 11/05/16 1011  . scopolamine (TRANSDERM-SCOP) 1 MG/3DAYS 1.5 mg  1 patch Transdermal Q72H Roma KayserKatherine P Schorr, NP   1.5 mg at 11/04/16 2042  . senna-docusate (Senokot-S) tablet 1 tablet  1 tablet Oral BID Servando SnareAlexa R Burns, MD   1 tablet at 11/05/16 1011     Discharge Medications: Please see discharge summary for a list of discharge medications.  Relevant Imaging Results:  Relevant Lab Results:   Additional Information SS#: [id number]  Burna SisUris, Izyk Marty H, LCSW

## 2016-11-05 NOTE — Progress Notes (Signed)
Patient ID: Anne JerichoViviana Quinonez, female   DOB: 08/24/1976, 41 y.o.   MRN: [id number] Palliative care notes noted. Incisions look good. Shunt pumps and refills.

## 2016-11-05 NOTE — Progress Notes (Signed)
PROGRESS NOTE    Anne Olsen  AOZ:[id number] DOB: 20-Feb-1976 DOA: 10/04/2016 PCP: No primary care provider on file.   Brief Narrative: 41 year old female with history of hypertension who was presented to [facility] on 10/04/2016 for headache, weakness and altered mental status. CT scan of head revealed an acute intraparenchymal hemorrhage in the right thalamus with hematoma. Patient was seen by neurosurgery and admitted in PCCM. Patient with prolonged hospitalization requiring mechanical ventilation. Patient is now on trach collar and has PEG tube. VP shunt was placed by neurosurgery. Patient was transferred from [facility] to [facility] on 11/04/2016, on 31th day of inpatient hospitalization.  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 -Follow-up neurosurgery for further care. -Patient is unresponsive. No change in mental status  #Acute on chronic hypoxic respiratory failure: -Currently has trach collar. No sign of bleeding.. -Currently on 6 L, 28% FiO2. -Continue supportive care.  #Acute febrile illness: Patient with intermittent elevated temperature likely central origin. -Neurosurgeon do not take it is related with VP shunt. UA negative for UTI. Chest x-ray improving with no pneumonia. Pending culture results. ID consult appreciated. Continue follow-up recommendation.  -Blood pressure acceptable.  #Hypoglycemia: Blood sugar level acceptable now. Continue tube feed.  -dc dextrose IV.    # Goals of care discussion:  -I had a long discussion with the patient husband and patient's mother yesterday. Also discussed with the palliative care service. Patient is  DO NOT RESUSCITATE. We will continue current medical treatment. Social worker consulted for the evaluation of SNF or LTAC for safe discharge planning. For now continue current medical and supportive care.  Active Problems:   ICH  (intracerebral hemorrhage) (HCC)   Hemorrhagic stroke (HCC)   Acute respiratory failure with hypoxia (HCC)   Hemorrhage   Nontraumatic subcortical hemorrhage of left cerebral hemisphere (HCC)   Acute on chronic respiratory failure (HCC)   Cerebrovascular accident (CVA) due to thrombosis of precerebral artery (HCC)   Tracheostomy care (HCC)   S/P ventriculoperitoneal shunt   PEG (percutaneous endoscopic gastrostomy) status (HCC)   Urinary retention   Hypoglycemia   Fever   Goals of care, counseling/discussion   Palliative care by specialist   DVT prophylaxis: SCD. No anticoagulation because of intracranial hemorrhage Code Status: Full code Family Communication: I discussed with the patient husband and mother on 2/25 Disposition Plan: Likely discharge to SNF or LTAC in 2-3 days when patient is medically stable   Consultants:   Neurosurgery  Pulmonary critical care  Palliative care  Infectious disease  Procedures:' 1/25 - Admit with AMS, intubated  CT HEAD CODE STROKE 1/25 >Acute intraparenchymal hemorrhage in the right thalamus with hematoma measuring 6.9 mL. Intraventricular penetration. Small amount of subarachnoid penetration. CTA of the neck and head 1/25 >do not show any atherosclerotic narrowing. The vessels are somewhat tortuous suggesting hypertension. No sign of aneurysm or vascular malformation to explain the right thalamic hemorrhage. CT HEAD 1/26 >decreased volume of right thalamic hemorrhage, intraventricular and subarachnoid hemorrhage, progressive hydrocephalus EEG 1/31>This EEG is consistent with a focal area of cerebral dysfunction in the right hemisphere superimposed on a generalized nonspecific cerebral dysfunction as can be seen with medication effect among other causes. ECHO>55-60% 1/26 - IVC drain placed  1/31 - PEG/Tra 2/04 - 24/7 ATC 2/21 - EVD remains in place, plan for OR today at 1430 for shunt placement.  2/23 - EVD in place. No Change  in Neuro status  Antimicrobials: MRSA PCR 1/25: Negative Blood Cultures x2 2/1: Negative Urine Culture 2/8: Klebsiella Blood Cultures x2 2/8: Negative Tracheal Aspirate Culture 2/8: Negative  CSF Culture 12/12: Negative CSF Culture 12/18 >>>  ANTIBIOTICS: Unasyn 1/26 - 2/1 Rocephin 2/8 - 2/14  LINES/TUBES: OETT 7.5 1/24 >1/31 Perc trach (DF) 1/31> PEG 1/31 >  Subjective: The patient was seen and examined at bedside. No changes in mental status. Patient is unresponsive.  Objective: Vitals:   11/05/16 0824 11/05/16 0958 11/05/16 1003 11/05/16 1215  BP: 122/82 130/84    Pulse: 73 73 75 72  Resp: 20 18 20  (!) 22  Temp: 98.4 F (36.9 C) 98.6 F (37 C)    TempSrc: Axillary Axillary    SpO2: 96% 95% 96% 92%  Weight:      Height:       No intake or output data in the 24 hours ending 11/05/16 1301 Filed Weights   11/02/16 0600 11/03/16 0500 11/05/16 0335  Weight: 65.7 kg (144 lb 13.5 oz) 68.4 kg (150 lb 12.7 oz) 72.1 kg (159 lb)    Examination:  General exam: Ill-looking unresponsive female lying on bed.Unchanged HEENT: Trach collar with no sign of discharge or bleeding  Respiratory system: Clear bilateral,no wheezing appreciated. Cardiovascular system: S1 & S2 heard, RRR.  trace pedal edema. Gastrointestinal system: Abdomen is soft, bowel sounds positive. PEG tube site clean. Central nervous system: Unresponsive Skin: No rashes, lesions or ulcers Psychiatry: Unable to assess as patient is unresponsive.     Data Reviewed: I have personally reviewed following labs and imaging studies  CBC:  Recent Labs Lab 10/30/16 0311 11/01/16 0339 11/03/16 0612 11/04/16 1039 11/05/16 0708  WBC 7.3 15.2* 8.9 10.8* 11.0*  HGB 13.6 14.4 12.3 12.3 12.8  HCT 43.7 44.2 39.0 38.1 39.0  MCV 94.4 91.1 92.6 91.1 89.4  PLT 239 254 182 170 203   Basic Metabolic Panel:  Recent Labs Lab 10/30/16 0311 11/01/16 0339 11/03/16 0612 11/04/16 1039 11/05/16 0708  NA 143 141  140 133* 132*  K 4.1 4.6 4.4 5.1 5.0  CL 107 107 111 108 104  CO2 29 24 22  17* 22  GLUCOSE 96 120* 147* 116* 109*  BUN 33* 25* 26* 13 10  CREATININE 0.84 0.85 0.91 0.70 0.72  CALCIUM 9.4 9.5 8.2* 8.3* 8.4*  MG  --  1.9  --   --   --   PHOS  --  3.4  --   --   --    GFR: Estimated Creatinine Clearance: 86.5 mL/min (by C-G formula based on SCr of 0.72 mg/dL). Liver Function Tests: No results for input(s): AST, ALT, ALKPHOS, BILITOT, PROT, ALBUMIN in the last 168 hours. No results for input(s): LIPASE, AMYLASE in the last 168 hours. No results for input(s): AMMONIA in the last 168 hours. Coagulation Profile: No results for input(s): INR, PROTIME in the last 168 hours. Cardiac Enzymes: No results for input(s): CKTOTAL, CKMB, CKMBINDEX, TROPONINI in the last 168 hours. BNP (last 3 results) No results for input(s): PROBNP in the last 8760 hours. HbA1C: No results for input(s): HGBA1C in the last 72 hours. CBG:  Recent Labs Lab 11/04/16 1703 11/04/16 1945 11/05/16 0411 11/05/16 0804 11/05/16 1154  GLUCAP 131* 130* 119* 130* 121*   Lipid Profile: No results for input(s): CHOL, HDL, LDLCALC, TRIG, CHOLHDL, LDLDIRECT in the last 72 hours. Thyroid Function Tests: No results for input(s): TSH, T4TOTAL, FREET4, T3FREE, THYROIDAB in the last 72 hours. Anemia Panel: No results for input(s):  VITAMINB12, FOLATE, FERRITIN, TIBC, IRON, RETICCTPCT in the last 72 hours. Sepsis Labs: No results for input(s): PROCALCITON, LATICACIDVEN in the last 168 hours.  Recent Results (from the past 240 hour(s))  CSF culture with Stat gram stain     Status: None   Collection Time: 10/28/16  6:56 AM  Result Value Ref Range Status   Specimen Description CSF  Final   Special Requests IN SYRINGE  Final   Gram Stain   Final    CYTOSPIN SMEAR WBC PRESENT, PREDOMINANTLY MONONUCLEAR NO ORGANISMS SEEN    Culture NO GROWTH 3 DAYS  Final   Report Status 10/31/2016 FINAL  Final  CSF culture with Stat gram  stain     Status: None   Collection Time: 11/01/16 10:55 AM  Result Value Ref Range Status   Specimen Description CSF  Final   Special Requests 1CC SPECIMEN CUP C  Final   Gram Stain   Final    WBC PRESENT,BOTH PMN AND MONONUCLEAR NO ORGANISMS SEEN CYTOSPIN SMEAR    Culture NO GROWTH 3 DAYS  Final   Report Status 11/04/2016 FINAL  Final         Radiology Studies: Dg Chest Port 1 View  Result Date: 11/04/2016 CLINICAL DATA:  Fever.  VP shunt insertion 11/02/2016 EXAM: PORTABLE CHEST 1 VIEW COMPARISON:  11/01/2016 FINDINGS: Tracheostomy remains in good position. Improved aeration in the lung bases with decreased airspace disease. Mild residual right lower lobe airspace disease. No effusion Interval placement of ventricular peritoneal shunt tubing overlying the right chest and abdomen. IMPRESSION: Tracheostomy remains in good position Improved aeration in the lung bases compared with the prior study. Electronically Signed   By: Marlan Palauharles  Clark M.D.   On: 11/04/2016 13:53        Scheduled Meds: . amantadine  200 mg Per Tube BID  . bethanechol  10 mg Per Tube Q6H  . chlorhexidine  15 mL Mouth Rinse BID  . feeding supplement (PRO-STAT SUGAR FREE 64)  30 mL Per Tube Daily  . free water  100 mL Per Tube Q8H  . heparin subcutaneous  5,000 Units Subcutaneous Q8H  . mouth rinse  15 mL Mouth Rinse q12n4p  . pantoprazole sodium  40 mg Per Tube Q1200  . polyethylene glycol  17 g Oral Daily  . scopolamine  1 patch Transdermal Q72H  . senna-docusate  1 tablet Oral BID   Continuous Infusions: . dextrose 5 % and 0.45% NaCl 10 mL/hr at 11/03/16 1457  . feeding supplement (JEVITY 1.2 CAL) 1,000 mL (11/04/16 1731)     LOS: 32 days    Edina Winningham Jaynie CollinsPrasad Laquia Rosano, MD [facility] Pager [phone]  If 7PM-7AM, please contact night-coverage [email or web address] Password [email or web address] 11/05/2016, 1:01 PM

## 2016-11-05 NOTE — Care Management Important Message (Signed)
Important Message  Patient Details  Name: Ricardo JerichoViviana Millington MRN: [id number] Date of Birth: 01-May-1976   Medicare Important Message Given:  Yes    Larina Lieurance Stefan ChurchBratton 11/05/2016, 3:40 PM

## 2016-11-06 LAB — GLUCOSE, CAPILLARY
GLUCOSE-CAPILLARY: 123 mg/dL — AB (ref 65–99)
GLUCOSE-CAPILLARY: 114 mg/dL — AB (ref 65–99)
GLUCOSE-CAPILLARY: 140 mg/dL — AB (ref 65–99)
Glucose-Capillary: 119 mg/dL — ABNORMAL HIGH (ref 65–99)
Glucose-Capillary: 131 mg/dL — ABNORMAL HIGH (ref 65–99)

## 2016-11-06 MED ORDER — DIPHENHYDRAMINE HCL 50 MG/ML IJ SOLN
INTRAMUSCULAR | Status: AC
  Administered 2016-11-06: 50 mg
  Filled 2016-11-06: qty 1

## 2016-11-06 NOTE — Progress Notes (Signed)
Palliative:  I came by Chelli's room and her mother is at bedside. I spoke with her briefly but mainly offered emotional support. She is tearful and telling me that her daughter is not doing any better. She tells me that family has plans to discuss with CSW today for placement. Husband not at bedside. Will call him for follow up today or tomorrow. Seems they will need time and likely outpatient palliative follow up.   No charge  Yong ChannelAlicia Taylan Marez, NP Palliative Medicine Team Pager # [phone] (M-F 8a-5p) Team Phone # [phone] (Nights/Weekends)

## 2016-11-06 NOTE — Progress Notes (Signed)
RT changed trach to a 6 CFLS per MD order. Patient was 13 overdue for trach change with sutures still in place. Area under trach looked infected. RT called RN to inform her that patient may wound care consult.

## 2016-11-06 NOTE — Progress Notes (Signed)
PROGRESS NOTE    Anne Olsen  ONG:[id number] DOB: [birth year] DOA: 10/04/2016 PCP: No primary care provider on file.   Brief Narrative: 41 year old female with history of hypertension who was presented to [facility] on 10/04/2016 for headache, weakness and altered mental status. CT scan of head revealed an acute intraparenchymal hemorrhage in the right thalamus with hematoma. Patient was seen by neurosurgery and admitted in PCCM. Patient with prolonged hospitalization requiring mechanical ventilation. Patient is now on trach collar and has PEG tube. VP shunt was placed by neurosurgery. Patient was transferred from [facility] to [facility] on 11/04/2016, on 31th day of inpatient hospitalization.  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 -Follow-up neurosurgery for further care. -Patient is unresponsive. No change in mental status  #Acute on chronic hypoxic respiratory failure: -Currently has trach collar. No sign of bleeding. As per Child psychotherapist, the tracheostomy tube has to be cuffless before discharge to nursing home. Respiratory team is consulted. They're already following. Continue frequent suctioning. -Currently on 6 L, 28% FiO2. -Continue supportive care.  #Acute febrile illness: Patient with intermittent elevated temperature likely central origin. She has been afebrile for more than 24 hours. -Neurosurgeon do not take it is related with VP shunt. UA negative for UTI. Chest x-ray improving with no pneumonia.  -Blood culture negative to date - ID consult appreciated. Continue follow-up recommendation.  -Blood pressure acceptable.  #Hypoglycemia: Blood sugar level acceptable now. Continue tube feed.   # Goals of care discussion:  -I had a long discussion with the patient husband and patient's mother on 2/25. Also discussed with the palliative care service. Patient is  DO NOT RESUSCITATE.  We will continue current medical treatment. Social worker consulted for the evaluation of SNF or LTAC for safe discharge planning. Discussed with social worker today.   Active Problems:   ICH (intracerebral hemorrhage) (HCC)   Hemorrhagic stroke (HCC)   Acute respiratory failure with hypoxia (HCC)   Hemorrhage   Nontraumatic subcortical hemorrhage of left cerebral hemisphere (HCC)   Acute on chronic respiratory failure (HCC)   Cerebrovascular accident (CVA) due to thrombosis of precerebral artery (HCC)   Tracheostomy care (HCC)   S/P ventriculoperitoneal shunt   PEG (percutaneous endoscopic gastrostomy) status (HCC)   Urinary retention   Hypoglycemia   Fever   Goals of care, counseling/discussion   Palliative care by specialist   DVT prophylaxis: SCD. No anticoagulation because of intracranial hemorrhage Code Status: Full code Family Communication: Discussed with the patient's mother at bedside today. Disposition Plan: Likely discharge to SNF or LTAC in 1-2 days when patient is medically stable   Consultants:   Neurosurgery  Pulmonary critical care  Palliative care  Infectious disease  Procedures:' 1/25 - Admit with AMS, intubated  CT HEAD CODE STROKE 1/25 >Acute intraparenchymal hemorrhage in the right thalamus with hematoma measuring 6.9 mL. Intraventricular penetration. Small amount of subarachnoid penetration. CTA of the neck and head 1/25 >do not show any atherosclerotic narrowing. The vessels are somewhat tortuous suggesting hypertension. No sign of aneurysm or vascular malformation to explain the right thalamic hemorrhage. CT HEAD 1/26 >decreased volume of right thalamic hemorrhage, intraventricular and subarachnoid hemorrhage, progressive hydrocephalus EEG 1/31>This EEG is consistent with a focal area of cerebral dysfunction in the right hemisphere superimposed on a generalized nonspecific cerebral dysfunction as can be seen with medication effect among other  causes. ECHO>55-60% 1/26 - IVC drain placed  1/31 -  PEG/Tra 2/04 - 24/7 ATC 2/21 - EVD remains in place, plan for OR today at 1430 for shunt placement.  2/23 - EVD in place. No Change in Neuro status   Antimicrobials: MRSA PCR 1/25: Negative Blood Cultures x2 2/1: Negative Urine Culture 2/8: Klebsiella Blood Cultures x2 2/8: Negative Tracheal Aspirate Culture 2/8: Negative  CSF Culture 12/12: Negative CSF Culture 12/18 >>>  ANTIBIOTICS: Unasyn 1/26 - 2/1 Rocephin 2/8 - 2/14  LINES/TUBES: OETT 7.5 1/24 >1/31 Perc trach (DF) 1/31> PEG 1/31 >  Subjective: The patient was seen and examined at bedside. No changes in mental status. Patient is unresponsive. No significant clinical change. Patient's mother at bedside  Objective: Vitals:   11/06/16 0211 11/06/16 0614 11/06/16 0910 11/06/16 0912  BP: 129/87 139/86  132/89  Pulse: 74 68 67 68  Resp: 20 (!) 22 20 20   Temp: 99.9 F (37.7 C) 99.7 F (37.6 C)  98.2 F (36.8 C)  TempSrc: Axillary Axillary  Axillary  SpO2: 94% 100% 95% 94%  Weight:      Height:        Intake/Output Summary (Last 24 hours) at 11/06/16 1054 Last data filed at 11/06/16 0960  Gross per 24 hour  Intake              200 ml  Output                0 ml  Net              200 ml   Filed Weights   11/02/16 0600 11/03/16 0500 11/05/16 0335  Weight: 65.7 kg (144 lb 13.5 oz) 68.4 kg (150 lb 12.7 oz) 72.1 kg (159 lb)    Examination:  General exam: Ill-looking unresponsive female lying on bed.Unchanged HEENT: Trach collar with no sign of discharge or bleeding  Respiratory system: Clear bilaterally, no wheezing Cardiovascular system: S1 & S2 heard, RRR.  trace pedal edema. Gastrointestinal system: Abdomen is soft, bowel sounds positive. PEG tube site clean. Central nervous system: Unresponsive Skin: No rashes, lesions or ulcers Psychiatry: Unable to assess as patient is unresponsive.     Data Reviewed: I have personally reviewed following  labs and imaging studies  CBC:  Recent Labs Lab 11/01/16 0339 11/03/16 0612 11/04/16 1039 11/05/16 0708  WBC 15.2* 8.9 10.8* 11.0*  HGB 14.4 12.3 12.3 12.8  HCT 44.2 39.0 38.1 39.0  MCV 91.1 92.6 91.1 89.4  PLT 254 182 170 203   Basic Metabolic Panel:  Recent Labs Lab 11/01/16 0339 11/03/16 0612 11/04/16 1039 11/05/16 0708  NA 141 140 133* 132*  K 4.6 4.4 5.1 5.0  CL 107 111 108 104  CO2 24 22 17* 22  GLUCOSE 120* 147* 116* 109*  BUN 25* 26* 13 10  CREATININE 0.85 0.91 0.70 0.72  CALCIUM 9.5 8.2* 8.3* 8.4*  MG 1.9  --   --   --   PHOS 3.4  --   --   --    GFR: Estimated Creatinine Clearance: 86.5 mL/min (by C-G formula based on SCr of 0.72 mg/dL). Liver Function Tests: No results for input(s): AST, ALT, ALKPHOS, BILITOT, PROT, ALBUMIN in the last 168 hours. No results for input(s): LIPASE, AMYLASE in the last 168 hours. No results for input(s): AMMONIA in the last 168 hours. Coagulation Profile: No results for input(s): INR, PROTIME in the last 168 hours. Cardiac Enzymes: No results for input(s): CKTOTAL, CKMB, CKMBINDEX, TROPONINI in the last 168 hours. BNP (last 3 results) No  results for input(s): PROBNP in the last 8760 hours. HbA1C: No results for input(s): HGBA1C in the last 72 hours. CBG:  Recent Labs Lab 11/05/16 1620 11/05/16 2030 11/05/16 2355 11/06/16 0354 11/06/16 0805  GLUCAP 139* 131* 126* 123* 114*   Lipid Profile: No results for input(s): CHOL, HDL, LDLCALC, TRIG, CHOLHDL, LDLDIRECT in the last 72 hours. Thyroid Function Tests: No results for input(s): TSH, T4TOTAL, FREET4, T3FREE, THYROIDAB in the last 72 hours. Anemia Panel: No results for input(s): VITAMINB12, FOLATE, FERRITIN, TIBC, IRON, RETICCTPCT in the last 72 hours. Sepsis Labs: No results for input(s): PROCALCITON, LATICACIDVEN in the last 168 hours.  Recent Results (from the past 240 hour(s))  CSF culture with Stat gram stain     Status: None   Collection Time: 10/28/16   6:56 AM  Result Value Ref Range Status   Specimen Description CSF  Final   Special Requests IN SYRINGE  Final   Gram Stain   Final    CYTOSPIN SMEAR WBC PRESENT, PREDOMINANTLY MONONUCLEAR NO ORGANISMS SEEN    Culture NO GROWTH 3 DAYS  Final   Report Status 10/31/2016 FINAL  Final  CSF culture with Stat gram stain     Status: None   Collection Time: 11/01/16 10:55 AM  Result Value Ref Range Status   Specimen Description CSF  Final   Special Requests 1CC SPECIMEN CUP C  Final   Gram Stain   Final    WBC PRESENT,BOTH PMN AND MONONUCLEAR NO ORGANISMS SEEN CYTOSPIN SMEAR    Culture NO GROWTH 3 DAYS  Final   Report Status 11/04/2016 FINAL  Final  Culture, blood (routine x 2)     Status: None (Preliminary result)   Collection Time: 11/04/16 10:45 AM  Result Value Ref Range Status   Specimen Description BLOOD BLOOD RIGHT HAND  Final   Special Requests BOTTLES DRAWN AEROBIC ONLY 5CC  Final   Culture NO GROWTH 2 DAYS  Final   Report Status PENDING  Incomplete  Culture, blood (routine x 2)     Status: None (Preliminary result)   Collection Time: 11/04/16 10:48 AM  Result Value Ref Range Status   Specimen Description BLOOD BLOOD RIGHT HAND  Final   Special Requests BOTTLES DRAWN AEROBIC ONLY 5CC  Final   Culture NO GROWTH 2 DAYS  Final   Report Status PENDING  Incomplete         Radiology Studies: Dg Chest Port 1 View  Result Date: 11/04/2016 CLINICAL DATA:  Fever.  VP shunt insertion 11/02/2016 EXAM: PORTABLE CHEST 1 VIEW COMPARISON:  11/01/2016 FINDINGS: Tracheostomy remains in good position. Improved aeration in the lung bases with decreased airspace disease. Mild residual right lower lobe airspace disease. No effusion Interval placement of ventricular peritoneal shunt tubing overlying the right chest and abdomen. IMPRESSION: Tracheostomy remains in good position Improved aeration in the lung bases compared with the prior study. Electronically Signed   By: Marlan Palauharles  Clark M.D.    On: 11/04/2016 13:53        Scheduled Meds: . amantadine  200 mg Per Tube BID  . bethanechol  10 mg Per Tube Q6H  . chlorhexidine  15 mL Mouth Rinse BID  . diphenhydrAMINE      . feeding supplement (PRO-STAT SUGAR FREE 64)  30 mL Per Tube Daily  . free water  100 mL Per Tube Q8H  . heparin subcutaneous  5,000 Units Subcutaneous Q8H  . mouth rinse  15 mL Mouth Rinse q12n4p  . pantoprazole sodium  40 mg Per Tube Q1200  . polyethylene glycol  17 g Oral Daily  . scopolamine  1 patch Transdermal Q72H  . senna-docusate  1 tablet Oral BID   Continuous Infusions: . feeding supplement (JEVITY 1.2 CAL) 1,000 mL (11/05/16 1538)     LOS: 33 days    Anne Roeder Jaynie Collins, MD [facility] Pager [phone]  If 7PM-7AM, please contact night-coverage [email or web address] Password [facility] 11/06/2016, 10:54 AM

## 2016-11-06 NOTE — Progress Notes (Signed)
Palliative:  I came by Anne Olsen's room twice today but no family at bedside. No changes in condition. Will try again tomorrow.   No charge  Yong ChannelAlicia Azavier Creson, NP Palliative Medicine Team Pager # [phone] (M-F 8a-5p) Team Phone # [phone] (Nights/Weekends)

## 2016-11-06 NOTE — Progress Notes (Signed)
[facility]    Date of Admission:  10/04/2016   Total days of antibiotics 0          ID: Anne Olsen is a 41 y.o. female with HTN who intially presented 10/04/16 with acute parenchymal hemorrhage of right thalamus and brainstem with extension to the fourth ventricle and subarachnoid space.  She had a prolonged hospitalization and currently is with a trach, PEG, and required external shunt and 2/23 had right ventriculoperitoneal shunt placed.  Active Problems:   ICH (intracerebral hemorrhage) (HCC)   Hemorrhagic stroke (HCC)   Acute respiratory failure with hypoxia (HCC)   Hemorrhage   Nontraumatic subcortical hemorrhage of left cerebral hemisphere (HCC)   Acute on chronic respiratory failure (HCC)   Cerebrovascular accident (CVA) due to thrombosis of precerebral artery (HCC)   Tracheostomy care (HCC)   S/P ventriculoperitoneal shunt   PEG (percutaneous endoscopic gastrostomy) status (HCC)   Urinary retention   Hypoglycemia   Fever   Goals of care, counseling/discussion   Palliative care by specialist    Subjective: Afebrile despite no abtx Medications:  . amantadine  200 mg Per Tube BID  . bethanechol  10 mg Per Tube Q6H  . chlorhexidine  15 mL Mouth Rinse BID  . feeding supplement (PRO-STAT SUGAR FREE 64)  30 mL Per Tube Daily  . free water  100 mL Per Tube Q8H  . heparin subcutaneous  5,000 Units Subcutaneous Q8H  . mouth rinse  15 mL Mouth Rinse q12n4p  . pantoprazole sodium  40 mg Per Tube Q1200  . polyethylene glycol  17 g Oral Daily  . scopolamine  1 patch Transdermal Q72H  . senna-docusate  1 tablet Oral BID    Objective: Vital signs in last 24 hours: Temp:  [98 F (36.7 C)-99.9 F (37.7 C)] 98.2 F (36.8 C) (02/27 0912) Pulse Rate:  [67-76] 68 (02/27 0912) Resp:  [20-22] 20 (02/27 0912) BP: (122-139)/(80-98) 132/89 (02/27 0912) SpO2:  [92 %-100 %] 94 % (02/27 0912) FiO2 (%):  [28 %] 28 % (02/27 0912) Physical Exam    Constitutional:  Non responsive,. appears well-developed and well-nourished. No distress.  HENT: right temporal-occipital scalp staples, no scleral icterus Mouth/Throat: Oropharynx is clear and moist. No oropharyngeal exudate.  Neck = trach in place with yellowish phlegm Cardiovascular: Normal rate, regular rhythm and normal heart sounds. Exam reveals no gallop and no friction rub.  No murmur heard.  Pulmonary/Chest: Effort normal and breath sounds normal. No respiratory distress.  has no wheezes.  Neck = supple, no nuchal rigidity Abdominal: Soft. Bowel sounds are normal.  exhibits no distension. There is no tenderness. Right upper quadrant surgical incision c/d/i. Peg in place  Lymphadenopathy: no cervical adenopathy. No axillary adenopathy Neurological: alert and oriented to person, place, and time.  Skin: Skin is warm and dry. No rash noted. No erythema.  Psychiatric: a normal mood and affect.  behavior is normal.    Lab Results  Recent Labs  11/04/16 1039 11/05/16 0708  WBC 10.8* 11.0*  HGB 12.3 12.8  HCT 38.1 39.0  NA 133* 132*  K 5.1 5.0  CL 108 104  CO2 17* 22  BUN 13 10  CREATININE 0.70 0.72    Microbiology: 2/25 blood cx ngtd 2/22 csf cx ngtd  Assessment/Plan: Recurrent fevers = appears to have resolved. Due to hx of ICH, she is at risk for central fevers. No new sources ot treat. Fever curve improved without further intervention  Will  sign off.  Drue [facility] Cell: [phone] Pager: [phone]  11/06/2016, 11:34 AM

## 2016-11-06 NOTE — Care Management Note (Signed)
Case Management Note  Patient Details  Name: Ricardo JerichoViviana Richter MRN: [id number] Date of Birth: Dec 08, 1975  Subjective/Objective:                    Action/Plan: Plan is for SNF when medically ready. Pt does not meet criteria for LTACH per CM physician advisor. CM following.  Expected Discharge Date:                  Expected Discharge Plan:  Skilled Nursing Facility  In-House Referral:  Clinical Social Work  Discharge planning Services  CM Consult  Post Acute Care Choice:    Choice offered to:     DME Arranged:    DME Agency:     HH Arranged:    HH Agency:     Status of Service:  In process, will continue to follow  If discussed at Long Length of Stay Meetings, dates discussed:    Additional Comments:  Kermit BaloKelli F Jesten Cappuccio, RN 11/06/2016, 4:14 PM

## 2016-11-06 NOTE — Clinical Social Work Note (Signed)
Clinical Social Work Assessment  Patient Details  Name: Anne Olsen MRN: [id number] Date of Birth: 1975-10-26  Date of referral:  11/06/16               Reason for consult:  Facility Placement                Permission sought to share information with:  [facility] Contact Representative Permission granted to share information::  No (pt disoriented)  Name::     Anne Olsen  Agency::  SNF  Relationship::  spouse  Contact Information:     Housing/Transportation Living arrangements for the past 2 months:  Single Family Home Source of Information:  Spouse Patient Interpreter Needed:  None Criminal Activity/Legal Involvement Pertinent to Current Situation/Hospitalization:  No - Comment as needed Significant Relationships:  Spouse, Parents Lives with:  Spouse Do you feel safe going back to the place where you live?  No Need for family participation in patient care:  Yes (Comment) (decision making)  Care giving concerns:  Pt lives at home with spouse but is now complete care and pt spouse works.   Social Worker assessment / plan:  CSW spoke with pt spouse regarding MD/PT recommendation for SNF.  Explained SNF and SNF referral process.   CSW explained that due to pt poor prognosis he would need to pursue disability- informed him of SSI website and instructed him to look into applying for her so she can get disability/medicaid  Employment status:    Insurance information:  Managed Care PT Recommendations:  Skilled Nursing Facility Information / Referral to community resources:  Skilled Nursing Facility  Patient/Family's Response to care:  Spouse very involved with care and is agreeable to SNF  Patient/Family's Understanding of and Emotional Response to Diagnosis, Current Treatment, and Prognosis:  Spouse having a hard time working through patients drastic change following stroke- understands she needs placement but was uncomfortable discussing patient possible need for longer term  care.  Emotional Assessment Appearance:  Appears stated age Attitude/Demeanor/Rapport:  Unresponsive Affect (typically observed):  Unable to Assess Orientation:   (unresponsive) Alcohol / Substance use:  Not Applicable Psych involvement (Current and /or in the community):  No (Comment)  Discharge Needs  Concerns to be addressed:  Care Coordination Readmission within the last 30 days:  No Current discharge risk:  Physical Impairment Barriers to Discharge:  Continued Medical Work up   Burna SisUris, Gerrod Maule H, LCSW 11/06/2016, 4:29 PM

## 2016-11-07 DIAGNOSIS — Z515 Encounter for palliative care: Secondary | ICD-10-CM

## 2016-11-07 LAB — GLUCOSE, CAPILLARY
GLUCOSE-CAPILLARY: 121 mg/dL — AB (ref 65–99)
GLUCOSE-CAPILLARY: 122 mg/dL — AB (ref 65–99)
GLUCOSE-CAPILLARY: 86 mg/dL (ref 65–99)
Glucose-Capillary: 126 mg/dL — ABNORMAL HIGH (ref 65–99)
Glucose-Capillary: 132 mg/dL — ABNORMAL HIGH (ref 65–99)
Glucose-Capillary: 138 mg/dL — ABNORMAL HIGH (ref 65–99)

## 2016-11-07 MED ORDER — GLYCOPYRROLATE 0.2 MG/ML IJ SOLN
0.2000 mg | INTRAMUSCULAR | Status: DC | PRN
  Administered 2016-11-08 – 2016-11-09 (×2): 0.2 mg via INTRAVENOUS
  Filled 2016-11-07 (×2): qty 1

## 2016-11-07 MED ORDER — BETHANECHOL CHLORIDE 10 MG PO TABS
10.0000 mg | ORAL_TABLET | Freq: Four times a day (QID) | ORAL | Status: DC
  Administered 2016-11-07 – 2016-11-15 (×33): 10 mg
  Filled 2016-11-07 (×33): qty 1

## 2016-11-07 NOTE — Progress Notes (Signed)
                                                                                                                                                                                                         Daily Progress Note   Patient Name: Anne Olsen       Date: 11/07/2016 DOB: 10/12/1975  Age: 40 y.o. MRN#: [id number] Attending Physician: Dron Prasad Bhandari, MD Primary Care Physician: No primary care provider on file. Admit Date: 10/04/2016  Reason for Consultation/Follow-up: Establishing goals of care and Psychosocial/spiritual support  Subjective: Anne Olsen appears somewhat restless today.   Length of Stay: 34  Current Medications: Scheduled Meds:  . amantadine  200 mg Per Tube BID  . bethanechol  10 mg Per Tube Q6H  . chlorhexidine  15 mL Mouth Rinse BID  . feeding supplement (PRO-STAT SUGAR FREE 64)  30 mL Per Tube Daily  . free water  100 mL Per Tube Q8H  . heparin subcutaneous  5,000 Units Subcutaneous Q8H  . mouth rinse  15 mL Mouth Rinse q12n4p  . pantoprazole sodium  40 mg Per Tube Q1200  . polyethylene glycol  17 g Oral Daily  . scopolamine  1 patch Transdermal Q72H  . senna-docusate  1 tablet Oral BID    Continuous Infusions: . feeding supplement (JEVITY 1.2 CAL) 1,000 mL (11/05/16 1538)    PRN Meds: sodium chloride, acetaminophen **OR** acetaminophen, acetaminophen, bisacodyl, glycopyrrolate, ipratropium-albuterol, labetalol  Physical Exam  Constitutional: She appears well-developed. She has a sickly appearance. She is not intubated.  Cardiovascular: Normal rate.   Pulmonary/Chest: No accessory muscle usage. No tachypnea. She is not intubated. No respiratory distress. She has rhonchi.  Abdominal: Soft.  + PEG  Neurological: She is unresponsive.  Restless at times but nonpurposeful movement  Nursing note and vitals reviewed.           Vital Signs: BP (!) 136/94   Pulse 91   Temp 99.1 F (37.3 C) (Axillary)   Resp 20   Ht 5' 1" (1.549 m)   Wt 69.7  kg (153 lb 9.6 oz)   LMP  (LMP Unknown) Comment: neg preg test 10/04/16  SpO2 97%   BMI 29.02 kg/m  SpO2: SpO2: 97 % O2 Device: O2 Device: Tracheostomy Collar O2 Flow Rate: O2 Flow Rate (L/min): 6 L/min  Intake/output summary:  Intake/Output Summary (Last 24 hours) at 11/07/16 1354 Last data filed at 11/07/16 0900  Gross per 24 hour  Intake                0   ml  Output                0 ml  Net                0 ml   LBM: Last BM Date: 11/02/16 Baseline Weight: Weight: 70 kg (154 lb 5.2 oz) (ESTIMATED) Most recent weight: Weight: 69.7 kg (153 lb 9.6 oz)       Palliative Assessment/Data:    Flowsheet Rows   Flowsheet Row Most Recent Value  Intake Tab  Referral Department  Hospitalist  Unit at Time of Referral  Med/Surg Unit  Palliative Care Primary Diagnosis  Neurology  Date Notified  11/04/16  Palliative Care Type  New Palliative care  Reason for referral  Clarify Goals of Care  Date of Admission  10/04/16  Date first seen by Palliative Care  11/04/16  # of days Palliative referral response time  0 Day(s)  # of days IP prior to Palliative referral  31  Clinical Assessment  Psychosocial & Spiritual Assessment  Palliative Care Outcomes      Patient Active Problem List   Diagnosis Date Noted  . Fever   . Goals of care, counseling/discussion   . Palliative care by specialist   . PEG (percutaneous endoscopic gastrostomy) status ([place]) 11/03/2016  . Urinary retention 11/03/2016  . Hypoglycemia 11/03/2016  . S/P ventriculoperitoneal shunt 11/02/2016  . Tracheostomy care ([place])   . Cerebrovascular accident (CVA) due to thrombosis of precerebral artery ([place])   . Acute on chronic respiratory failure ([place])   . Nontraumatic subcortical hemorrhage of left cerebral hemisphere ([place])   . Hemorrhage   . Acute respiratory failure with hypoxia ([place])   . Hemorrhagic stroke ([place])   . ICH (intracerebral hemorrhage) ([place]) 10/04/2016    Palliative Care Assessment & Plan   HPI: 41  y.o. female  with past medical history of HTN who presented to the ED with headache, weakness, and altered mental status. She was subsequently admitted on 10/04/2016 with an acute intraparenchymal hemorrhage in the thalamus with associated hematoma and intraventricular and subarachnoid penetration. She required intubation, and is now s/p trach and PEG. She also had progressive hydrocephalus with subsequent placement of ventriculostomy drain, then right ventriculoperitoneal shunt. Neurologically she is unresponsive. Palliative consulted to assist in determining goals of care.   Assessment: [name] is at bedside. She is tearful and upset about continuing to see her daughter in this state. She has good understanding of poor prognosis. She is beginning to talk more and more about wanting Anne Olsen to "be comfortable." She asks about next steps. I encourage her to continue speaking with Anne Olsen's husband to discuss aggressiveness of care and rehospitalization vs focusing on comfort and letting her go naturally. She understands and is appreciative of the support.   Mother says that Anne Olsen moved from [place] to [place] where she attended university for accounting and met her husband. She has been married ~2.5 years now. She had plans to become a Engineer, maintenance (IT). Her mother is proud of her intelligence and kindness and she was always active and busy.   I also spoke briefly with husband via phone. He was very friendly and feels that the medical team has been communicating well with him. He has no questions at this time and has been working with CSW on placement. He is obviously not yet at a place for full comfort care.   They will need continued palliative follow up at SNF - please recommend follow up in discharge  summary.   Recommendations/Plan:  Consider prn medication for pain as concern that her restlessness could be pain/discomfort. Consider prn oxycodone.   Goals of Care and Additional  Recommendations:  Limitations on Scope of Treatment: Full Scope Treatment outside DNR  Code Status:  DNR  Prognosis:   Unable to determine  Discharge Planning:  SNF with palliative follow up  Thank you for allowing the Palliative Medicine Team to assist in the care of this patient.   Total Time 28mn Prolonged Time Billed  no       Greater than 50%  of this time was spent counseling and coordinating care related to the above assessment and plan.  AVinie Sill NP Palliative Medicine Team Pager # [phone](M-F 8a-5p) Team Phone # [phone](Nights/Weekends)

## 2016-11-07 NOTE — Progress Notes (Signed)
Trach care done per RRT. Site was flare red inflamed looking. Site was cleansed with NS, dried, and protective foam pad skin barrier applied under the trach flanged. Janina Mayorach ties were very soiled and foul smelling. Janina Mayorach ties changed. Trach collar was soiled with secretions. Trach collar changed. Pt tolerated trach tie changed, and trach care well no distress or complications noted. Pt is stable at this time SATs are 96% on 28% FiO2 ATC delivery.

## 2016-11-07 NOTE — Progress Notes (Addendum)
CSW provided list of bed offers to patient spouse for review- informed spouse that patient likely ready for DC tomorrow.  CSW spoke with RN who states pt needed suctioning 3x in a 3 hour period this morning- patient will need to be suctioned no more than q4 hours before going to a SNF.  CSW discussed LTC payor source and husband plans to apply for SSI Disability in case patient needing longer term care.  CSW will continue to follow  Burna SisJenna H. Terra Aveni, LCSW Clinical Social Worker [phone]

## 2016-11-07 NOTE — Progress Notes (Signed)
PROGRESS NOTE    Anne Olsen  ZOX:[id number] DOB: [birth year] DOA: 10/04/2016 PCP: No primary care provider on file.   Brief Narrative: 41 year old female with history of hypertension who was presented to [facility] on 10/04/2016 for headache, weakness and altered mental status. CT scan of head revealed an acute intraparenchymal hemorrhage in the right thalamus with hematoma. Patient was seen by neurosurgery and admitted in PCCM. Patient with prolonged hospitalization requiring mechanical ventilation. Patient is now on trach collar and has PEG tube. VP shunt was placed by neurosurgery. Patient was transferred from [facility] to [facility] on 11/04/2016, on 31th day of inpatient hospitalization.  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 -Follow-up neurosurgery for further care. -Patient is unresponsive. No change in mental status  #Acute on chronic hypoxic respiratory failure: -Currently has trach collar. No sign of bleeding. The tracheostomy tube was changed by respiratory on 2/27. Patient is requiring frequent suctioning. I will increase the dose of glycopyrrolate to 0.2 mg. -Currently on 6 L, 28% FiO2. -Continue supportive care.  #Acute febrile illness: Patient with intermittent elevated temperature likely central origin. She has been afebrile for more than 24 hours. -Neurosurgeon do not take it is related with VP shunt. UA negative for UTI. Chest x-ray improving with no pneumonia.  -Blood culture negative to date - ID consult appreciated. No further recommendation. -Blood pressure acceptable.  #Hypoglycemia: Improved now. Blood sugar level acceptable now. Continue tube feed.   # Goals of care discussion:  -I had a long discussion with the patient husband and patient's mother on 2/25. Also discussed with the palliative care service. Patient is  DO NOT RESUSCITATE. We will continue current  medical treatment. Social worker consulted for the evaluation of SNF or LTAC for safe discharge planning. Discussed with the social worker today. Patient will likely discharge to SNF tomorrow if bed is available. Also require less frequent suctioning. Active Problems:   ICH (intracerebral hemorrhage) (HCC)   Hemorrhagic stroke (HCC)   Acute respiratory failure with hypoxia (HCC)   Hemorrhage   Nontraumatic subcortical hemorrhage of left cerebral hemisphere (HCC)   Acute on chronic respiratory failure (HCC)   Cerebrovascular accident (CVA) due to thrombosis of precerebral artery (HCC)   Tracheostomy care (HCC)   S/P ventriculoperitoneal shunt   PEG (percutaneous endoscopic gastrostomy) status (HCC)   Urinary retention   Hypoglycemia   Fever   Goals of care, counseling/discussion   Palliative care by specialist   DVT prophylaxis: SCD. No anticoagulation because of intracranial hemorrhage Code Status: Full code Family Communication: Discussed with the patient's mother at bedside today. Disposition Plan: Likely discharge to SNF or LTAC in 1-2 days when patient is medically stable   Consultants:   Neurosurgery  Pulmonary critical care  Palliative care  Infectious disease  Procedures:' 1/25 - Admit with AMS, intubated  CT HEAD CODE STROKE 1/25 >Acute intraparenchymal hemorrhage in the right thalamus with hematoma measuring 6.9 mL. Intraventricular penetration. Small amount of subarachnoid penetration. CTA of the neck and head 1/25 >do not show any atherosclerotic narrowing. The vessels are somewhat tortuous suggesting hypertension. No sign of aneurysm or vascular malformation to explain the right thalamic hemorrhage. CT HEAD 1/26 >decreased volume of right thalamic hemorrhage, intraventricular and subarachnoid hemorrhage, progressive hydrocephalus EEG 1/31>This EEG is consistent with a focal area of cerebral dysfunction in the right hemisphere superimposed on a generalized  nonspecific cerebral dysfunction as can be seen with  medication effect among other causes. ECHO>55-60% 1/26 - IVC drain placed  1/31 - PEG/Tra 2/04 - 24/7 ATC 2/21 - EVD remains in place, plan for OR today at 1430 for shunt placement.  2/23 - EVD in place. No Change in Neuro status   Antimicrobials: MRSA PCR 1/25: Negative Blood Cultures x2 2/1: Negative Urine Culture 2/8: Klebsiella Blood Cultures x2 2/8: Negative Tracheal Aspirate Culture 2/8: Negative  CSF Culture 12/12: Negative CSF Culture 12/18 >>>  ANTIBIOTICS: Unasyn 1/26 - 2/1 Rocephin 2/8 - 2/14  LINES/TUBES: OETT 7.5 1/24 >1/31 Perc trach (DF) 1/31> PEG 1/31 >  Subjective: The patient was seen and examined at bedside. No changes in mental status. No new event. Review of systems Limited. Patient's mother at bedside Objective: Vitals:   11/07/16 0322 11/07/16 0401 11/07/16 0856 11/07/16 0930  BP:  (!) 137/92  (!) 136/94  Pulse: 79 81 99 84  Resp: (!) 21 18 20 18   Temp:  98.1 F (36.7 C)  99.1 F (37.3 C)  TempSrc:  Axillary  Axillary  SpO2: 99% 96% 98% 92%  Weight:  69.7 kg (153 lb 9.6 oz)    Height:        Intake/Output Summary (Last 24 hours) at 11/07/16 1217 Last data filed at 11/07/16 0900  Gross per 24 hour  Intake                0 ml  Output                0 ml  Net                0 ml   Filed Weights   11/03/16 0500 11/05/16 0335 11/07/16 0401  Weight: 68.4 kg (150 lb 12.7 oz) 72.1 kg (159 lb) 69.7 kg (153 lb 9.6 oz)    Examination:  General exam: Ill-looking unresponsive female lying on bed.Unchanged HEENT: Trach collar with no sign of discharge or bleeding. She has increased secretions Respiratory system: Coarse breath sound bilateral, no wheezing Cardiovascular system: S1 & S2 heard, RRR.  trace pedal edema. Gastrointestinal system: Abdomen is soft, bowel sounds positive. PEG tube site clean. Central nervous system: Unresponsive Skin: No rashes, lesions or ulcers Psychiatry:  Unable to assess as patient is unresponsive.     Data Reviewed: I have personally reviewed following labs and imaging studies  CBC:  Recent Labs Lab 11/01/16 0339 11/03/16 0612 11/04/16 1039 11/05/16 0708  WBC 15.2* 8.9 10.8* 11.0*  HGB 14.4 12.3 12.3 12.8  HCT 44.2 39.0 38.1 39.0  MCV 91.1 92.6 91.1 89.4  PLT 254 182 170 203   Basic Metabolic Panel:  Recent Labs Lab 11/01/16 0339 11/03/16 0612 11/04/16 1039 11/05/16 0708  NA 141 140 133* 132*  K 4.6 4.4 5.1 5.0  CL 107 111 108 104  CO2 24 22 17* 22  GLUCOSE 120* 147* 116* 109*  BUN 25* 26* 13 10  CREATININE 0.85 0.91 0.70 0.72  CALCIUM 9.5 8.2* 8.3* 8.4*  MG 1.9  --   --   --   PHOS 3.4  --   --   --    GFR: Estimated Creatinine Clearance: 85.2 mL/min (by C-G formula based on SCr of 0.72 mg/dL). Liver Function Tests: No results for input(s): AST, ALT, ALKPHOS, BILITOT, PROT, ALBUMIN in the last 168 hours. No results for input(s): LIPASE, AMYLASE in the last 168 hours. No results for input(s): AMMONIA in the last 168 hours. Coagulation Profile: No results for input(s): INR,  PROTIME in the last 168 hours. Cardiac Enzymes: No results for input(s): CKTOTAL, CKMB, CKMBINDEX, TROPONINI in the last 168 hours. BNP (last 3 results) No results for input(s): PROBNP in the last 8760 hours. HbA1C: No results for input(s): HGBA1C in the last 72 hours. CBG:  Recent Labs Lab 11/06/16 2004 11/07/16 0036 11/07/16 0357 11/07/16 0731 11/07/16 1148  GLUCAP 140* 122* 126* 132* 138*   Lipid Profile: No results for input(s): CHOL, HDL, LDLCALC, TRIG, CHOLHDL, LDLDIRECT in the last 72 hours. Thyroid Function Tests: No results for input(s): TSH, T4TOTAL, FREET4, T3FREE, THYROIDAB in the last 72 hours. Anemia Panel: No results for input(s): VITAMINB12, FOLATE, FERRITIN, TIBC, IRON, RETICCTPCT in the last 72 hours. Sepsis Labs: No results for input(s): PROCALCITON, LATICACIDVEN in the last 168 hours.  Recent Results  (from the past 240 hour(s))  CSF culture with Stat gram stain     Status: None   Collection Time: 11/01/16 10:55 AM  Result Value Ref Range Status   Specimen Description CSF  Final   Special Requests 1CC SPECIMEN CUP C  Final   Gram Stain   Final    WBC PRESENT,BOTH PMN AND MONONUCLEAR NO ORGANISMS SEEN CYTOSPIN SMEAR    Culture NO GROWTH 3 DAYS  Final   Report Status 11/04/2016 FINAL  Final  Culture, blood (routine x 2)     Status: None (Preliminary result)   Collection Time: 11/04/16 10:45 AM  Result Value Ref Range Status   Specimen Description BLOOD BLOOD RIGHT HAND  Final   Special Requests BOTTLES DRAWN AEROBIC ONLY 5CC  Final   Culture NO GROWTH 2 DAYS  Final   Report Status PENDING  Incomplete  Culture, blood (routine x 2)     Status: None (Preliminary result)   Collection Time: 11/04/16 10:48 AM  Result Value Ref Range Status   Specimen Description BLOOD BLOOD RIGHT HAND  Final   Special Requests BOTTLES DRAWN AEROBIC ONLY 5CC  Final   Culture NO GROWTH 2 DAYS  Final   Report Status PENDING  Incomplete         Radiology Studies: No results found.      Scheduled Meds: . amantadine  200 mg Per Tube BID  . bethanechol  10 mg Per Tube Q6H  . chlorhexidine  15 mL Mouth Rinse BID  . feeding supplement (PRO-STAT SUGAR FREE 64)  30 mL Per Tube Daily  . free water  100 mL Per Tube Q8H  . heparin subcutaneous  5,000 Units Subcutaneous Q8H  . mouth rinse  15 mL Mouth Rinse q12n4p  . pantoprazole sodium  40 mg Per Tube Q1200  . polyethylene glycol  17 g Oral Daily  . scopolamine  1 patch Transdermal Q72H  . senna-docusate  1 tablet Oral BID   Continuous Infusions: . feeding supplement (JEVITY 1.2 CAL) 1,000 mL (11/05/16 1538)     LOS: 34 days    Dron Jaynie Collins, MD [facility] Pager [phone]  If 7PM-7AM, please contact night-coverage [email or web address] Password [facility] 11/07/2016, 12:17 PM

## 2016-11-08 ENCOUNTER — Inpatient Hospital Stay (HOSPITAL_COMMUNITY): Payer: 59

## 2016-11-08 DIAGNOSIS — J398 Other specified diseases of upper respiratory tract: Secondary | ICD-10-CM

## 2016-11-08 DIAGNOSIS — Z515 Encounter for palliative care: Secondary | ICD-10-CM

## 2016-11-08 DIAGNOSIS — R259 Unspecified abnormal involuntary movements: Secondary | ICD-10-CM

## 2016-11-08 LAB — GLUCOSE, CAPILLARY
GLUCOSE-CAPILLARY: 110 mg/dL — AB (ref 65–99)
GLUCOSE-CAPILLARY: 117 mg/dL — AB (ref 65–99)
GLUCOSE-CAPILLARY: 118 mg/dL — AB (ref 65–99)
GLUCOSE-CAPILLARY: 118 mg/dL — AB (ref 65–99)
Glucose-Capillary: 120 mg/dL — ABNORMAL HIGH (ref 65–99)
Glucose-Capillary: 129 mg/dL — ABNORMAL HIGH (ref 65–99)

## 2016-11-08 MED ORDER — LORAZEPAM 2 MG/ML IJ SOLN
1.0000 mg | INTRAMUSCULAR | Status: DC | PRN
  Administered 2016-11-08 – 2016-11-13 (×3): 1 mg via INTRAVENOUS
  Filled 2016-11-08 (×3): qty 1

## 2016-11-08 MED ORDER — LORAZEPAM 2 MG/ML IJ SOLN: 0.5000 mg | Freq: Once | INTRAMUSCULAR | Status: DC

## 2016-11-08 MED ORDER — LORAZEPAM 2 MG/ML IJ SOLN
1.0000 mg | Freq: Four times a day (QID) | INTRAMUSCULAR | Status: DC | PRN
Start: 2016-11-08 — End: 2016-11-08
  Administered 2016-11-08: 1 mg via INTRAVENOUS
  Filled 2016-11-08: qty 1

## 2016-11-08 MED ORDER — OXYCODONE HCL 5 MG PO TABS: 5.0000 mg | ORAL_TABLET | ORAL | Status: DC | PRN

## 2016-11-08 MED ORDER — DEXTROSE 5 % IV SOLN: 0.5000 mg | Freq: Once | INTRAVENOUS | Status: DC

## 2016-11-08 NOTE — Progress Notes (Signed)
Nutrition Follow-up   INTERVENTION:  Continue Initiate Jevity 1.2 @ 55 ml/hr (1320 ml/day) 30 ml Prostat daily Provides: 1684 kcal, 88 grams protein, and 1069 ml free water  100 ml free water every 8 hours  Total free water: 1369 ml    NUTRITION DIAGNOSIS:   Inadequate oral intake related to inability to eat as evidenced by NPO status.  ongoing  GOAL:   Patient will meet greater than or equal to 90% of their needs  Being met  MONITOR:   TF tolerance, Vent status, I & O's  REASON FOR ASSESSMENT:   Consult  (Change to Lowest K+ TF)  ASSESSMENT:   Pt with PMH of HTN admitted with R intraparenchymal brain hemorrhage.   Pt receiving Jevity 1.2 @ 55 ml/hr continuous. RN reports that patient has had no problems with tube feedings. Pt's weight remains fairly stable.   Labs: low sodium, low calcium  Diet Order:  Diet NPO time specified  Skin:  Reviewed, no issues  Last BM:  2/28  Height:   Ht Readings from Last 1 Encounters:  10/04/16 _0  (1.549 m)    Weight:   Wt Readings from Last 1 Encounters:  11/07/16 153 lb 9.6 oz (69.7 kg)    Ideal Body Weight:  47.7 kg  BMI:  Body mass index is 29.02 kg/m.  Estimated Nutritional Needs:   Kcal:  1600-1800  Protein:  85-100 grams  Fluid:  > 1.6 L/day  EDUCATION NEEDS:   No education needs identified at this time  Scarlette Ar RD, LDN, CSP Inpatient Clinical Dietitian Pager: [phone] After Hours Pager: [phone]

## 2016-11-08 NOTE — Progress Notes (Signed)
CSW spoke with pt spouse concerning bed offers- they prefer [facility] but would like to know how much they would need to pay out of pocket - CSW informed facility of family preference and they are looking into what the cost would be  CSW will continue to follow  Burna SisJenna H. Shariah Assad, LCSW Clinical Social Worker [phone]

## 2016-11-08 NOTE — Consult Note (Signed)
NEURO HOSPITALIST CONSULT NOTE   Requestig physician: internal medicine   Reason for Consult: abnormal right arm and leg movements   History obtained from: chart HPI:                                                                                                                                          Anne Olsen is an 41 y.o. female who is brought to the hospital on 10/04/2016 following sudden onset headache with nausea vomiting. She had elevated blood pressure. CT of brain showed acute parenchymal hemorrhage involving the right thalamus and brainstem with extension to the fourth ventricle as well as subarachnoid extension. CT angiogram of the head and neck was unremarkable with no signs of aneurysm or AVM. Patient has been in the hospital and treated for the intra-ventricular hemorrhage by neurosurgery with EVD. She was transferred to the floor after she progressed to be stable. Today she was noted to have abnormal movements of her right arm and leg and neurology was consult good. Upon entering the room patient she shows flailing of right arm and leg which looks very much like hemiballismus.  Patient does withdrawal to stimuli on the right arm and leg. She is also having difficulty with her airway at this time and sounds extremely rhonchorous to which nurse is now consult during primary team again. Her saturations are good at this time.  Past Medical History:  Diagnosis Date  . Hyperkalemia   . Hypertension     Past Surgical History:  Procedure Laterality Date  . ESOPHAGOGASTRODUODENOSCOPY N/A 10/10/2016   Procedure: ESOPHAGOGASTRODUODENOSCOPY (EGD);  Surgeon: Jimmye Norman, MD;  Location: [facility] ENDOSCOPY;  Service: General;  Laterality: N/A;  bedside  . PEG PLACEMENT N/A 10/10/2016   Procedure: PERCUTANEOUS ENDOSCOPIC GASTROSTOMY (PEG) PLACEMENT;  Surgeon: Jimmye Norman, MD;  Location: [facility] ENDOSCOPY;  Service: General;  Laterality: N/A;  . VENTRICULOPERITONEAL SHUNT N/A  11/02/2016   Procedure: SHUNT INSERTION VENTRICULAR-PERITONEAL;  Surgeon: Tia Alert, MD;  Location: [facility] OR;  Service: Neurosurgery;  Laterality: N/A;    History reviewed. No pertinent family history.    Social History:  reports that she has never smoked. She has never used smokeless tobacco. Her alcohol and drug histories are not on file.  Allergies  Allergen Reactions  . No Known Allergies     MEDICATIONS:  Prior to Admission:  No prescriptions prior to admission.   Scheduled: . amantadine  200 mg Per Tube BID  . bethanechol  10 mg Per Tube Q6H  . chlorhexidine  15 mL Mouth Rinse BID  . feeding supplement (PRO-STAT SUGAR FREE 64)  30 mL Per Tube Daily  . free water  100 mL Per Tube Q8H  . heparin subcutaneous  5,000 Units Subcutaneous Q8H  . mouth rinse  15 mL Mouth Rinse q12n4p  . pantoprazole sodium  40 mg Per Tube Q1200  . polyethylene glycol  17 g Oral Daily  . scopolamine  1 patch Transdermal Q72H  . senna-docusate  1 tablet Oral BID     ROS:                                                                                                                                       History obtained from unobtainable from patient due to mental status  General ROS: negative for - chills, fatigue, fever, night sweats, weight gain or weight loss Psychological ROS: negative for - behavioral disorder, hallucinations, memory difficulties, mood swings or suicidal ideation Ophthalmic ROS: negative for - blurry vision, double vision, eye pain or loss of vision ENT ROS: negative for - epistaxis, nasal discharge, oral lesions, sore throat, tinnitus or vertigo Allergy and Immunology ROS: negative for - hives or itchy/watery eyes Hematological and Lymphatic ROS: negative for - bleeding problems, bruising or swollen lymph nodes Endocrine ROS: negative for - galactorrhea, hair  pattern changes, polydipsia/polyuria or temperature intolerance Respiratory ROS: negative for - cough, hemoptysis, shortness of breath or wheezing Cardiovascular ROS: negative for - chest pain, dyspnea on exertion, edema or irregular heartbeat Gastrointestinal ROS: negative for - abdominal pain, diarrhea, hematemesis, nausea/vomiting or stool incontinence Genito-Urinary ROS: negative for - dysuria, hematuria, incontinence or urinary frequency/urgency Musculoskeletal ROS: negative for - joint swelling or muscular weakness Neurological ROS: as noted in HPI Dermatological ROS: negative for rash and skin lesion changes   Blood pressure (!) 150/88, pulse 94, temperature (!) 100.4 F (38 C), temperature source Axillary, resp. rate (!) 22, height 5\' 1"  (1.549 m), weight 153 lb 9.6 oz (69.7 kg), SpO2 93 %.   Neurologic Examination:                                                                                                      HEENT-  Normocephalic, no lesions, without obvious abnormality.  Normal external eye and conjunctiva.  Normal TM's bilaterally.  Normal auditory canals and external ears. Normal external nose, mucus membranes and septum.  Normal pharynx. Cardiovascular- S1, S2 normal, pulses palpable throughout   Lungs- harsh breath sounds noted diffusely throughout both lungs Abdomen- soft, non-tender; bowel sounds normal; no masses,  no organomegaly Extremities- no edema Lymph-no adenopathy palpable Musculoskeletal-no joint tenderness, deformity or swelling Skin-warm and dry, no hyperpigmentation, vitiligo, or suspicious lesions  Neurological Examination Mental Status: Patient withdraws to tactile stimuli on the right arm and leg she does not respond to any stimuli on the left arm and leg Cranial Nerves: II: No blink to threat bilaterally  III,IV, VI: Pupils 2 mm bilaterally and equal and reactive  V,VII: Face appears symmetric VIII: No response to verbal stimuli   Motor: Left  arm and leg are flaccid. Right arm and leg show good antigravity strength with frequent and intermittent flailing and jerking of arm that is not repetitive but intermittent with a very hemiballismus him like action--as they are proximal with large amplitude Sensory: No response on left arm or leg withdraws to tactile stimuli on the right arm and leg  Deep Tendon Reflexes: 2+ and symmetric throughout Plantars: Right: downgoing   Left: Upgoing Cerebellar: Unable to assess Gait: Not tested      Lab Results: Basic Metabolic Panel:  Recent Labs Lab 11/03/16 0612 11/04/16 1039 11/05/16 0708  NA 140 133* 132*  K 4.4 5.1 5.0  CL 111 108 104  CO2 22 17* 22  GLUCOSE 147* 116* 109*  BUN 26* 13 10  CREATININE 0.91 0.70 0.72  CALCIUM 8.2* 8.3* 8.4*    Liver Function Tests: No results for input(s): AST, ALT, ALKPHOS, BILITOT, PROT, ALBUMIN in the last 168 hours. No results for input(s): LIPASE, AMYLASE in the last 168 hours. No results for input(s): AMMONIA in the last 168 hours.  CBC:  Recent Labs Lab 11/03/16 0612 11/04/16 1039 11/05/16 0708  WBC 8.9 10.8* 11.0*  HGB 12.3 12.3 12.8  HCT 39.0 38.1 39.0  MCV 92.6 91.1 89.4  PLT 182 170 203    Cardiac Enzymes: No results for input(s): CKTOTAL, CKMB, CKMBINDEX, TROPONINI in the last 168 hours.  Lipid Panel: No results for input(s): CHOL, TRIG, HDL, CHOLHDL, VLDL, LDLCALC in the last 168 hours.  CBG:  Recent Labs Lab 11/07/16 1612 11/07/16 1959 11/08/16 0023 11/08/16 0450 11/08/16 0806  GLUCAP 121* 86 118* 110* 118*    Microbiology: Results for orders placed or performed during the hospital encounter of 10/04/16  MRSA PCR Screening     Status: None   Collection Time: 10/04/16 11:09 PM  Result Value Ref Range Status   MRSA by PCR NEGATIVE NEGATIVE Final    Comment:        The GeneXpert MRSA Assay (FDA approved for NASAL specimens only), is one component of a comprehensive MRSA colonization surveillance  program. It is not intended to diagnose MRSA infection nor to guide or monitor treatment for MRSA infections.   Culture, blood (Routine X 2) w Reflex to ID Panel     Status: None   Collection Time: 10/11/16  9:56 AM  Result Value Ref Range Status   Specimen Description BLOOD RIGHT HAND  Final   Special Requests IN PEDIATRIC BOTTLE 1CC  Final   Culture NO GROWTH 5 DAYS  Final   Report Status 10/16/2016 FINAL  Final  Culture, blood (Routine X 2) w Reflex to ID Panel     Status: None   Collection Time: 10/11/16 10:08 AM  Result Value  Ref Range Status   Specimen Description BLOOD LEFT HAND  Final   Special Requests IN PEDIATRIC BOTTLE .1CC  Final   Culture NO GROWTH 5 DAYS  Final   Report Status 10/16/2016 FINAL  Final  Culture, blood (routine x 2)     Status: None   Collection Time: 10/18/16  2:00 PM  Result Value Ref Range Status   Specimen Description BLOOD RIGHT HAND  Final   Special Requests IN PEDIATRIC BOTTLE 1CC  Final   Culture NO GROWTH 5 DAYS  Final   Report Status 10/23/2016 FINAL  Final  Culture, blood (routine x 2)     Status: None   Collection Time: 10/18/16  2:06 PM  Result Value Ref Range Status   Specimen Description BLOOD SPECIMEN SOURCE NOT MARKED ON REQUISITION  Final   Special Requests IN PEDIATRIC BOTTLE 1CC  Final   Culture NO GROWTH 5 DAYS  Final   Report Status 10/23/2016 FINAL  Final  Culture, Urine     Status: Abnormal   Collection Time: 10/18/16  3:17 PM  Result Value Ref Range Status   Specimen Description URINE, CATHETERIZED  Final   Special Requests NONE  Final   Culture >=100,000 COLONIES/mL KLEBSIELLA PNEUMONIAE (A)  Final   Report Status 10/20/2016 FINAL  Final   Organism ID, Bacteria KLEBSIELLA PNEUMONIAE (A)  Final      Susceptibility   Klebsiella pneumoniae - MIC*    AMPICILLIN >=32 RESISTANT Resistant     CEFAZOLIN <=4 SENSITIVE Sensitive     CEFTRIAXONE <=1 SENSITIVE Sensitive     CIPROFLOXACIN <=0.25 SENSITIVE Sensitive      GENTAMICIN <=1 SENSITIVE Sensitive     IMIPENEM <=0.25 SENSITIVE Sensitive     NITROFURANTOIN 64 INTERMEDIATE Intermediate     TRIMETH/SULFA <=20 SENSITIVE Sensitive     AMPICILLIN/SULBACTAM 4 SENSITIVE Sensitive     PIP/TAZO <=4 SENSITIVE Sensitive     Extended ESBL NEGATIVE Sensitive     * >=100,000 COLONIES/mL KLEBSIELLA PNEUMONIAE  Culture, respiratory (NON-Expectorated)     Status: None   Collection Time: 10/18/16  4:47 PM  Result Value Ref Range Status   Specimen Description TRACHEAL ASPIRATE  Final   Special Requests NONE  Final   Gram Stain   Final    MODERATE WBC PRESENT,BOTH PMN AND MONONUCLEAR FEW GRAM POSITIVE COCCI IN PAIRS FEW GRAM NEGATIVE RODS FEW GRAM NEGATIVE COCCOBACILLI    Culture Consistent with normal respiratory flora.  Final   Report Status 10/21/2016 FINAL  Final  CSF culture     Status: None   Collection Time: 10/22/16  8:16 AM  Result Value Ref Range Status   Specimen Description CSF  Final   Special Requests CUP C  Final   Gram Stain   Final    CYTOSPIN SMEAR WBC PRESENT, PREDOMINANTLY MONONUCLEAR NO ORGANISMS SEEN    Culture NO GROWTH 3 DAYS  Final   Report Status 10/25/2016 FINAL  Final  CSF culture with Stat gram stain     Status: None   Collection Time: 10/28/16  6:56 AM  Result Value Ref Range Status   Specimen Description CSF  Final   Special Requests IN SYRINGE  Final   Gram Stain   Final    CYTOSPIN SMEAR WBC PRESENT, PREDOMINANTLY MONONUCLEAR NO ORGANISMS SEEN    Culture NO GROWTH 3 DAYS  Final   Report Status 10/31/2016 FINAL  Final  CSF culture with Stat gram stain     Status: None   Collection  Time: 11/01/16 10:55 AM  Result Value Ref Range Status   Specimen Description CSF  Final   Special Requests 1CC SPECIMEN CUP C  Final   Gram Stain   Final    WBC PRESENT,BOTH PMN AND MONONUCLEAR NO ORGANISMS SEEN CYTOSPIN SMEAR    Culture NO GROWTH 3 DAYS  Final   Report Status 11/04/2016 FINAL  Final  Culture, blood (routine x 2)      Status: None (Preliminary result)   Collection Time: 11/04/16 10:45 AM  Result Value Ref Range Status   Specimen Description BLOOD BLOOD RIGHT HAND  Final   Special Requests BOTTLES DRAWN AEROBIC ONLY 5CC  Final   Culture NO GROWTH 3 DAYS  Final   Report Status PENDING  Incomplete  Culture, blood (routine x 2)     Status: None (Preliminary result)   Collection Time: 11/04/16 10:48 AM  Result Value Ref Range Status   Specimen Description BLOOD BLOOD RIGHT HAND  Final   Special Requests BOTTLES DRAWN AEROBIC ONLY 5CC  Final   Culture NO GROWTH 3 DAYS  Final   Report Status PENDING  Incomplete    Coagulation Studies: No results for input(s): LABPROT, INR in the last 72 hours.  Imaging: No results found.     Assessment and plan per attending neurologist  Felicie Morn PA-C [facility] [phone]  11/08/2016, 10:29 AM  I seen and evaluated the patient. As a history of brainstem hemorrhage and now presents with abnormal movements on the right.  Assessment/Plan: 41 year old female with large thalamic hemorrhage she has developed abnormal movements. The appearance is most consistent with choreaform/athetosis movements, but they're slightly more violent than typical. I do think that repeating had imaging given that there is been a change would be prudent. As with any neurological condition, things can worsen with fever and therefore this may be her underlying deficits becoming more apparent due to fever.  1) repeat CT 2) EEG 3) could use benzodiazepine PRN for the movements 4) we will follow  Ritta Slot, MD [facility] [phone]  If 7pm- 7am, please page neurology on call as listed in AMION.

## 2016-11-08 NOTE — Progress Notes (Signed)
PROGRESS NOTE    [facility]lsen  RUE:[id number] DOB: July 26, 1976 DOA: 10/04/2016 PCP: No primary care provider on file.   Brief Narrative: 41 year old female with history of hypertension who was presented to [facility] on 10/04/2016 for headache, weakness and altered mental status. CT scan of head revealed an acute intraparenchymal hemorrhage in the right thalamus with hematoma. Patient was seen by neurosurgery and admitted in PCCM. Patient with prolonged hospitalization requiring mechanical ventilation. Patient is now on trach collar and has PEG tube. VP shunt was placed by neurosurgery. [facility] was [facility] on 11/04/2016, on 31th day of inpatient hospitalization.  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 -Follow-up neurosurgery for further care. -Patient is unresponsive. No change in mental status -Patient with more agitation and kicking her Lasix today. Treated with low-dose of Ativan. I discussed with the neurologist for the consult, to evaluate for possible seizure. -Also added oxycodone for pain management.  #Acute on chronic hypoxic respiratory failure: -Currently has trach collar. No sign of bleeding. The tracheostomy tube was changed by respiratory on 2/27.  -I increased the dose of glycopyrrolate to 0.2 mg. -Currently on 6 L, 28% FiO2. -Continue supportive care. -Requiring frequent suctioning. I will check chest x-ray today.  #Acute febrile illness: Patient with intermittent elevated temperature likely central origin. Low-grade temperature last night. -Neurosurgeon do not take it is related with VP shunt. UA negative for UTI. Chest x-ray improving with no pneumonia.  -Blood culture negative to date - ID consult appreciated. No further recommendation. -Blood pressure acceptable.  #Hypoglycemia: Improved now. Blood sugar level acceptable now. Continue  tube feed.   # Goals of care discussion:  -I had a long discussion with the patient husband and patient's mother on 2/25. Also discussed with the palliative care service. Patient is  DO NOT RESUSCITATE. I discussed with the patient's mother at bedside. Ativan ordered for agitation and oxycodone for pain management. Patient is not clinically improving. Social worker evaluation ongoing for safe discharge to SNF.     Active Problems:   ICH (intracerebral hemorrhage) (HCC)   Hemorrhagic stroke (HCC)   Acute respiratory failure with hypoxia (HCC)   Hemorrhage   Nontraumatic subcortical hemorrhage of left cerebral hemisphere (HCC)   Acute on chronic respiratory failure (HCC)   Cerebrovascular accident (CVA) due to thrombosis of precerebral artery (HCC)   Tracheostomy care (HCC)   S/P ventriculoperitoneal shunt   PEG (percutaneous endoscopic gastrostomy) status (HCC)   Urinary retention   Hypoglycemia   Fever   Goals of care, counseling/discussion   Palliative care by specialist   Palliative care encounter   DVT prophylaxis: SCD. No anticoagulation because of intracranial hemorrhage Code Status: Full code Family Communication: Discussed with the patient's mother at bedside today. Disposition Plan: Likely discharge to SNF or LTAC in 1-2 days when SNF bed is available to the patient.   Consultants:   Neurosurgery  Pulmonary critical care  Palliative care  Infectious disease  General neurology  Procedures:' 1/25 - Admit with AMS, intubated  CT HEAD CODE STROKE 1/25 >Acute intraparenchymal hemorrhage in the right thalamus with hematoma measuring 6.9 mL. Intraventricular penetration. Small amount of subarachnoid penetration. CTA of the neck and head 1/25 >do not show any atherosclerotic narrowing. The vessels are somewhat tortuous suggesting hypertension. No sign of aneurysm or vascular malformation to explain the right thalamic hemorrhage. CT HEAD 1/26 >decreased volume of  right  thalamic hemorrhage, intraventricular and subarachnoid hemorrhage, progressive hydrocephalus EEG 1/31>This EEG is consistent with a focal area of cerebral dysfunction in the right hemisphere superimposed on a generalized nonspecific cerebral dysfunction as can be seen with medication effect among other causes. ECHO>55-60% 1/26 - IVC drain placed  1/31 - PEG/Tra 2/04 - 24/7 ATC 2/21 - EVD remains in place, plan for OR today at 1430 for shunt placement.  2/23 - EVD in place. No Change in Neuro status   Antimicrobials: MRSA PCR 1/25: Negative Blood Cultures x2 2/1: Negative Urine Culture 2/8: Klebsiella Blood Cultures x2 2/8: Negative Tracheal Aspirate Culture 2/8: Negative  CSF Culture 12/12: Negative CSF Culture 12/18 >>>  ANTIBIOTICS: Unasyn 1/26 - 2/1 Rocephin 2/8 - 2/14  LINES/TUBES: OETT 7.5 1/24 >1/31 Perc trach (DF) 1/31> PEG 1/31 >  Subjective: The patient was seen and examined at bedside. Patient had more agitation and kicking her Legs today. Continue to have increased secretions. No change in mental status. Mother at bedside    Objective: Vitals:   11/08/16 0314 11/08/16 0538 11/08/16 0756 11/08/16 1036  BP:  (!) 150/88  122/80  Pulse: 90 84 94 80  Resp: 20 (!) 24 (!) 22 (!) 23  Temp:  (!) 100.4 F (38 C)  99.9 F (37.7 C)  TempSrc:  Axillary  Axillary  SpO2: 98% 98% 93% 95%  Weight:      Height:       No intake or output data in the 24 hours ending 11/08/16 1142 Filed Weights   11/03/16 0500 11/05/16 0335 11/07/16 0401  Weight: 68.4 kg (150 lb 12.7 oz) 72.1 kg (159 lb) 69.7 kg (153 lb 9.6 oz)    Examination:  General exam: Ill-looking unresponsive female lying on bed.Moving extremities and agitated today. HEENT: Trach collar with no sign of discharge or bleeding. Continue to have secretions Respiratory system: Coarse breath sound bilateral, no wheezing Cardiovascular system: S1 & S2 heard, RRR.  trace pedal edema. Gastrointestinal  system: Abdomen is soft, bowel sounds positive. PEG tube site clean. Central nervous system: More agitation and moving her extremities Skin: No rashes, lesions or ulcers Psychiatry: Unable to assess as patient is unresponsive.    Data Reviewed: I have personally reviewed following labs and imaging studies  CBC:  Recent Labs Lab 11/03/16 0612 11/04/16 1039 11/05/16 0708  WBC 8.9 10.8* 11.0*  HGB 12.3 12.3 12.8  HCT 39.0 38.1 39.0  MCV 92.6 91.1 89.4  PLT 182 170 203   Basic Metabolic Panel:  Recent Labs Lab 11/03/16 0612 11/04/16 1039 11/05/16 0708  NA 140 133* 132*  K 4.4 5.1 5.0  CL 111 108 104  CO2 22 17* 22  GLUCOSE 147* 116* 109*  BUN 26* 13 10  CREATININE 0.91 0.70 0.72  CALCIUM 8.2* 8.3* 8.4*   GFR: Estimated Creatinine Clearance: 85.2 mL/min (by C-G formula based on SCr of 0.72 mg/dL). Liver Function Tests: No results for input(s): AST, ALT, ALKPHOS, BILITOT, PROT, ALBUMIN in the last 168 hours. No results for input(s): LIPASE, AMYLASE in the last 168 hours. No results for input(s): AMMONIA in the last 168 hours. Coagulation Profile: No results for input(s): INR, PROTIME in the last 168 hours. Cardiac Enzymes: No results for input(s): CKTOTAL, CKMB, CKMBINDEX, TROPONINI in the last 168 hours. BNP (last 3 results) No results for input(s): PROBNP in the last 8760 hours. HbA1C: No results for input(s): HGBA1C in the last 72 hours. CBG:  Recent Labs Lab 11/07/16 1959 11/08/16 0023 11/08/16 0450 11/08/16  [id number] 11/08/16 1119  GLUCAP 86 118* 110* 118* 120*   Lipid Profile: No results for input(s): CHOL, HDL, LDLCALC, TRIG, CHOLHDL, LDLDIRECT in the last 72 hours. Thyroid Function Tests: No results for input(s): TSH, T4TOTAL, FREET4, T3FREE, THYROIDAB in the last 72 hours. Anemia Panel: No results for input(s): VITAMINB12, FOLATE, FERRITIN, TIBC, IRON, RETICCTPCT in the last 72 hours. Sepsis Labs: No results for input(s): PROCALCITON, LATICACIDVEN in  the last 168 hours.  Recent Results (from the past 240 hour(s))  CSF culture with Stat gram stain     Status: None   Collection Time: 11/01/16 10:55 AM  Result Value Ref Range Status   Specimen Description CSF  Final   Special Requests 1CC SPECIMEN CUP C  Final   Gram Stain   Final    WBC PRESENT,BOTH PMN AND MONONUCLEAR NO ORGANISMS SEEN CYTOSPIN SMEAR    Culture NO GROWTH 3 DAYS  Final   Report Status 11/04/2016 FINAL  Final  Culture, blood (routine x 2)     Status: None (Preliminary result)   Collection Time: 11/04/16 10:45 AM  Result Value Ref Range Status   Specimen Description BLOOD BLOOD RIGHT HAND  Final   Special Requests BOTTLES DRAWN AEROBIC ONLY 5CC  Final   Culture NO GROWTH 3 DAYS  Final   Report Status PENDING  Incomplete  Culture, blood (routine x 2)     Status: None (Preliminary result)   Collection Time: 11/04/16 10:48 AM  Result Value Ref Range Status   Specimen Description BLOOD BLOOD RIGHT HAND  Final   Special Requests BOTTLES DRAWN AEROBIC ONLY 5CC  Final   Culture NO GROWTH 3 DAYS  Final   Report Status PENDING  Incomplete         Radiology Studies: No results found.      Scheduled Meds: . amantadine  200 mg Per Tube BID  . bethanechol  10 mg Per Tube Q6H  . chlorhexidine  15 mL Mouth Rinse BID  . feeding supplement (PRO-STAT SUGAR FREE 64)  30 mL Per Tube Daily  . free water  100 mL Per Tube Q8H  . heparin subcutaneous  5,000 Units Subcutaneous Q8H  . mouth rinse  15 mL Mouth Rinse q12n4p  . pantoprazole sodium  40 mg Per Tube Q1200  . polyethylene glycol  17 g Oral Daily  . scopolamine  1 patch Transdermal Q72H  . senna-docusate  1 tablet Oral BID   Continuous Infusions: . feeding supplement (JEVITY 1.2 CAL) 1,000 mL (11/08/16 0038)     LOS: 35 days    Berdell Hostetler Jaynie CollinsPrasad Lunabella Badgett, MD [facility] Pager [phone]  If 7PM-7AM, please contact night-coverage [email or web address] Password [email or web address] 11/08/2016, 11:42 AM

## 2016-11-09 ENCOUNTER — Inpatient Hospital Stay (HOSPITAL_COMMUNITY)
Admit: 2016-11-09 | Discharge: 2016-11-09 | Disposition: A | Discharge status: Home / Self Care | Payer: 59 | Attending: Neurology | Admitting: Neurology

## 2016-11-09 DIAGNOSIS — R4182 Altered mental status, unspecified: Secondary | ICD-10-CM

## 2016-11-09 DIAGNOSIS — R52 Pain, unspecified: Secondary | ICD-10-CM

## 2016-11-09 LAB — CULTURE, BLOOD (ROUTINE X 2)
Culture: NO GROWTH
Culture: NO GROWTH

## 2016-11-09 LAB — GLUCOSE, CAPILLARY
GLUCOSE-CAPILLARY: 107 mg/dL — AB (ref 65–99)
Glucose-Capillary: 120 mg/dL — ABNORMAL HIGH (ref 65–99)
Glucose-Capillary: 127 mg/dL — ABNORMAL HIGH (ref 65–99)
Glucose-Capillary: 130 mg/dL — ABNORMAL HIGH (ref 65–99)

## 2016-11-09 MED ORDER — HALOPERIDOL 1 MG PO TABS
1.0000 mg | ORAL_TABLET | Freq: Two times a day (BID) | ORAL | Status: DC
Start: 2016-11-09 — End: 2016-11-11
  Administered 2016-11-09 – 2016-11-10 (×4): 1 mg
  Filled 2016-11-09 (×5): qty 1

## 2016-11-09 MED ORDER — FUROSEMIDE 10 MG/ML IJ SOLN
20.0000 mg | Freq: Once | INTRAMUSCULAR | Status: AC
  Administered 2016-11-09: 20 mg via INTRAVENOUS
  Filled 2016-11-09: qty 4

## 2016-11-09 MED ORDER — OXYCODONE HCL 5 MG PO TABS
5.0000 mg | ORAL_TABLET | Freq: Three times a day (TID) | ORAL | Status: DC
  Administered 2016-11-09 – 2016-11-10 (×3): 5 mg via ORAL
  Filled 2016-11-09 (×4): qty 1

## 2016-11-09 MED ORDER — OXYCODONE HCL ER 10 MG PO T12A: 10.0000 mg | EXTENDED_RELEASE_TABLET | Freq: Two times a day (BID) | ORAL | Status: DC

## 2016-11-09 MED ORDER — GLYCOPYRROLATE 0.2 MG/ML IJ SOLN: 0.3000 mg | INTRAMUSCULAR | Status: DC | PRN

## 2016-11-09 NOTE — Progress Notes (Signed)
Trach care done per RRT. IC cleansed with NS and wire brush. IC placed back and locked into place no distress noted. Pt tolerated it well

## 2016-11-09 NOTE — Progress Notes (Signed)
Subjective: Patient remains very drowsy but continues to have choreiform movements. They seem to be less volatile today  Exam: Vitals:   11/09/16 0905 11/09/16 0911  BP:  (!) 144/92  Pulse: 96 98  Resp: 18 20  Temp:  100.1 F (37.8 C)    HEENT-  Normocephalic, no lesions, without obvious abnormality.  Normal external eye and conjunctiva.  Normal TM's bilaterally.  Normal auditory canals and external ears. Normal external nose, mucus membranes and septum.  Normal pharynx.     Gen: In bed, NAD MS: Patient again withdraws to tactile stimuli on the right arm and leg but does not respond in the left arm and leg.  Cranial Nerves: II: No blink to threat bilaterally  III,IV, VI: Pupils 2 mm bilaterally and equal and reactive  V,VII: Face appears symmetric VIII: No response to verbal stimuli   Motor: Left arm and leg are flaccid. Right arm and leg show good antigravity strength with Less frequent today but still intermittent flailing and jerking of arm that is not repetitive but intermittent with a very hemiballismus him like action--as they are proximal with large amplitude Sensory: No response on left arm or leg withdraws to tactile stimuli on the right arm and leg  Deep Tendon Reflexes: 2+ and symmetric throughout      Pertinent Labs/Diagnostics: EEG has been obtain awaiting reading  CT head which was repeated and showed in tremor removal of the right frontal ventriculostomy. New right parietal ventriculoperitoneal shunt with distal tip in the left frontal horn of the lateral ventricle. Resolved hydrocephalus.  Felicie MornDavid Smith PA-C [facility] [phone]  Impression: 41 year old female with large thalamic hemorrhage she has developed abnormal movements. The appearance is most consistent with choreaform/athetosis movements. EEG does not support an epileptic nature to them. Could consider treatment with Haldol. I would also rule out other possible causes of pain which  could be causing agitation.   Recommendations: 1) could consider haloperidol 2) agree with treating pain if present.  Ritta SlotMcNeill Asencion Guisinger, MD [facility] [phone]  If 7pm- 7am, please page neurology on call as listed in AMION.   11/09/2016, 9:30 AM

## 2016-11-09 NOTE — Progress Notes (Signed)
PROGRESS NOTE    Anne JerichoViviana Olsen  UJW:[id number] DOB: [birth year] DOA: 10/04/2016 PCP: No primary care provider on file.   Brief Narrative: 41 year old female with history of hypertension who was [facility] for headache, weakness and altered mental status. CT scan of head revealed an acute intraparenchymal hemorrhage in the right thalamus with hematoma. Patient was seen by neurosurgery and admitted in PCCM. Patient with prolonged hospitalization requiring mechanical ventilation. Patient is now on trach collar and has PEG tube. VP shunt was placed by neurosurgery. [facility], on 31th day of inpatient hospitalization.  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 -Follow-up neurosurgery for further care. -Patient is unresponsive. No change in mental status  #Agitation: The patient is more agitated and moving her upper and lower extremities. Neurology consult was obtained. CT scan of head with no acute change in findings. EEG with encephalopathy but no seizure-like activity. I discussed with Dr. Amada JupiterKirkpatrick from neurologist today. Start Haldol twice a day for agitation and start some immediate release oxycodone every 8 hourly from the 2 for pain management. Unable to use long-acting OxyContin through tube because it cannot be crossed.  -Also  Ativan as needed. -Follow-up neurologist evaluation and recommended admission. No clinical improvement.  #Acute on chronic hypoxic respiratory failure: -Currently has trach collar. No sign of bleeding. The tracheostomy tube was changed by respiratory on 2/27.  -Patient has increased tracheal secretions. Chest x-ray was obtained which was consistent with possible mild pulmonary edema. I will order a dose of Lasix today. Continue to see clinical response. -I increased the dose of  glycopyrrolate to 0.3 mg. -Currently on 6 L, 28% FiO2. -Continue supportive care. -Requiring frequent suctioning. Respiratory therapy assess following.  #Acute febrile illness: Patient with intermittent elevated temperature likely central origin. Low-grade temperature last night. -Neurosurgeon do not take it is related with VP shunt. UA negative for UTI. Chest x-ray improving with no pneumonia.  -Blood culture negative so far. - ID consult appreciated. No further recommendation. -Blood pressure acceptable.  #Hypoglycemia: Improved now. Blood sugar level acceptable now. Continue tube feed.   # Goals of care discussion:  -I had a long discussion with the patient husband and patient's mother on 2/25. Also discussed with the palliative care service. Patient is  DO NOT RESUSCITATE. No significant clinical improvement. Palliative care has been following.   Active Problems:   ICH (intracerebral hemorrhage) (HCC)   Hemorrhagic stroke (HCC)   Acute respiratory failure with hypoxia (HCC)   Hemorrhage   Nontraumatic subcortical hemorrhage of left cerebral hemisphere (HCC)   Acute on chronic respiratory failure (HCC)   Cerebrovascular accident (CVA) due to thrombosis of precerebral artery (HCC)   Tracheostomy care (HCC)   S/P ventriculoperitoneal shunt   PEG (percutaneous endoscopic gastrostomy) status (HCC)   Urinary retention   Hypoglycemia   Fever   Goals of care, counseling/discussion   Palliative care by specialist   Palliative care encounter   Increased tracheal secretions   Altered mental state   DVT prophylaxis: SCD. No anticoagulation because of intracranial hemorrhage Code Status: Full code Family Communication: Discussed with the patient's mother at bedside today. Disposition Plan: Likely discharge to SNF or LTAC in 1-2 days when SNF bed is available to the patient. Social worker evaluation ongoing   Consultants:   Neurosurgery  Pulmonary critical care  Palliative  care  Infectious  disease  General neurology  Procedures:' 1/25 - Admit with AMS, intubated  CT HEAD CODE STROKE 1/25 >Acute intraparenchymal hemorrhage in the right thalamus with hematoma measuring 6.9 mL. Intraventricular penetration. Small amount of subarachnoid penetration. CTA of the neck and head 1/25 >do not show any atherosclerotic narrowing. The vessels are somewhat tortuous suggesting hypertension. No sign of aneurysm or vascular malformation to explain the right thalamic hemorrhage. CT HEAD 1/26 >decreased volume of right thalamic hemorrhage, intraventricular and subarachnoid hemorrhage, progressive hydrocephalus EEG 1/31>This EEG is consistent with a focal area of cerebral dysfunction in the right hemisphere superimposed on a generalized nonspecific cerebral dysfunction as can be seen with medication effect among other causes. ECHO>55-60% 1/26 - IVC drain placed  1/31 - PEG/Tra 2/04 - 24/7 ATC 2/21 - EVD remains in place, plan for OR today at 1430 for shunt placement.  2/23 - EVD in place. No Change in Neuro status   Antimicrobials: MRSA PCR 1/25: Negative Blood Cultures x2 2/1: Negative Urine Culture 2/8: Klebsiella Blood Cultures x2 2/8: Negative Tracheal Aspirate Culture 2/8: Negative  CSF Culture 12/12: Negative CSF Culture 12/18 >>>  ANTIBIOTICS: Unasyn 1/26 - 2/1 Rocephin 2/8 - 2/14  LINES/TUBES: OETT 7.5 1/24 >1/31 Perc trach (DF) 1/31> PEG 1/31 >  Subjective: The patient was seen and examined at bedside. Patient is more agitated and has increased tracheal secretions. Patient's mother at bedside. No clinical improvement.    Objective: Vitals:   11/09/16 0905 11/09/16 0911 11/09/16 1137 11/09/16 1313  BP:  (!) 144/92  119/78  Pulse: 96 98 96 (!) 117  Resp: 18 20 (!) 22 20  Temp:  100.1 F (37.8 C)  98.2 F (36.8 C)  TempSrc:  Axillary  Axillary  SpO2: 96% 94% 93% 95%  Weight:      Height:       No intake or output data in the 24  hours ending 11/09/16 1448 Filed Weights   11/03/16 0500 11/05/16 0335 11/07/16 0401  Weight: 68.4 kg (150 lb 12.7 oz) 72.1 kg (159 lb) 69.7 kg (153 lb 9.6 oz)    Examination:  General exam: Ill-looking unresponsive female lying on bed.Moving extremities and Agitating. HEENT: Trach collar increased secretions, no sign of bleeding. Respiratory system: Coarse breath sound bilateral, no wheezing Cardiovascular system: S1 & S2 heard, RRR.  trace pedal edema. Gastrointestinal system: Abdomen is soft, bowel sounds positive. PEG tube site clean. Central nervous system: More agitation and moving her extremities Skin: No rashes, lesions or ulcers Psychiatry: Unable to assess as patient is unresponsive.    Data Reviewed: I have personally reviewed following labs and imaging studies  CBC:  Recent Labs Lab 11/03/16 0612 11/04/16 1039 11/05/16 0708  WBC 8.9 10.8* 11.0*  HGB 12.3 12.3 12.8  HCT 39.0 38.1 39.0  MCV 92.6 91.1 89.4  PLT 182 170 203   Basic Metabolic Panel:  Recent Labs Lab 11/03/16 0612 11/04/16 1039 11/05/16 0708  NA 140 133* 132*  K 4.4 5.1 5.0  CL 111 108 104  CO2 22 17* 22  GLUCOSE 147* 116* 109*  BUN 26* 13 10  CREATININE 0.91 0.70 0.72  CALCIUM 8.2* 8.3* 8.4*   GFR: Estimated Creatinine Clearance: 85.2 mL/min (by C-G formula based on SCr of 0.72 mg/dL). Liver Function Tests: No results for input(s): AST, ALT, ALKPHOS, BILITOT, PROT, ALBUMIN in the last 168 hours. No results for input(s): LIPASE, AMYLASE in the last 168 hours. No results for input(s): AMMONIA in the last 168 hours. Coagulation Profile:  No results for input(s): INR, PROTIME in the last 168 hours. Cardiac Enzymes: No results for input(s): CKTOTAL, CKMB, CKMBINDEX, TROPONINI in the last 168 hours. BNP (last 3 results) No results for input(s): PROBNP in the last 8760 hours. HbA1C: No results for input(s): HGBA1C in the last 72 hours. CBG:  Recent Labs Lab 11/08/16 0806  11/08/16 1119 11/08/16 2005 11/08/16 2357 11/09/16 1313  GLUCAP 118* 120* 117* 129* 130*   Lipid Profile: No results for input(s): CHOL, HDL, LDLCALC, TRIG, CHOLHDL, LDLDIRECT in the last 72 hours. Thyroid Function Tests: No results for input(s): TSH, T4TOTAL, FREET4, T3FREE, THYROIDAB in the last 72 hours. Anemia Panel: No results for input(s): VITAMINB12, FOLATE, FERRITIN, TIBC, IRON, RETICCTPCT in the last 72 hours. Sepsis Labs: No results for input(s): PROCALCITON, LATICACIDVEN in the last 168 hours.  Recent Results (from the past 240 hour(s))  CSF culture with Stat gram stain     Status: None   Collection Time: 11/01/16 10:55 AM  Result Value Ref Range Status   Specimen Description CSF  Final   Special Requests 1CC SPECIMEN CUP C  Final   Gram Stain   Final    WBC PRESENT,BOTH PMN AND MONONUCLEAR NO ORGANISMS SEEN CYTOSPIN SMEAR    Culture NO GROWTH 3 DAYS  Final   Report Status 11/04/2016 FINAL  Final  Culture, blood (routine x 2)     Status: None (Preliminary result)   Collection Time: 11/04/16 10:45 AM  Result Value Ref Range Status   Specimen Description BLOOD BLOOD RIGHT HAND  Final   Special Requests BOTTLES DRAWN AEROBIC ONLY 5CC  Final   Culture NO GROWTH 4 DAYS  Final   Report Status PENDING  Incomplete  Culture, blood (routine x 2)     Status: None (Preliminary result)   Collection Time: 11/04/16 10:48 AM  Result Value Ref Range Status   Specimen Description BLOOD BLOOD RIGHT HAND  Final   Special Requests BOTTLES DRAWN AEROBIC ONLY 5CC  Final   Culture NO GROWTH 4 DAYS  Final   Report Status PENDING  Incomplete         Radiology Studies: Ct Head Wo Contrast  Result Date: 11/08/2016 CLINICAL DATA:  Followup intracranial hemorrhage October 04, 2016. Now with abnormal RIGHT-sided movements today. EXAM: CT HEAD WITHOUT CONTRAST TECHNIQUE: Contiguous axial images were obtained from the base of the skull through the vertex without intravenous contrast.  COMPARISON:  CT HEAD November 02, 2016 and multiple priors. FINDINGS: BRAIN: Interval placement of ventriculoperitoneal shunt via RIGHT parietal burr hole with distal tip traversing midline within LEFT frontal horn of lateral ventricle. Slit-like RIGHT lateral ventricle. Interval removal of RIGHT frontal ventriculostomy catheter. RIGHT thalamus and RIGHT midbrain infarct. No intraparenchymal hemorrhage, mass effect or acute large vascular territory infarcts. Persistent mild effacement of the basal cisterns. VASCULAR: Unremarkable. SKULL/SOFT TISSUES: No skull fracture. No significant soft tissue swelling. ORBITS/SINUSES: The included ocular globes and orbital contents are normal.The mastoid aircells and included paranasal sinuses are well-aerated. OTHER: None. IMPRESSION: Interim removal of RIGHT frontal ventriculostomy. New RIGHT parietal ventriculoperitoneal shunt with distal tip in the LEFT frontal horn of the lateral ventricle. Resolved hydrocephalus, slit-like RIGHT lateral ventricle. Old RIGHT thalamus/ midbrain infarct. Electronically Signed   By: Awilda Metro M.D.   On: 11/08/2016 23:13   Dg Chest Port 1 View  Result Date: 11/08/2016 CLINICAL DATA:  Tracheal secretions. EXAM: PORTABLE CHEST 1 VIEW COMPARISON:  11/04/2016 . FINDINGS: Tracheostomy tube in stable position. Ventricular peritoneal shunt tubing in  stable position. Borderline cardiomegaly. Mild pulmonary vascular prominence and interstitial prominence. Mild CHF cannot be excluded. Mild pneumonitis cannot be excluded. Low lung volumes. No pneumothorax. IMPRESSION: 1. Tracheostomy tube and stent and VP shunt tubing in stable position. 2. Borderline cardiomegaly with mild bilateral interstitial prominence. Mild CHF cannot be excluded. Mild pneumonitis cannot be excluded. 3. Low lung volumes. Electronically Signed   By: Maisie Fus  Register   On: 11/08/2016 13:14        Scheduled Meds: . amantadine  200 mg Per Tube BID  . bethanechol  10  mg Per Tube Q6H  . chlorhexidine  15 mL Mouth Rinse BID  . feeding supplement (PRO-STAT SUGAR FREE 64)  30 mL Per Tube Daily  . free water  100 mL Per Tube Q8H  . haloperidol  1 mg Oral BID  . heparin subcutaneous  5,000 Units Subcutaneous Q8H  . mouth rinse  15 mL Mouth Rinse q12n4p  . oxyCODONE  10 mg Oral Q12H  . pantoprazole sodium  40 mg Per Tube Q1200  . polyethylene glycol  17 g Oral Daily  . scopolamine  1 patch Transdermal Q72H  . senna-docusate  1 tablet Oral BID   Continuous Infusions: . feeding supplement (JEVITY 1.2 CAL) 55 mL/hr at 11/09/16 0403     LOS: 36 days    Jshon Ibe Jaynie Collins, MD [facility] Pager [phone]  If 7PM-7AM, please contact night-coverage [email or web address] Password [facility] 11/09/2016, 2:48 PM

## 2016-11-09 NOTE — Procedures (Signed)
History: 41 year old female with previous brain hemorrhage now with abnormal movements  Sedation: None  Technique: This is a 21 channel routine scalp EEG performed at the bedside with bipolar and monopolar montages arranged in accordance to the international 10/20 system of electrode placement. One channel was dedicated to EKG recording.    Background: There is a poorly formed posterior dominant rhythm of 7-8 Hz. In addition, there is mild low voltage generalized irregular delta activity. Sleep is recorded with relatively symmetric appearing structures.  Photic stimulation: Physiologic driving is not performed  EEG Abnormalities: 1) Slow PDR 2) Generalized delta activity  Clinical Interpretation: This EEG is consistent with a mild generalized non-specific cerebral dysfunction(encephalopathy). There was no seizure or seizure predisposition recorded on this study. Please note that a normal EEG does not preclude the possibility of epilepsy.   The abnormal movements of concern(balistic right sided movements) were captured and there is no evidence on this study to support an epileptic nature.   Ritta SlotMcNeill Jakyia Gaccione, MD [facility] [phone]  If 7pm- 7am, please page neurology on call as listed in AMION.

## 2016-11-09 NOTE — Progress Notes (Signed)
STAT EEG completed; results pending. Dr Lindzen notified. 

## 2016-11-10 LAB — GLUCOSE, CAPILLARY
GLUCOSE-CAPILLARY: 133 mg/dL — AB (ref 65–99)
GLUCOSE-CAPILLARY: 119 mg/dL — AB (ref 65–99)
GLUCOSE-CAPILLARY: 149 mg/dL — AB (ref 65–99)
GLUCOSE-CAPILLARY: 134 mg/dL — AB (ref 65–99)
GLUCOSE-CAPILLARY: 124 mg/dL — AB (ref 65–99)

## 2016-11-10 MED ORDER — OXYCODONE HCL 5 MG PO TABS
5.0000 mg | ORAL_TABLET | Freq: Two times a day (BID) | ORAL | Status: DC
  Administered 2016-11-10: 5 mg via ORAL
  Filled 2016-11-10: qty 1

## 2016-11-10 NOTE — Progress Notes (Signed)
Subjective: Some improvement in amplitude of movements.   Exam: Vitals:   11/10/16 0809 11/10/16 1126  BP:    Pulse: 98 96  Resp: 20 20  Temp:      HEENT-  Normocephalic, no lesions, without obvious abnormality.  Normal external eye and conjunctiva.  Normal TM's bilaterally.  Normal auditory canals and external ears. Normal external nose, mucus membranes and septum.  Normal pharynx.     Gen: In bed, NAD MS: opens eyes to noxious stimulation.   Cranial Nerves: II: No blink to threat bilaterally  III,IV, VI: Pupils 2 mm bilaterally and equal and reactive  V,VII: Face appears symmetric VIII: No response to verbal stimuli   Motor: Spastic lef themiparesis. Right arm and leg with decrease in amplitude of momvemetns, many look quasi-purposeful.  Sensory: No response on left arm or leg withdraws to tactile stimuli on the right arm and leg   Impression: 41 year old female with large thalamic hemorrhage. She has developed abnormal movements. The appearance is most consistent with choreaform/athetosis movements and I think there is some degree of volitional movement. . Could consider treatment with Haldol, but will need to balance side effect of sedation with benefit to movements.  I would also rule out other possible causes of pain which could be causing agitation.   Recommendations: 1) Could increase haldol slightly if movements continue to be problematic.  2) agree with treating pain if present. 3) No further recommendations at this time. Please call with further questions or concerns.   Ritta SlotMcNeill Tennis Mckinnon, MD [facility] [phone]  If 7pm- 7am, please page neurology on call as listed in AMION.   11/10/2016, 1:16 PM

## 2016-11-10 NOTE — Progress Notes (Signed)
Oral airway device removed this morning for trial out of her mouth; patient has been clinching her teeth together threw out the day and biting on the side of her tongue;frequent mouth care and suctioning of blood; MD made aware; will replace oral airway to protect the tongue.

## 2016-11-10 NOTE — Progress Notes (Signed)
Pt suctioned got back copious amounts of tan thick secretions. Pt tolerated it well. No distress noted.

## 2016-11-10 NOTE — Progress Notes (Signed)
PROGRESS NOTE    Anne Olsen  JYN:[id number] DOB: 03-04-1976 DOA: 10/04/2016 PCP: No primary care provider on file.   Brief Narrative: 41 year old female with history of hypertension who was presented to [facility] on 10/04/2016 for headache, weakness and altered mental status. CT scan of head revealed an acute intraparenchymal hemorrhage in the right thalamus with hematoma. Patient was seen by neurosurgery and admitted in PCCM. Patient with prolonged hospitalization requiring mechanical ventilation. Patient is now on trach collar and has PEG tube. VP shunt was placed by neurosurgery. Patient was transferred from [facility] to [facility] on 11/04/2016, on 31th day of inpatient hospitalization.  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 -Follow-up neurosurgery for further care. -Patient is unresponsive. No change in mental status  #Agitation: The patient was more agitated and moving her upper and lower extremities. Neurology consult was obtained. CT scan of head with no acute change in findings. EEG with encephalopathy but no seizure-like activity. I discussed with Dr. Amada Jupiter from neurologist on 3/2. Started Haldol twice a day for agitation and started immediate release oxycodone  for pain management. Unable to use long-acting OxyContin through tube because it cannot be crossed.  -Patient is more comfortable today. Not agitated. I will reduce the dose of oxycodone to twice a day with instructions to hold if patient is sedated. -Also  Ativan as needed. -Follow-up neurologist evaluation and recommended admission. No clinical improvement.  #Acute on chronic hypoxic respiratory failure: -Currently has trach collar. No sign of bleeding. The tracheostomy tube was changed by respiratory on 2/27.  -Patient has increased tracheal secretions. Chest x-ray was obtained which was consistent with possible  mild pulmonary edema. Treated with a dose of Lasix on 3/2. Free water discontinued. Currently on glycopyrrolate  0.3 mg. today, the secretion this test. Continue to provide supportive care. Respiratory therapist following.  #Acute febrile illness: Patient with intermittent elevated temperature likely central origin. Low-grade temperature last night. -Neurosurgeon do not take it is related with VP shunt. UA negative for UTI. Chest x-ray improving with no pneumonia.  -Blood culture negative so far. - ID consult appreciated. No further recommendation. -Blood pressure acceptable.  #Hypoglycemia: Improved now. Blood sugar level acceptable now. Continue tube feed.   # Goals of care discussion:  -I had a long discussion with the patient husband and patient's mother on 2/25. Also discussed with the palliative care service. Patient is  DO NOT RESUSCITATE. No significant clinical improvement. Palliative care has been following. I discussed with the palliative care team today, patient likely benefit from hospice care evaluation. We will brought up with the family when available.   Active Problems:   ICH (intracerebral hemorrhage) (HCC)   Hemorrhagic stroke (HCC)   Acute respiratory failure with hypoxia (HCC)   Hemorrhage   Nontraumatic subcortical hemorrhage of left cerebral hemisphere (HCC)   Acute on chronic respiratory failure (HCC)   Cerebrovascular accident (CVA) due to thrombosis of precerebral artery (HCC)   Tracheostomy care (HCC)   S/P ventriculoperitoneal shunt   PEG (percutaneous endoscopic gastrostomy) status (HCC)   Urinary retention   Hypoglycemia   Fever   Goals of care, counseling/discussion   Palliative care by specialist   Palliative care encounter   Increased tracheal secretions   Altered mental state   Pain management   DVT prophylaxis: SCD. No anticoagulation because of intracranial hemorrhage Code Status: Full code Family Communication: No family at  bedside Disposition  Plan: Likely discharge to SNF or LTAC in 1-2 days when SNF bed is available to the patient. Social worker evaluation ongoing   Consultants:   Neurosurgery  Pulmonary critical care  Palliative care  Infectious disease  General neurology  Procedures:' 1/25 - Admit with AMS, intubated  CT HEAD CODE STROKE 1/25 >Acute intraparenchymal hemorrhage in the right thalamus with hematoma measuring 6.9 mL. Intraventricular penetration. Small amount of subarachnoid penetration. CTA of the neck and head 1/25 >do not show any atherosclerotic narrowing. The vessels are somewhat tortuous suggesting hypertension. No sign of aneurysm or vascular malformation to explain the right thalamic hemorrhage. CT HEAD 1/26 >decreased volume of right thalamic hemorrhage, intraventricular and subarachnoid hemorrhage, progressive hydrocephalus EEG 1/31>This EEG is consistent with a focal area of cerebral dysfunction in the right hemisphere superimposed on a generalized nonspecific cerebral dysfunction as can be seen with medication effect among other causes. ECHO>55-60% 1/26 - IVC drain placed  1/31 - PEG/Tra 2/04 - 24/7 ATC 2/21 - EVD remains in place, plan for OR today at 1430 for shunt placement.  2/23 - EVD in place. No Change in Neuro status   Antimicrobials: MRSA PCR 1/25: Negative Blood Cultures x2 2/1: Negative Urine Culture 2/8: Klebsiella Blood Cultures x2 2/8: Negative Tracheal Aspirate Culture 2/8: Negative  CSF Culture 12/12: Negative CSF Culture 12/18 >>>  ANTIBIOTICS: Unasyn 1/26 - 2/1 Rocephin 2/8 - 2/14  LINES/TUBES: OETT 7.5 1/24 >1/31 Perc trach (DF) 1/31> PEG 1/31 >  Subjective: The patient was seen and examined at bedside. Patient is more calm and comfortable. The tracheal secretions is less. No other new event. Examined with the patient's nurse is at bedside  Objective: Vitals:   11/10/16 0314 11/10/16 0513 11/10/16 0809 11/10/16 1126  BP:   120/88    Pulse: 95 97 98 96  Resp: (!) 26 20 20 20   Temp:  99.1 F (37.3 C)    TempSrc:  Axillary    SpO2: 98% 98% 93% 95%  Weight:      Height:        Intake/Output Summary (Last 24 hours) at 11/10/16 1209 Last data filed at 11/10/16 0600  Gross per 24 hour  Intake              860 ml  Output                0 ml  Net              860 ml   Filed Weights   11/03/16 0500 11/05/16 0335 11/07/16 0401  Weight: 68.4 kg (150 lb 12.7 oz) 72.1 kg (159 lb) 69.7 kg (153 lb 9.6 oz)    Examination:  General exam: Ill-looking unresponsive female lying on bed.looks comfortable and less agitating. HEENT: Trach collar has decreased secretions, no sign of bleeding. Respiratory system: Coarse breath sound bilateral, no wheezing Cardiovascular system: S1 & S2 heard, RRR.  trace pedal edema. Gastrointestinal system: Abdomen is soft, bowel sounds positive. PEG tube site clean. Central nervous system: More agitation and moving her extremities Skin: No rashes, lesions or ulcers Psychiatry: Unable to assess as patient is unresponsive.    Data Reviewed: I have personally reviewed following labs and imaging studies  CBC:  Recent Labs Lab 11/04/16 1039 11/05/16 0708  WBC 10.8* 11.0*  HGB 12.3 12.8  HCT 38.1 39.0  MCV 91.1 89.4  PLT 170 203   Basic Metabolic Panel:  Recent Labs Lab 11/04/16 1039 11/05/16 0708  NA 133* 132*  K 5.1 5.0  CL 108 104  CO2 17* 22  GLUCOSE 116* 109*  BUN 13 10  CREATININE 0.70 0.72  CALCIUM 8.3* 8.4*   GFR: Estimated Creatinine Clearance: 85.2 mL/min (by C-G formula based on SCr of 0.72 mg/dL). Liver Function Tests: No results for input(s): AST, ALT, ALKPHOS, BILITOT, PROT, ALBUMIN in the last 168 hours. No results for input(s): LIPASE, AMYLASE in the last 168 hours. No results for input(s): AMMONIA in the last 168 hours. Coagulation Profile: No results for input(s): INR, PROTIME in the last 168 hours. Cardiac Enzymes: No results for input(s):  CKTOTAL, CKMB, CKMBINDEX, TROPONINI in the last 168 hours. BNP (last 3 results) No results for input(s): PROBNP in the last 8760 hours. HbA1C: No results for input(s): HGBA1C in the last 72 hours. CBG:  Recent Labs Lab 11/09/16 1950 11/09/16 2342 11/10/16 0325 11/10/16 0755 11/10/16 1132  GLUCAP 107* 120* 133* 119* 149*   Lipid Profile: No results for input(s): CHOL, HDL, LDLCALC, TRIG, CHOLHDL, LDLDIRECT in the last 72 hours. Thyroid Function Tests: No results for input(s): TSH, T4TOTAL, FREET4, T3FREE, THYROIDAB in the last 72 hours. Anemia Panel: No results for input(s): VITAMINB12, FOLATE, FERRITIN, TIBC, IRON, RETICCTPCT in the last 72 hours. Sepsis Labs: No results for input(s): PROCALCITON, LATICACIDVEN in the last 168 hours.  Recent Results (from the past 240 hour(s))  CSF culture with Stat gram stain     Status: None   Collection Time: 11/01/16 10:55 AM  Result Value Ref Range Status   Specimen Description CSF  Final   Special Requests 1CC SPECIMEN CUP C  Final   Gram Stain   Final    WBC PRESENT,BOTH PMN AND MONONUCLEAR NO ORGANISMS SEEN CYTOSPIN SMEAR    Culture NO GROWTH 3 DAYS  Final   Report Status 11/04/2016 FINAL  Final  Culture, blood (routine x 2)     Status: None   Collection Time: 11/04/16 10:45 AM  Result Value Ref Range Status   Specimen Description BLOOD BLOOD RIGHT HAND  Final   Special Requests BOTTLES DRAWN AEROBIC ONLY 5CC  Final   Culture NO GROWTH 5 DAYS  Final   Report Status 11/09/2016 FINAL  Final  Culture, blood (routine x 2)     Status: None   Collection Time: 11/04/16 10:48 AM  Result Value Ref Range Status   Specimen Description BLOOD BLOOD RIGHT HAND  Final   Special Requests BOTTLES DRAWN AEROBIC ONLY 5CC  Final   Culture NO GROWTH 5 DAYS  Final   Report Status 11/09/2016 FINAL  Final         Radiology Studies: Ct Head Wo Contrast  Result Date: 11/08/2016 CLINICAL DATA:  Followup intracranial hemorrhage October 04, 2016. Now with abnormal RIGHT-sided movements today. EXAM: CT HEAD WITHOUT CONTRAST TECHNIQUE: Contiguous axial images were obtained from the base of the skull through the vertex without intravenous contrast. COMPARISON:  CT HEAD November 02, 2016 and multiple priors. FINDINGS: BRAIN: Interval placement of ventriculoperitoneal shunt via RIGHT parietal burr hole with distal tip traversing midline within LEFT frontal horn of lateral ventricle. Slit-like RIGHT lateral ventricle. Interval removal of RIGHT frontal ventriculostomy catheter. RIGHT thalamus and RIGHT midbrain infarct. No intraparenchymal hemorrhage, mass effect or acute large vascular territory infarcts. Persistent mild effacement of the basal cisterns. VASCULAR: Unremarkable. SKULL/SOFT TISSUES: No skull fracture. No significant soft tissue swelling. ORBITS/SINUSES: The included ocular globes and orbital contents are normal.The mastoid aircells and included paranasal sinuses are well-aerated. OTHER: None. IMPRESSION: Interim  removal of RIGHT frontal ventriculostomy. New RIGHT parietal ventriculoperitoneal shunt with distal tip in the LEFT frontal horn of the lateral ventricle. Resolved hydrocephalus, slit-like RIGHT lateral ventricle. Old RIGHT thalamus/ midbrain infarct. Electronically Signed   By: Awilda Metro M.D.   On: 11/08/2016 23:13   Dg Chest Port 1 View  Result Date: 11/08/2016 CLINICAL DATA:  Tracheal secretions. EXAM: PORTABLE CHEST 1 VIEW COMPARISON:  11/04/2016 . FINDINGS: Tracheostomy tube in stable position. Ventricular peritoneal shunt tubing in stable position. Borderline cardiomegaly. Mild pulmonary vascular prominence and interstitial prominence. Mild CHF cannot be excluded. Mild pneumonitis cannot be excluded. Low lung volumes. No pneumothorax. IMPRESSION: 1. Tracheostomy tube and stent and VP shunt tubing in stable position. 2. Borderline cardiomegaly with mild bilateral interstitial prominence. Mild CHF cannot be excluded.  Mild pneumonitis cannot be excluded. 3. Low lung volumes. Electronically Signed   By: Maisie Fus  Register   On: 11/08/2016 13:14        Scheduled Meds: . amantadine  200 mg Per Tube BID  . bethanechol  10 mg Per Tube Q6H  . chlorhexidine  15 mL Mouth Rinse BID  . feeding supplement (PRO-STAT SUGAR FREE 64)  30 mL Per Tube Daily  . haloperidol  1 mg Per Tube BID  . heparin subcutaneous  5,000 Units Subcutaneous Q8H  . mouth rinse  15 mL Mouth Rinse q12n4p  . oxyCODONE  5 mg Oral q12n4p  . pantoprazole sodium  40 mg Per Tube Q1200  . polyethylene glycol  17 g Oral Daily  . scopolamine  1 patch Transdermal Q72H  . senna-docusate  1 tablet Oral BID   Continuous Infusions: . feeding supplement (JEVITY 1.2 CAL) 55 mL/hr (11/09/16 2155)     LOS: 37 days    Dron Jaynie Collins, MD [facility] Pager [phone]  If 7PM-7AM, please contact night-coverage [email or web address] Password [email or web address] 11/10/2016, 12:09 PM

## 2016-11-10 NOTE — Progress Notes (Signed)
Rain out in trach circuit empty

## 2016-11-11 LAB — GLUCOSE, CAPILLARY
GLUCOSE-CAPILLARY: 104 mg/dL — AB (ref 65–99)
GLUCOSE-CAPILLARY: 124 mg/dL — AB (ref 65–99)
GLUCOSE-CAPILLARY: 148 mg/dL — AB (ref 65–99)
Glucose-Capillary: 110 mg/dL — ABNORMAL HIGH (ref 65–99)
Glucose-Capillary: 123 mg/dL — ABNORMAL HIGH (ref 65–99)
Glucose-Capillary: 129 mg/dL — ABNORMAL HIGH (ref 65–99)

## 2016-11-11 MED ORDER — OXYCODONE HCL 5 MG PO TABS
5.0000 mg | ORAL_TABLET | Freq: Three times a day (TID) | ORAL | Status: DC
  Administered 2016-11-11 – 2016-11-15 (×12): 5 mg via ORAL
  Filled 2016-11-11 (×13): qty 1

## 2016-11-11 MED ORDER — HALOPERIDOL 1 MG PO TABS
1.5000 mg | ORAL_TABLET | Freq: Two times a day (BID) | ORAL | Status: DC
  Administered 2016-11-11 – 2016-11-15 (×8): 1.5 mg
  Filled 2016-11-11 (×9): qty 2

## 2016-11-11 MED ORDER — GLYCOPYRROLATE 1 MG PO TABS
1.0000 mg | ORAL_TABLET | Freq: Three times a day (TID) | ORAL | Status: DC
  Administered 2016-11-11 – 2016-11-15 (×13): 1 mg via ORAL
  Filled 2016-11-11 (×14): qty 1

## 2016-11-11 NOTE — Progress Notes (Signed)
PROGRESS NOTE    Anne Olsen  ZOX:[id number] DOB: 07/19/1976 DOA: 10/04/2016 PCP: No primary care provider on file.   Brief Narrative: 41 year old female with history of hypertension who was presented to [facility] on 10/04/2016 for headache, weakness and altered mental status. CT scan of head revealed an acute intraparenchymal hemorrhage in the right thalamus with hematoma. Patient was seen by neurosurgery and admitted in PCCM. Patient with prolonged hospitalization requiring mechanical ventilation. Patient is now on trach collar and has PEG tube. VP shunt was placed by neurosurgery. Patient was transferred from [facility] to [facility] on 11/04/2016, on 31th day of inpatient hospitalization.  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 -Follow-up neurosurgery for further care. -Patient is unresponsive. No change in mental status  #Agitation: Patient is more calm today but is still moving her lower extremities. -Neurology consult was obtained. CT scan of head with no acute change in findings. EEG with encephalopathy but no seizure-like activity. I discussed with Dr. Amada Jupiter from neurologist on 3/2. Started Haldol twice a day for agitation and started immediate release oxycodone  for pain management. Unable to use long-acting OxyContin through tube because it cannot be crossed.  -Because of persistence of agitation, I increased the dose of Haldol to 1.5 twice a day and make oxycodone every 8 hourly. Continue to provide supportive care. Palliative care is also following. -Also  Ativan as needed. -Follow-up neurologist evaluation and recommended admission. No clinical improvement.  #Acute on chronic hypoxic respiratory failure: -Currently has trach collar. No sign of bleeding. The tracheostomy tube was changed by respiratory on 2/27.  -Patient has increased tracheal secretions. Chest x-ray was  obtained which was consistent with possible mild pulmonary edema. Treated with a dose of Lasix on 3/2. Free water discontinued. Changed glycopyrrolate to 1 mg pills today also on a scopolamine.  Continue to provide supportive care. Respiratory therapist following.  #Acute febrile illness: Patient with intermittent elevated temperature likely central origin. Low-grade temperature last night. -Neurosurgeon do not take it is related with VP shunt. UA negative for UTI. Chest x-ray improving with no pneumonia.  -Blood culture negative so far. - ID consult appreciated. No further recommendation. -Blood pressure acceptable.  #Hypoglycemia: Improved now. Blood sugar level acceptable now. Continue tube feed.   # Goals of care discussion:  -I had a long discussion with the patient husband and patient's mother on 2/25. Also discussed with the palliative care service. Patient is  DO NOT RESUSCITATE. No significant clinical improvement. Palliative care has been following. I discussed with the palliative care team on 3/3, patient likely benefit from hospice care evaluation. We will brought up with the family when available.   Active Problems:   ICH (intracerebral hemorrhage) (HCC)   Hemorrhagic stroke (HCC)   Acute respiratory failure with hypoxia (HCC)   Hemorrhage   Nontraumatic subcortical hemorrhage of left cerebral hemisphere (HCC)   Acute on chronic respiratory failure (HCC)   Cerebrovascular accident (CVA) due to thrombosis of precerebral artery (HCC)   Tracheostomy care (HCC)   S/P ventriculoperitoneal shunt   PEG (percutaneous endoscopic gastrostomy) status (HCC)   Urinary retention   Hypoglycemia   Fever   Goals of care, counseling/discussion   Palliative care by specialist   Palliative care encounter   Increased tracheal secretions   Altered mental state   Pain management   DVT prophylaxis: SCD. No anticoagulation because of intracranial hemorrhage Code Status: Full code  Family  Communication: No family at bedside Disposition Plan: Likely discharge to SNF or LTAC in 1-2 days when SNF bed is available to the patient. Social worker evaluation ongoing   Consultants:   Neurosurgery  Pulmonary critical care  Palliative care  Infectious disease  General neurology  Procedures:' 1/25 - Admit with AMS, intubated  CT HEAD CODE STROKE 1/25 >Acute intraparenchymal hemorrhage in the right thalamus with hematoma measuring 6.9 mL. Intraventricular penetration. Small amount of subarachnoid penetration. CTA of the neck and head 1/25 >do not show any atherosclerotic narrowing. The vessels are somewhat tortuous suggesting hypertension. No sign of aneurysm or vascular malformation to explain the right thalamic hemorrhage. CT HEAD 1/26 >decreased volume of right thalamic hemorrhage, intraventricular and subarachnoid hemorrhage, progressive hydrocephalus EEG 1/31>This EEG is consistent with a focal area of cerebral dysfunction in the right hemisphere superimposed on a generalized nonspecific cerebral dysfunction as can be seen with medication effect among other causes. ECHO>55-60% 1/26 - IVC drain placed  1/31 - PEG/Tra 2/04 - 24/7 ATC 2/21 - EVD remains in place, plan for OR today at 1430 for shunt placement.  2/23 - EVD in place. No Change in Neuro status   Antimicrobials: MRSA PCR 1/25: Negative Blood Cultures x2 2/1: Negative Urine Culture 2/8: Klebsiella Blood Cultures x2 2/8: Negative Tracheal Aspirate Culture 2/8: Negative  CSF Culture 12/12: Negative CSF Culture 12/18 >>>  ANTIBIOTICS: Unasyn 1/26 - 2/1 Rocephin 2/8 - 2/14  LINES/TUBES: OETT 7.5 1/24 >1/31 Perc trach (DF) 1/31> PEG 1/31 >  Subjective: The patient was seen and examined at bedside. Has minimal secretions. Moving lower extremities. Mental status is unchanged. No clinical improvement.  Objective: Vitals:   11/11/16 0500 11/11/16 0621 11/11/16 0849 11/11/16 0912  BP:  121/83   (!) 123/95  Pulse:  97 95 91  Resp:  18 20 18   Temp:  98.5 F (36.9 C)  99.1 F (37.3 C)  TempSrc:  Axillary  Oral  SpO2:  96% 95% 95%  Weight: 69.9 kg (154 lb)     Height:       No intake or output data in the 24 hours ending 11/11/16 1120 Filed Weights   11/05/16 0335 11/07/16 0401 11/11/16 0500  Weight: 72.1 kg (159 lb) 69.7 kg (153 lb 9.6 oz) 69.9 kg (154 lb)    Examination:  General exam: Ill-looking unresponsive female lying on bed. Mild agitation and moving her extremities. Much better than today's ago. HEENT: Trach collar has minimal secretions, no sign of bleeding. Respiratory system: Coarse breath sound bilateral, no wheezing Cardiovascular system: S1 & S2 heard, RRR.  trace pedal edema. Gastrointestinal system: Abdomen is soft, bowel sounds positive. PEG tube site clean. Central nervous system: More agitation and moving her extremities Skin: No rashes, lesions or ulcers Psychiatry: Unable to assess as patient is unresponsive.    Data Reviewed: I have personally reviewed following labs and imaging studies  CBC:  Recent Labs Lab 11/05/16 0708  WBC 11.0*  HGB 12.8  HCT 39.0  MCV 89.4  PLT 203   Basic Metabolic Panel:  Recent Labs Lab 11/05/16 0708  NA 132*  K 5.0  CL 104  CO2 22  GLUCOSE 109*  BUN 10  CREATININE 0.72  CALCIUM 8.4*   GFR: Estimated Creatinine Clearance: 85.2 mL/min (by C-G formula based on SCr of 0.72 mg/dL). Liver Function Tests: No results for input(s): AST, ALT, ALKPHOS, BILITOT, PROT, ALBUMIN in the last 168 hours. No results for input(s): LIPASE, AMYLASE in the last  168 hours. No results for input(s): AMMONIA in the last 168 hours. Coagulation Profile: No results for input(s): INR, PROTIME in the last 168 hours. Cardiac Enzymes: No results for input(s): CKTOTAL, CKMB, CKMBINDEX, TROPONINI in the last 168 hours. BNP (last 3 results) No results for input(s): PROBNP in the last 8760 hours. HbA1C: No results for input(s):  HGBA1C in the last 72 hours. CBG:  Recent Labs Lab 11/10/16 1132 11/10/16 1606 11/10/16 1939 11/11/16 0004 11/11/16 0415  GLUCAP 149* 134* 124* 110* 104*   Lipid Profile: No results for input(s): CHOL, HDL, LDLCALC, TRIG, CHOLHDL, LDLDIRECT in the last 72 hours. Thyroid Function Tests: No results for input(s): TSH, T4TOTAL, FREET4, T3FREE, THYROIDAB in the last 72 hours. Anemia Panel: No results for input(s): VITAMINB12, FOLATE, FERRITIN, TIBC, IRON, RETICCTPCT in the last 72 hours. Sepsis Labs: No results for input(s): PROCALCITON, LATICACIDVEN in the last 168 hours.  Recent Results (from the past 240 hour(s))  Culture, blood (routine x 2)     Status: None   Collection Time: 11/04/16 10:45 AM  Result Value Ref Range Status   Specimen Description BLOOD BLOOD RIGHT HAND  Final   Special Requests BOTTLES DRAWN AEROBIC ONLY 5CC  Final   Culture NO GROWTH 5 DAYS  Final   Report Status 11/09/2016 FINAL  Final  Culture, blood (routine x 2)     Status: None   Collection Time: 11/04/16 10:48 AM  Result Value Ref Range Status   Specimen Description BLOOD BLOOD RIGHT HAND  Final   Special Requests BOTTLES DRAWN AEROBIC ONLY 5CC  Final   Culture NO GROWTH 5 DAYS  Final   Report Status 11/09/2016 FINAL  Final         Radiology Studies: No results found.      Scheduled Meds: . amantadine  200 mg Per Tube BID  . bethanechol  10 mg Per Tube Q6H  . chlorhexidine  15 mL Mouth Rinse BID  . feeding supplement (PRO-STAT SUGAR FREE 64)  30 mL Per Tube Daily  . glycopyrrolate  1 mg Oral TID  . haloperidol  1.5 mg Per Tube BID  . heparin subcutaneous  5,000 Units Subcutaneous Q8H  . oxyCODONE  5 mg Oral TID  . pantoprazole sodium  40 mg Per Tube Q1200  . polyethylene glycol  17 g Oral Daily  . scopolamine  1 patch Transdermal Q72H  . senna-docusate  1 tablet Oral BID   Continuous Infusions: . feeding supplement (JEVITY 1.2 CAL) 1,000 mL (11/10/16 1000)     LOS: 38 days     Dron Jaynie CollinsPrasad Bhandari, MD [facility] Pager [phone]  If 7PM-7AM, please contact night-coverage [email or web address] Password [email or web address] 11/11/2016, 11:20 AM

## 2016-11-12 DIAGNOSIS — D72829 Elevated white blood cell count, unspecified: Secondary | ICD-10-CM

## 2016-11-12 DIAGNOSIS — E875 Hyperkalemia: Secondary | ICD-10-CM

## 2016-11-12 LAB — BASIC METABOLIC PANEL
Anion gap: 7 (ref 5–15)
BUN: 15 mg/dL (ref 6–20)
CHLORIDE: 100 mmol/L — AB (ref 101–111)
CO2: 26 mmol/L (ref 22–32)
CREATININE: 0.81 mg/dL (ref 0.44–1.00)
Calcium: 9.1 mg/dL (ref 8.9–10.3)
GFR calc non Af Amer: >60 mL/min (ref >60)
Glucose, Bld: 112 mg/dL — ABNORMAL HIGH (ref 65–99)
Potassium: 5.5 mmol/L — ABNORMAL HIGH (ref 3.5–5.1)
SODIUM: 133 mmol/L — AB (ref 135–145)

## 2016-11-12 LAB — CBC
HCT: 40.2 % (ref 36.0–46.0)
HEMOGLOBIN: 13.3 g/dL (ref 12.0–15.0)
MCH: 30 pg (ref 26.0–34.0)
MCHC: 33.1 g/dL (ref 30.0–36.0)
MCV: 90.5 fL (ref 78.0–100.0)
Platelets: 410 10*3/uL — ABNORMAL HIGH (ref 150–400)
RBC: 4.44 MIL/uL (ref 3.87–5.11)
RDW: 13.3 % (ref 11.5–15.5)
WBC: 16.6 10*3/uL — ABNORMAL HIGH (ref 4.0–10.5)

## 2016-11-12 LAB — GLUCOSE, CAPILLARY
GLUCOSE-CAPILLARY: 132 mg/dL — AB (ref 65–99)
GLUCOSE-CAPILLARY: 138 mg/dL — AB (ref 65–99)
Glucose-Capillary: 116 mg/dL — ABNORMAL HIGH (ref 65–99)
Glucose-Capillary: 116 mg/dL — ABNORMAL HIGH (ref 65–99)
Glucose-Capillary: 121 mg/dL — ABNORMAL HIGH (ref 65–99)

## 2016-11-12 MED ORDER — FREE WATER
200.0000 mL | Freq: Three times a day (TID) | Status: AC
  Administered 2016-11-12 – 2016-11-13 (×3): 200 mL

## 2016-11-12 MED ORDER — PRO-STAT SUGAR FREE PO LIQD
30.0000 mL | Freq: Two times a day (BID) | ORAL | Status: DC
  Administered 2016-11-12 – 2016-11-15 (×6): 30 mL
  Filled 2016-11-12 (×6): qty 30

## 2016-11-12 MED ORDER — NEPRO/CARBSTEADY PO LIQD
1000.0000 mL | ORAL | Status: DC
  Administered 2016-11-12 – 2016-11-15 (×5): 1000 mL via ORAL
  Filled 2016-11-12 (×5): qty 1000

## 2016-11-12 MED ORDER — SODIUM POLYSTYRENE SULFONATE 15 GM/60ML PO SUSP
15.0000 g | Freq: Two times a day (BID) | ORAL | Status: AC
  Administered 2016-11-12 (×2): 15 g
  Filled 2016-11-12 (×3): qty 60

## 2016-11-12 NOTE — Progress Notes (Signed)
Nutrition Follow-up   INTERVENTION:  Provide Nepro with Carb Steady @ 35 ml/hr (840 ml/day) 30 ml Prostat BID Provides: 1712 kcal, 98 grams protein, and 610 ml free water.  Total potassium provided in TF regimen is 930 mg Potassium   NUTRITION DIAGNOSIS:   Inadequate oral intake related to inability to eat as evidenced by NPO status.  ongoing  GOAL:   Patient will meet greater than or equal to 90% of their needs  Being met  MONITOR:   TF tolerance, Vent status, I & O's  REASON FOR ASSESSMENT:   Consult  (Change to Lowest K+ TF)  ASSESSMENT:   Pt with PMH of HTN admitted with R intraparenchymal brain hemorrhage.   Pt currently receiving Jevity 1.2 @ 55 ml/hr (1320 ml/day) with 30 ml Prostat daily which provides 1684 kcal, 88 grams protein, and 1069 ml free water. RD paged by MD to due concern for elevated potassium and request to change TF to lower potassium formula.   Labs: elevated potassium (5.5), low sodium (133)  Diet Order:  Diet NPO time specified  Skin:  Reviewed, no issues  Last BM:  2/28  Height:   Ht Readings from Last 1 Encounters:  10/04/16 '5\' 1"'$  (1.549 m)    Weight:   Wt Readings from Last 1 Encounters:  11/12/16 151 lb 7.3 oz (68.7 kg)    Ideal Body Weight:  47.7 kg  BMI:  Body mass index is 28.62 kg/m.  Estimated Nutritional Needs:   Kcal:  1600-1800  Protein:  85-100 grams  Fluid:  > 1.6 L/day  EDUCATION NEEDS:   No education needs identified at this time  Scarlette Ar RD, LDN, CSP Inpatient Clinical Dietitian Pager: [phone] After Hours Pager: [phone]

## 2016-11-12 NOTE — Progress Notes (Signed)
Daily Progress Note   Patient Name: Anne Olsen       Date: 11/12/2016 DOB: 08/13/1976  Age: 41 y.o. MRN#: [id number] Attending Physician: Maxie Barbron Prasad Bhandari, MD Primary Care Physician: No primary care provider on file. Admit Date: 10/04/2016  Reason for Consultation/Follow-up: Establishing goals of care and Psychosocial/spiritual support  Subjective: Anne Olsen remains unresponsive. Much unchanged. RN says secretions and restlessness have been much improved.   Length of Stay: 39  Current Medications: Scheduled Meds:  . amantadine  200 mg Per Tube BID  . bethanechol  10 mg Per Tube Q6H  . chlorhexidine  15 mL Mouth Rinse BID  . feeding supplement (NEPRO CARB STEADY)  1,000 mL Oral Q24H  . feeding supplement (PRO-STAT SUGAR FREE 64)  30 mL Per Tube BID  . free water  200 mL Per Tube Q8H  . glycopyrrolate  1 mg Oral TID  . haloperidol  1.5 mg Per Tube BID  . heparin subcutaneous  5,000 Units Subcutaneous Q8H  . oxyCODONE  5 mg Oral TID  . pantoprazole sodium  40 mg Per Tube Q1200  . polyethylene glycol  17 g Oral Daily  . scopolamine  1 patch Transdermal Q72H  . senna-docusate  1 tablet Oral BID  . sodium polystyrene  15 g Per Tube BID    Continuous Infusions:   PRN Meds: sodium chloride, acetaminophen **OR** acetaminophen, acetaminophen, bisacodyl, ipratropium-albuterol, labetalol, LORazepam  Physical Exam  Constitutional: She appears well-developed. She has a sickly appearance. She is not intubated.  Cardiovascular: Normal rate.   Pulmonary/Chest: Effort normal. No accessory muscle usage. No tachypnea. She is not intubated. No respiratory distress.  Abdominal: Soft.  + PEG  Neurological: She is unresponsive.  Restless at times but nonpurposeful movement  Nursing note  and vitals reviewed.           Vital Signs: BP (!) 153/97 (BP Location: Left Arm)   Pulse 100   Temp 97.4 F (36.3 C) (Axillary)   Resp 20   Ht 5\' 1"  (1.549 m)   Wt 68.7 kg (151 lb 7.3 oz)   LMP  (LMP Unknown) Comment: neg preg test 10/04/16  SpO2 94%   BMI 28.62 kg/m  SpO2: SpO2: 94 % O2 Device: O2 Device: Tracheostomy Collar O2 Flow Rate: O2 Flow Rate (L/min): 10 L/min  Intake/output summary:  No intake or output data in the 24 hours ending 11/12/16 1932 LBM: Last BM Date: 11/07/16 Baseline Weight: Weight: 70 kg (154 lb 5.2 oz) (ESTIMATED) Most recent weight: Weight: 68.7 kg (151 lb 7.3 oz)       Palliative Assessment/Data:    Flowsheet Rows   Flowsheet Row Most Recent Value  Intake Tab  Referral Department  Hospitalist  Unit at Time of Referral  Med/Surg Unit  Palliative Care Primary Diagnosis  Neurology  Date Notified  11/04/16  Palliative Care Type  New Palliative care  Reason for referral  Clarify Goals of Care  Date of Admission  10/04/16  Date first seen by Palliative Care  11/04/16  # of days Palliative referral response time  0 Day(s)  # of days IP prior to Palliative referral  31  Clinical Assessment  Psychosocial & Spiritual Assessment  Palliative Care Outcomes      Patient Active Problem List   Diagnosis Date Noted  . Hyperkalemia   . Leukocytosis   . Altered mental state   . Pain management   . Palliative care encounter   . Increased tracheal secretions   . Fever   . Goals of care, counseling/discussion   . Palliative care by specialist   . PEG (percutaneous endoscopic gastrostomy) status (HCC) 11/03/2016  . Urinary retention 11/03/2016  . Hypoglycemia 11/03/2016  . S/P ventriculoperitoneal shunt 11/02/2016  . Tracheostomy care (HCC)   . Cerebrovascular accident (CVA) due to thrombosis of precerebral artery (HCC)   . Acute on chronic respiratory failure (HCC)   . Nontraumatic subcortical hemorrhage of left cerebral hemisphere (HCC)   .  Hemorrhage   . Acute respiratory failure with hypoxia (HCC)   . Hemorrhagic stroke (HCC)   . ICH (intracerebral hemorrhage) (HCC) 10/04/2016    Palliative Care Assessment & Plan   HPI: 41 y.o. female  with past medical history of HTN who presented to the ED with headache, weakness, and altered mental status. She was subsequently admitted on 10/04/2016 with an acute intraparenchymal hemorrhage in the thalamus with associated hematoma and intraventricular and subarachnoid penetration. She required intubation, and is now s/p trach and PEG. She also had progressive hydrocephalus with subsequent placement of ventriculostomy drain, then right ventriculoperitoneal shunt. Neurologically she is unresponsive. Palliative consulted to assist in determining goals of care.   Assessment: No family at bedside at the time of my visit. I did speak with her husband via telephone. He has been busy trying to work on disability as well as Medicaid for [employer]. He continues to be more concerned with getting her settled in SNF. When I ask him how he thought Anne Olsen is doing he tells me that he felt she opened her eye briefly, smiled, and said she was possibly trying to talk. He was receptive of my call but clearly not in a place that he would be accepting of hospice/comfort discussion.   Unfortunately we have not witnessed any improvement in neurological status. I chose not to bring up hospice/comfort care today as I feel husband was clearly not yet ready for this by the contents of our conversation. This is a very unfortunate and difficult situation  Recommendations/Plan:  Pain: Agree with oxycodone 5 mg TID. Recommend monitoring closely for need for increase.  Agitation: Agree with haldol 1.5 mg BID. Ativan 1 mg every 4 hours prn.   Goals of Care and Additional Recommendations:  Limitations on Scope of Treatment: Full Scope Treatment outside DNR  Code Status:  DNR  Prognosis:  Unable to  determine  Discharge Planning:  SNF with palliative follow up  Thank you for allowing the Palliative Medicine Team to assist in the care of this patient.   Total Time Prolonged Time Billed  no       Greater than 50%  of this time was spent counseling and coordinating care related to the above assessment and plan.  Yong Channel, NP Palliative Medicine Team Pager # [phone] (M-F 8a-5p) Team Phone # [phone] (Nights/Weekends)

## 2016-11-12 NOTE — Progress Notes (Signed)
Changed o2 flow to 28% per trach, no obvious distress noted. resp even unlabored. Sat remains ~93%. Suctioning performed with catheter, no complications.

## 2016-11-12 NOTE — Progress Notes (Addendum)
PROGRESS NOTE    Anne Olsen  ZOX:[id number] DOB: 1976-06-22 DOA: 10/04/2016 PCP: No primary care provider on file.   Brief Narrative: 41 year old female with history of hypertension who [facility] for headache, weakness and altered mental status. CT scan of head revealed an acute intraparenchymal hemorrhage in the right thalamus with hematoma. Patient was seen by neurosurgery and admitted in PCCM. Patient with prolonged hospitalization requiring mechanical ventilation. Patient is now on trach collar and has PEG tube. VP shunt was placed by neurosurgery. [facility], on 31th day of inpatient hospitalization.  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 -Follow-up neurosurgery for further care. -Patient is unresponsive. No change in mental status  #Agitation: Patient is more calm and comfortable today. -Neurology consult was obtained. CT scan of head with no acute change in findings. EEG with encephalopathy but no seizure-like activity. I discussed with Dr. Amada JupiterKirkpatrick from neurologist on 3/2. Started Haldol twice a day for agitation and started immediate release oxycodone  for pain management. Unable to use long-acting OxyContin through tube because it cannot be crossed.  -Patient looks more comfortable today therefore I'll continue current dose of dose of Haldol1.5 twice a day and  oxycodone every 8 hourly. Continue to provide supportive care. Palliative care is also following. Patient will likely benefit from hospice this point since there is no significant clinical improvement. -Also  Ativan as needed. -Follow-up neurologist evaluation and recommended admission. No clinical improvement.  #Acute on chronic hypoxic respiratory failure: -Currently has trach collar. No sign of bleeding. The tracheostomy tube was  changed by respiratory on 2/27.  -Patient's tracheal secretions seems decreasing. continue current dose of glycopyrrolate. Continue breathing treatment. Respiratory therapist following.  #Acute febrile illness: Patient with intermittent elevated temperature likely central origin. Now afebrile -Neurosurgeon do not take it is related with VP shunt. UA negative for UTI. Chest x-ray improving with no pneumonia.  -Blood culture negative so far. - ID consult appreciated. No further recommendation. -Leukocytosis and thrombocytosis likely in the setting of  Stress. Free water was held because of increased secretions. For now continue to monitor.  #Hyperkalemia: Patient with borderline potassium level. Renal function is stable. Patient is currently on JVD. I discussed with the dietitian today to change tube feed to Nepro which is has low potassium. Continue to monitor labs. Ordered 2 doses of Kayexalate.  #Hypoglycemia: Improved now. Blood sugar level acceptable now. Continue tube feed,   # Goals of care discussion:  -I had a long discussion with the patient husband and patient's mother on 2/25. Also discussed with the palliative care service. Patient is  DO NOT RESUSCITATE. No significant clinical improvement. Palliative care has been following. I discussed with the palliative care team on 3/3, patient likely benefit from hospice care evaluation. We will brought up with the family when available.   Active Problems:   ICH (intracerebral hemorrhage) (HCC)   Hemorrhagic stroke (HCC)   Acute respiratory failure with hypoxia (HCC)   Hemorrhage   Nontraumatic subcortical hemorrhage of left cerebral hemisphere (HCC)   Acute on chronic respiratory failure (HCC)   Cerebrovascular accident (CVA) due to thrombosis of precerebral artery (HCC)   Tracheostomy care (HCC)   S/P ventriculoperitoneal shunt   PEG (percutaneous endoscopic gastrostomy) status (HCC)   Urinary retention   Hypoglycemia   Fever   Goals  of care, counseling/discussion  Palliative care by specialist   Palliative care encounter   Increased tracheal secretions   Altered mental state   Pain management   DVT prophylaxis: SCD. No anticoagulation because of intracranial hemorrhage Code Status: Full code Family Communication: No family at bedside Disposition Plan: Likely discharge to SNF or LTAC in 1-2 days when SNF bed is available to the patient. Social worker evaluation ongoing   Consultants:   Neurosurgery  Pulmonary critical care  Palliative care  Infectious disease  General neurology  Procedures:' 1/25 - Admit with AMS, intubated  CT HEAD CODE STROKE 1/25 >Acute intraparenchymal hemorrhage in the right thalamus with hematoma measuring 6.9 mL. Intraventricular penetration. Small amount of subarachnoid penetration. CTA of the neck and head 1/25 >do not show any atherosclerotic narrowing. The vessels are somewhat tortuous suggesting hypertension. No sign of aneurysm or vascular malformation to explain the right thalamic hemorrhage. CT HEAD 1/26 >decreased volume of right thalamic hemorrhage, intraventricular and subarachnoid hemorrhage, progressive hydrocephalus EEG 1/31>This EEG is consistent with a focal area of cerebral dysfunction in the right hemisphere superimposed on a generalized nonspecific cerebral dysfunction as can be seen with medication effect among other causes. ECHO>55-60% 1/26 - IVC drain placed  1/31 - PEG/Tra 2/04 - 24/7 ATC 2/21 - EVD remains in place, plan for OR today at 1430 for shunt placement.  2/23 - EVD in place. No Change in Neuro status   Antimicrobials: MRSA PCR 1/25: Negative Blood Cultures x2 2/1: Negative Urine Culture 2/8: Klebsiella Blood Cultures x2 2/8: Negative Tracheal Aspirate Culture 2/8: Negative  CSF Culture 12/12: Negative CSF Culture 12/18 >>>  ANTIBIOTICS: Unasyn 1/26 - 2/1 Rocephin 2/8 - 2/14  LINES/TUBES: OETT 7.5 1/24 >1/31 Perc trach  (DF) 1/31> PEG 1/31 >  Subjective: The patient was seen and examined at bedside. Has less tracheal secretions. Looks calm and comfortable. Mental status is unchanged. No clinical improvement.  Objective: Vitals:   11/12/16 0410 11/12/16 0500 11/12/16 0721 11/12/16 0959  BP:  135/82  (!) 145/91  Pulse: 89 91 94 97  Resp: 20 (!) 24 (!) 24 20  Temp:  98.9 F (37.2 C)  98.6 F (37 C)  TempSrc:  Axillary  Axillary  SpO2: 96% 97%  94%  Weight:  68.7 kg (151 lb 7.3 oz)    Height:       No intake or output data in the 24 hours ending 11/12/16 1047 Filed Weights   11/07/16 0401 11/11/16 0500 11/12/16 0500  Weight: 69.7 kg (153 lb 9.6 oz) 69.9 kg (154 lb) 68.7 kg (151 lb 7.3 oz)    Examination:  General exam: Ill-looking female who is calm and comfortable, no agitation.  HEENT: Trach collar has minimal secretions, no sign of bleeding. Respiratory system: Clear bilateral, no wheezing Cardiovascular system: S1 & S2 heard, RRR.  trace pedal edema. Gastrointestinal system: Abdomen is soft, bowel sounds positive. PEG tube site clean. Central nervous system: Unresponsive Skin: No rashes, lesions or ulcers Psychiatry: Unable to assess as patient is unresponsive.    Data Reviewed: I have personally reviewed following labs and imaging studies  CBC:  Recent Labs Lab 11/12/16 0805  WBC 16.6*  HGB 13.3  HCT 40.2  MCV 90.5  PLT 410*   Basic Metabolic Panel:  Recent Labs Lab 11/12/16 0805  NA 133*  K 5.5*  CL 100*  CO2 26  GLUCOSE 112*  BUN 15  CREATININE 0.81  CALCIUM 9.1   GFR: Estimated Creatinine Clearance: 83.5 mL/min (by C-G formula based on  SCr of 0.81 mg/dL). Liver Function Tests: No results for input(s): AST, ALT, ALKPHOS, BILITOT, PROT, ALBUMIN in the last 168 hours. No results for input(s): LIPASE, AMYLASE in the last 168 hours. No results for input(s): AMMONIA in the last 168 hours. Coagulation Profile: No results for input(s): INR, PROTIME in the last 168  hours. Cardiac Enzymes: No results for input(s): CKTOTAL, CKMB, CKMBINDEX, TROPONINI in the last 168 hours. BNP (last 3 results) No results for input(s): PROBNP in the last 8760 hours. HbA1C: No results for input(s): HGBA1C in the last 72 hours. CBG:  Recent Labs Lab 11/11/16 1120 11/11/16 1617 11/11/16 2035 11/12/16 0011 11/12/16 0853  GLUCAP 148* 129* 123* 116* 132*   Lipid Profile: No results for input(s): CHOL, HDL, LDLCALC, TRIG, CHOLHDL, LDLDIRECT in the last 72 hours. Thyroid Function Tests: No results for input(s): TSH, T4TOTAL, FREET4, T3FREE, THYROIDAB in the last 72 hours. Anemia Panel: No results for input(s): VITAMINB12, FOLATE, FERRITIN, TIBC, IRON, RETICCTPCT in the last 72 hours. Sepsis Labs: No results for input(s): PROCALCITON, LATICACIDVEN in the last 168 hours.  Recent Results (from the past 240 hour(s))  Culture, blood (routine x 2)     Status: None   Collection Time: 11/04/16 10:45 AM  Result Value Ref Range Status   Specimen Description BLOOD BLOOD RIGHT HAND  Final   Special Requests BOTTLES DRAWN AEROBIC ONLY 5CC  Final   Culture NO GROWTH 5 DAYS  Final   Report Status 11/09/2016 FINAL  Final  Culture, blood (routine x 2)     Status: None   Collection Time: 11/04/16 10:48 AM  Result Value Ref Range Status   Specimen Description BLOOD BLOOD RIGHT HAND  Final   Special Requests BOTTLES DRAWN AEROBIC ONLY 5CC  Final   Culture NO GROWTH 5 DAYS  Final   Report Status 11/09/2016 FINAL  Final         Radiology Studies: No results found.      Scheduled Meds: . amantadine  200 mg Per Tube BID  . bethanechol  10 mg Per Tube Q6H  . chlorhexidine  15 mL Mouth Rinse BID  . feeding supplement (PRO-STAT SUGAR FREE 64)  30 mL Per Tube Daily  . glycopyrrolate  1 mg Oral TID  . haloperidol  1.5 mg Per Tube BID  . heparin subcutaneous  5,000 Units Subcutaneous Q8H  . oxyCODONE  5 mg Oral TID  . pantoprazole sodium  40 mg Per Tube Q1200  .  polyethylene glycol  17 g Oral Daily  . scopolamine  1 patch Transdermal Q72H  . senna-docusate  1 tablet Oral BID   Continuous Infusions: . feeding supplement (JEVITY 1.2 CAL) 1,000 mL (11/11/16 1947)     LOS: 39 days    Suzzette Gasparro Jaynie Collins, MD [facility] Pager [phone]  If 7PM-7AM, please contact night-coverage [email or web address] Password [facility] 11/12/2016, 10:47 AM

## 2016-11-13 ENCOUNTER — Inpatient Hospital Stay (HOSPITAL_COMMUNITY): Payer: 59

## 2016-11-13 LAB — GLUCOSE, CAPILLARY
GLUCOSE-CAPILLARY: 131 mg/dL — AB (ref 65–99)
GLUCOSE-CAPILLARY: 107 mg/dL — AB (ref 65–99)
Glucose-Capillary: 119 mg/dL — ABNORMAL HIGH (ref 65–99)
Glucose-Capillary: 121 mg/dL — ABNORMAL HIGH (ref 65–99)
Glucose-Capillary: 122 mg/dL — ABNORMAL HIGH (ref 65–99)
Glucose-Capillary: 131 mg/dL — ABNORMAL HIGH (ref 65–99)

## 2016-11-13 LAB — BASIC METABOLIC PANEL
ANION GAP: 10 (ref 5–15)
BUN: 17 mg/dL (ref 6–20)
CHLORIDE: 99 mmol/L — AB (ref 101–111)
CO2: 26 mmol/L (ref 22–32)
Calcium: 9.2 mg/dL (ref 8.9–10.3)
Creatinine, Ser: 0.76 mg/dL (ref 0.44–1.00)
GFR calc non Af Amer: >60 mL/min (ref >60)
Glucose, Bld: 128 mg/dL — ABNORMAL HIGH (ref 65–99)
POTASSIUM: 5.2 mmol/L — AB (ref 3.5–5.1)
SODIUM: 135 mmol/L (ref 135–145)

## 2016-11-13 LAB — CBC
HEMATOCRIT: 40 % (ref 36.0–46.0)
HEMOGLOBIN: 13.1 g/dL (ref 12.0–15.0)
MCH: 29.8 pg (ref 26.0–34.0)
MCHC: 32.8 g/dL (ref 30.0–36.0)
MCV: 91.1 fL (ref 78.0–100.0)
Platelets: 406 10*3/uL — ABNORMAL HIGH (ref 150–400)
RBC: 4.39 MIL/uL (ref 3.87–5.11)
RDW: 13.3 % (ref 11.5–15.5)
WBC: 18 10*3/uL — ABNORMAL HIGH (ref 4.0–10.5)

## 2016-11-13 NOTE — Progress Notes (Signed)
PROGRESS NOTE    Anne Olsen  BJY:[id number] DOB: 07-Apr-1976 DOA: 10/04/2016 PCP: No primary care provider on file.   Brief Narrative: 41 year old female with history of hypertension who was presented to [facility] on 10/04/2016 for headache, weakness and altered mental status. CT scan of head revealed an acute intraparenchymal hemorrhage in the right thalamus with hematoma. Patient was seen by neurosurgery and admitted in PCCM. Patient with prolonged hospitalization requiring mechanical ventilation. Patient is now on trach collar and has PEG tube. VP shunt was placed by neurosurgery. Patient was transferred from [facility] to [facility] on 11/04/2016, on 31th day of inpatient hospitalization.  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 -Follow-up neurosurgery for further care. -Patient is unresponsive. No change in mental status  #Agitation: The patient is more calm and comfortable today. -Neurology consult was obtained. CT scan of head with no acute change in findings. EEG with encephalopathy but no seizure-like activity. I discussed with Dr. Amada Jupiter from neurologist on 3/2. Started Haldol twice a day for agitation and started immediate release oxycodone  for pain management. Unable to use long-acting OxyContin through tube because it cannot be crushed.  -Patient looks comfortable therefore I'll continue current dose of dose of Haldol 1.5 twice a day and  oxycodone every 8 hourly. Continue to provide supportive care. Palliative care is also following. Patient will likely benefit from hospice this point since there is no significant clinical improvement.  -Also  Ativan as needed. -Follow-up neurologist evaluation and recommended admission. No clinical improvement.  #Acute on chronic hypoxic respiratory failure: -Currently has trach collar. No sign of bleeding. The tracheostomy tube was changed  by respiratory on 2/27.  -Patient has increased tracheal secretions which is significantly better than few days ago but  still requiring frequent suctioning. Patient has worsening leukocytosis. She is afebrile. I will check chest x-ray if patient benefits from antibiotics. Continue to monitor for now. Ccontinue current dose of glycopyrrolate. Continue breathing treatment. Respiratory therapist following.  #Acute febrile illness: Patient with intermittent elevated temperature likely central origin. Now afebrile -Neurosurgeon do not take it is related with VP shunt.  UA negative for UTI.  -Chest x-ray on 3/1  improving with no pneumonia. repeatinig CXR today -Blood culture negative. - ID consult appreciated. No further recommendation. -Leukocytosis and thrombocytosis likely in the setting of  Stress. Free water was held because of increased secretions. For now continue to monitor.  #Hyperkalemia: Patient with borderline potassium level on 3/5. Renal function is stable. Patient was on Jevity. The tube feed changed to Nepro. Treated with 2 doses of kayexalate. Serum K improved to 5.2 today.   #Hypoglycemia: Improved now. Blood sugar level acceptable now. Continue tube feed,   # Goals of care discussion:  -I had a long discussion with the patient husband and patient's mother on 2/25. Also discussed with the palliative care service. Patient is  DO NOT RESUSCITATE. No significant clinical improvement. Palliative care has been following. I discussed with the palliative care team on 3/3, patient likely benefit from social worker care evaluation.   Patient may need [facility] or [facility] on discharge. SW/CM evaluation ongoing for safe discharge planning.   Active Problems:   ICH (intracerebral hemorrhage) (HCC)   Hemorrhagic stroke (HCC)   Acute respiratory failure with hypoxia (HCC)   Hemorrhage   Nontraumatic subcortical hemorrhage of left cerebral hemisphere [facility])   Acute on chronic respiratory  failure (HCC)  Cerebrovascular accident (CVA) due to thrombosis of precerebral artery (HCC)   Tracheostomy care (HCC)   S/P ventriculoperitoneal shunt   PEG (percutaneous endoscopic gastrostomy) status (HCC)   Urinary retention   Hypoglycemia   Fever   Goals of care, counseling/discussion   Palliative care by specialist   Palliative care encounter   Increased tracheal secretions   Altered mental state   Pain management   Hyperkalemia   Leukocytosis   DVT prophylaxis: SCD. No anticoagulation because of intracranial hemorrhage Code Status: Full code Family Communication: Patient's mother at bedside Disposition Plan: Likely discharge to SNF or LTAC in 1-2 days when SNF bed is available to the patient. Social worker evaluation ongoing   Consultants:   Neurosurgery  Pulmonary critical care  Palliative care  Infectious disease  General neurology  Procedures:' 1/25 - Admit with AMS, intubated  CT HEAD CODE STROKE 1/25 >Acute intraparenchymal hemorrhage in the right thalamus with hematoma measuring 6.9 mL. Intraventricular penetration. Small amount of subarachnoid penetration. CTA of the neck and head 1/25 >do not show any atherosclerotic narrowing. The vessels are somewhat tortuous suggesting hypertension. No sign of aneurysm or vascular malformation to explain the right thalamic hemorrhage. CT HEAD 1/26 >decreased volume of right thalamic hemorrhage, intraventricular and subarachnoid hemorrhage, progressive hydrocephalus EEG 1/31>This EEG is consistent with a focal area of cerebral dysfunction in the right hemisphere superimposed on a generalized nonspecific cerebral dysfunction as can be seen with medication effect among other causes. ECHO>55-60% 1/26 - IVC drain placed  1/31 - PEG/Tra 2/04 - 24/7 ATC 2/21 - EVD remains in place, plan for OR today at 1430 for shunt placement.  2/23 - EVD in place. No Change in Neuro status   Antimicrobials: MRSA PCR 1/25:  Negative Blood Cultures x2 2/1: Negative Urine Culture 2/8: Klebsiella Blood Cultures x2 2/8: Negative Tracheal Aspirate Culture 2/8: Negative  CSF Culture 12/12: Negative CSF Culture 12/18 >>>  ANTIBIOTICS: Unasyn 1/26 - 2/1 Rocephin 2/8 - 2/14  LINES/TUBES: OETT 7.5 1/24 >1/31 Perc trach (DF) 1/31> PEG 1/31 >  Subjective: The patient was seen and examined at bedside. The tracheal secretions is better but still requiring frequent suctioning. More calm and comfortable. Patient's mother at bedside   Objective: Vitals:   11/13/16 0500 11/13/16 0554 11/13/16 0721 11/13/16 0930  BP:  (!) 118/99  132/80  Pulse:  94 99 99  Resp:  (!) 22 (!) 21 (!) 28  Temp:  99.6 F (37.6 C)  99.3 F (37.4 C)  TempSrc:  Axillary  Axillary  SpO2:  97% 100% 94%  Weight: 68.4 kg (150 lb 11.2 oz)     Height:       No intake or output data in the 24 hours ending 11/13/16 1106 Filed Weights   11/11/16 0500 11/12/16 0500 11/13/16 0500  Weight: 69.9 kg (154 lb) 68.7 kg (151 lb 7.3 oz) 68.4 kg (150 lb 11.2 oz)    Examination:  General exam: Ill-looking female who is calm and comfortable, no significant mental status change however looks more comfortable.  HEENT: Trach collar has thick secretions, no sign of bleeding. Respiratory system: Bibasal decreased breath sound, no wheezing Cardiovascular system: S1 & S2 heard, RRR.  trace pedal edema. Gastrointestinal system: Abdomen is soft, bowel sounds positive. PEG tube site clean. Central nervous system: Unresponsive Skin: No rashes, lesions or ulcers Psychiatry: Unable to assess as patient is unresponsive.    Data Reviewed: I have personally reviewed following labs and imaging studies  CBC:  Recent Labs  Lab 11/12/16 0805 11/13/16 0446  WBC 16.6* 18.0*  HGB 13.3 13.1  HCT 40.2 40.0  MCV 90.5 91.1  PLT 410* 406*   Basic Metabolic Panel:  Recent Labs Lab 11/12/16 0805 11/13/16 0446  NA 133* 135  K 5.5* 5.2*  CL 100* 99*  CO2  26 26  GLUCOSE 112* 128*  BUN 15 17  CREATININE 0.81 0.76  CALCIUM 9.1 9.2   GFR: Estimated Creatinine Clearance: 84.3 mL/min (by C-G formula based on SCr of 0.76 mg/dL). Liver Function Tests: No results for input(s): AST, ALT, ALKPHOS, BILITOT, PROT, ALBUMIN in the last 168 hours. No results for input(s): LIPASE, AMYLASE in the last 168 hours. No results for input(s): AMMONIA in the last 168 hours. Coagulation Profile: No results for input(s): INR, PROTIME in the last 168 hours. Cardiac Enzymes: No results for input(s): CKTOTAL, CKMB, CKMBINDEX, TROPONINI in the last 168 hours. BNP (last 3 results) No results for input(s): PROBNP in the last 8760 hours. HbA1C: No results for input(s): HGBA1C in the last 72 hours. CBG:  Recent Labs Lab 11/12/16 1143 11/12/16 1638 11/12/16 2007 11/13/16 0036 11/13/16 0404  GLUCAP 116* 121* 138* 119* 131*   Lipid Profile: No results for input(s): CHOL, HDL, LDLCALC, TRIG, CHOLHDL, LDLDIRECT in the last 72 hours. Thyroid Function Tests: No results for input(s): TSH, T4TOTAL, FREET4, T3FREE, THYROIDAB in the last 72 hours. Anemia Panel: No results for input(s): VITAMINB12, FOLATE, FERRITIN, TIBC, IRON, RETICCTPCT in the last 72 hours. Sepsis Labs: No results for input(s): PROCALCITON, LATICACIDVEN in the last 168 hours.  Recent Results (from the past 240 hour(s))  Culture, blood (routine x 2)     Status: None   Collection Time: 11/04/16 10:45 AM  Result Value Ref Range Status   Specimen Description BLOOD BLOOD RIGHT HAND  Final   Special Requests BOTTLES DRAWN AEROBIC ONLY 5CC  Final   Culture NO GROWTH 5 DAYS  Final   Report Status 11/09/2016 FINAL  Final  Culture, blood (routine x 2)     Status: None   Collection Time: 11/04/16 10:48 AM  Result Value Ref Range Status   Specimen Description BLOOD BLOOD RIGHT HAND  Final   Special Requests BOTTLES DRAWN AEROBIC ONLY 5CC  Final   Culture NO GROWTH 5 DAYS  Final   Report Status  11/09/2016 FINAL  Final         Radiology Studies: No results found.      Scheduled Meds: . amantadine  200 mg Per Tube BID  . bethanechol  10 mg Per Tube Q6H  . chlorhexidine  15 mL Mouth Rinse BID  . feeding supplement (NEPRO CARB STEADY)  1,000 mL Oral Q24H  . feeding supplement (PRO-STAT SUGAR FREE 64)  30 mL Per Tube BID  . glycopyrrolate  1 mg Oral TID  . haloperidol  1.5 mg Per Tube BID  . heparin subcutaneous  5,000 Units Subcutaneous Q8H  . oxyCODONE  5 mg Oral TID  . pantoprazole sodium  40 mg Per Tube Q1200  . polyethylene glycol  17 g Oral Daily  . scopolamine  1 patch Transdermal Q72H  . senna-docusate  1 tablet Oral BID   Continuous Infusions:    LOS: 40 days    Dron Jaynie Collins, MD [facility] Pager [phone]  If 7PM-7AM, please contact night-coverage [email or web address] Password [email or web address] 11/13/2016, 11:06 AM

## 2016-11-13 NOTE — Progress Notes (Signed)
[facility] following patient for potential admission. [facility] will not offer a bed at this time.  CM following for discharge disposition. CSW updated.

## 2016-11-13 NOTE — Progress Notes (Signed)
Due to increase of oxygen needs over the weekend, fevers and suctioning needs patient [facility]. CM spoke to the patients mother and husband and they are in agreement for [facility] and [facility] to look over the patient's information and run her insurance as appropriate. [facility] and [facility] notified. CM following.

## 2016-11-13 NOTE — Clinical Social Work Note (Signed)
CSW continues to follow for discharge needs.  Orilla Templeman, CSW [phone]  

## 2016-11-14 DIAGNOSIS — R339 Retention of urine, unspecified: Secondary | ICD-10-CM

## 2016-11-14 DIAGNOSIS — R4182 Altered mental status, unspecified: Secondary | ICD-10-CM

## 2016-11-14 DIAGNOSIS — E162 Hypoglycemia, unspecified: Secondary | ICD-10-CM

## 2016-11-14 LAB — GLUCOSE, CAPILLARY
GLUCOSE-CAPILLARY: 120 mg/dL — AB (ref 65–99)
GLUCOSE-CAPILLARY: 108 mg/dL — AB (ref 65–99)
GLUCOSE-CAPILLARY: 113 mg/dL — AB (ref 65–99)
GLUCOSE-CAPILLARY: 113 mg/dL — AB (ref 65–99)
Glucose-Capillary: 106 mg/dL — ABNORMAL HIGH (ref 65–99)
Glucose-Capillary: 109 mg/dL — ABNORMAL HIGH (ref 65–99)
Glucose-Capillary: 113 mg/dL — ABNORMAL HIGH (ref 65–99)
Glucose-Capillary: 128 mg/dL — ABNORMAL HIGH (ref 65–99)

## 2016-11-14 NOTE — Progress Notes (Signed)
PROGRESS NOTE    Anne Olsen  WUJ:[id number] DOB: 06-12-1976 DOA: 10/04/2016 PCP: No primary care provider on file.   Brief Narrative: Anne Olsen is a 41 y.o. female with a history of hypertension that was found to have intraparenchymal hemorrhage in the right thalamus with a hematoma. She is underwent VP shunt by neurosurgery and was initially managed in the ICU. She is transferred out of ICU on the 25th and is currently awaiting placement for discharge.   Assessment & Plan:   Active Problems:   ICH (intracerebral hemorrhage) (HCC)   Hemorrhagic stroke (HCC)   Acute respiratory failure with hypoxia (HCC)   Hemorrhage   Nontraumatic subcortical hemorrhage of left cerebral hemisphere (HCC)   Acute on chronic respiratory failure (HCC)   Cerebrovascular accident (CVA) due to thrombosis of precerebral artery (HCC)   Tracheostomy care (HCC)   S/P ventriculoperitoneal shunt   PEG (percutaneous endoscopic gastrostomy) status (HCC)   Urinary retention   Hypoglycemia   Fever   Goals of care, counseling/discussion   Palliative care by specialist   Palliative care encounter   Increased tracheal secretions   Altered mental state   Pain management   Hyperkalemia   Leukocytosis  Intracranial hemorrhage Status post VP shunt placement or surgery on 2/23 for hydrocephalus. -Re-consult neurosurgery in the morning  Agitation Patient has been evaluated by neurology. They have signed off currently. -Continue Haldol agitation -Continue oxycodone for pain management  Acute on chronic hypoxic failure Continue trach collar  Hyperkalemia Improved -Repeat BMP in the morning  Hypoglycemia Resolved   DVT prophylaxis: SCDs. Chemoprophylaxis secondary to intracranial hemorrhage  Code Status: DO NOT RESUSCITATE  Family Communication: None at bedside  Disposition Plan: Discharge to SNF when bed available    Consultants:   Neurosurgery  Critical care medicine  Palliative  care medicine  Neurology  Infectious disease  Procedures: 1/25 - Admit with AMS, intubated  CT HEAD CODE STROKE 1/25 >Acute intraparenchymal hemorrhage in the right thalamus with hematoma measuring 6.9 mL. Intraventricular penetration. Small amount of subarachnoid penetration. CTA of the neck and head 1/25 >do not show any atherosclerotic narrowing. The vessels are somewhat tortuous suggesting hypertension. No sign of aneurysm or vascular malformation to explain the right thalamic hemorrhage. CT HEAD 1/26 >decreased volume of right thalamic hemorrhage, intraventricular and subarachnoid hemorrhage, progressive hydrocephalus EEG 1/31>This EEG is consistent with a focal area of cerebral dysfunction in the right hemisphere superimposed on a generalized nonspecific cerebral dysfunction as can be seen with medication effect among other causes. ECHO>55-60% 1/26 - IVC drain placed  1/31 - PEG/Tra 2/04 - 24/7 ATC 2/21 - EVD remains in place, plan for OR today at 1430 for shunt placement.  2/23 - EVD in place. No Change in Neuro status   Antimicrobials:  Unasyn (1/26-2/1)  Rocephin (2/8-2/14)    Subjective: Patient is unresponsive to questions  Objective: Vitals:   11/14/16 0915 11/14/16 1221 11/14/16 1648 11/14/16 1735  BP: 133/76   118/77  Pulse: 96 95 94 90  Resp: 18 16 18 20   Temp: 99.6 F (37.6 C)   99 F (37.2 C)  TempSrc: Axillary   Axillary  SpO2: 95%  97% 98%  Weight:      Height:        Intake/Output Summary (Last 24 hours) at 11/14/16 1743 Last data filed at 11/14/16 1130  Gross per 24 hour  Intake           1362.5 ml  Output  0 ml  Net           1362.5 ml   Filed Weights   11/12/16 0500 11/13/16 0500 11/14/16 0500  Weight: 68.7 kg (151 lb 7.3 oz) 68.4 kg (150 lb 11.2 oz) 70.3 kg (155 lb)    Examination:  General exam: Appears calm and comfortable Respiratory system: Diffuse rhonchi bilaterally with normal respiratory effort    Cardiovascular system: S1 & S2 heard, RRR. No murmur Gastrointestinal system: Abdomen is nondistended, soft and nontender. Normal bowel sounds heard. Central nervous system: Not alert oriented. Withdraws to painful stimuli Extremities: No edema. No calf tenderness Skin: No cyanosis. No rashes Psychiatry: Judgement and insight impaired secondary to altered mental status    Data Reviewed: I have personally reviewed following labs and imaging studies  CBC:  Recent Labs Lab 11/12/16 0805 11/13/16 0446  WBC 16.6* 18.0*  HGB 13.3 13.1  HCT 40.2 40.0  MCV 90.5 91.1  PLT 410* 406*   Basic Metabolic Panel:  Recent Labs Lab 11/12/16 0805 11/13/16 0446  NA 133* 135  K 5.5* 5.2*  CL 100* 99*  CO2 26 26  GLUCOSE 112* 128*  BUN 15 17  CREATININE 0.81 0.76  CALCIUM 9.1 9.2   GFR: Estimated Creatinine Clearance: 85.5 mL/min (by C-G formula based on SCr of 0.76 mg/dL). Liver Function Tests: No results for input(s): AST, ALT, ALKPHOS, BILITOT, PROT, ALBUMIN in the last 168 hours. No results for input(s): LIPASE, AMYLASE in the last 168 hours. No results for input(s): AMMONIA in the last 168 hours. Coagulation Profile: No results for input(s): INR, PROTIME in the last 168 hours. Cardiac Enzymes: No results for input(s): CKTOTAL, CKMB, CKMBINDEX, TROPONINI in the last 168 hours. BNP (last 3 results) No results for input(s): PROBNP in the last 8760 hours. HbA1C: No results for input(s): HGBA1C in the last 72 hours. CBG:  Recent Labs Lab 11/14/16 0450 11/14/16 0800 11/14/16 0851 11/14/16 1136 11/14/16 1623  GLUCAP 106* 108* 128* 113* 113*   Lipid Profile: No results for input(s): CHOL, HDL, LDLCALC, TRIG, CHOLHDL, LDLDIRECT in the last 72 hours. Thyroid Function Tests: No results for input(s): TSH, T4TOTAL, FREET4, T3FREE, THYROIDAB in the last 72 hours. Anemia Panel: No results for input(s): VITAMINB12, FOLATE, FERRITIN, TIBC, IRON, RETICCTPCT in the last 72  hours. Sepsis Labs: No results for input(s): PROCALCITON, LATICACIDVEN in the last 168 hours.  No results found for this or any previous visit (from the past 240 hour(s)).       Radiology Studies: Dg Chest Port 1 View  Result Date: 11/13/2016 CLINICAL DATA:  Leukocytosis. EXAM: PORTABLE CHEST 1 VIEW COMPARISON:  11/08/2016 FINDINGS: 1030 hours. Low volume film with asymmetric elevation right hemidiaphragm, stable. Cardiopericardial silhouette is at upper limits of normal for size. There is pulmonary vascular congestion without overt pulmonary edema in the degree of congestive change appears decreased in the interval. Tracheostomy tube and shunt catheter again noted. IMPRESSION: Interval decrease and pulmonary vascular congestion. Electronically Signed   By: Kennith Center M.D.   On: 11/13/2016 11:44        Scheduled Meds: . amantadine  200 mg Per Tube BID  . bethanechol  10 mg Per Tube Q6H  . chlorhexidine  15 mL Mouth Rinse BID  . feeding supplement (NEPRO CARB STEADY)  1,000 mL Oral Q24H  . feeding supplement (PRO-STAT SUGAR FREE 64)  30 mL Per Tube BID  . glycopyrrolate  1 mg Oral TID  . haloperidol  1.5 mg Per Tube BID  .  heparin subcutaneous  5,000 Units Subcutaneous Q8H  . oxyCODONE  5 mg Oral TID  . pantoprazole sodium  40 mg Per Tube Q1200  . polyethylene glycol  17 g Oral Daily  . scopolamine  1 patch Transdermal Q72H  . senna-docusate  1 tablet Oral BID   Continuous Infusions:   LOS: 41 days     Jacquelin Hawking [facility] 11/14/2016, 5:43 PM Pager: [phone]  If 7PM-7AM, please contact night-coverage [email or web address] Password [facility] 11/14/2016, 5:43 PM

## 2016-11-14 NOTE — Progress Notes (Signed)
[facility] has not gotten response from patients insurance regarding admission. CSW aware and continuing to pursue SNF placement. CSW to discuss case and possible LOG in LOS meeting tomorrow. CM continuing to follow.

## 2016-11-14 NOTE — Clinical Social Work Note (Addendum)
Patient's husband applied for both disability and Medicaid on March 1. SNF notified.  Charlynn CourtSarah Grazia Taffe, CSW [phone]  1:51 pm Discussed case with hospital liason [facility]. She stated that insurance will likely not cover patient as she appears to be more custodial care. Discussed possible LOG with Chiropodistassistant director of social work. He is unable to approve at this time because patient has private insurance. He asked that CSW discuss private pay with patient's husband. [facility] has rescinded their bed offer but [facility] as private pay or LOG. Patient's husband would have to pay about $7000 per month. CSW called and discussed barriers with patient's husband. He is unable to pay privately. CSW called and left voicemail with Chiropodistassistant director of social work to provide further information and discuss possible LOG again.  Charlynn CourtSarah Zanai Mallari, CSW [phone]  2:56 pm ChiropodistAssistant director of social work still does not believe patient is LOG appropriate. Will discuss case with medical director of RNCM/CSW depts in quality collaborative meeting tomorrow morning.  Charlynn CourtSarah Annastasia Haskins, CSW [phone]

## 2016-11-15 ENCOUNTER — Inpatient Hospital Stay (HOSPITAL_COMMUNITY): Payer: 59

## 2016-11-15 LAB — GLUCOSE, CAPILLARY
Glucose-Capillary: 100 mg/dL — ABNORMAL HIGH (ref 65–99)
Glucose-Capillary: 112 mg/dL — ABNORMAL HIGH (ref 65–99)
Glucose-Capillary: 117 mg/dL — ABNORMAL HIGH (ref 65–99)

## 2016-11-15 LAB — BASIC METABOLIC PANEL
Anion gap: 8 (ref 5–15)
BUN: 21 mg/dL — AB (ref 6–20)
CHLORIDE: 99 mmol/L — AB (ref 101–111)
CO2: 27 mmol/L (ref 22–32)
CREATININE: 0.82 mg/dL (ref 0.44–1.00)
Calcium: 9.1 mg/dL (ref 8.9–10.3)
GFR calc Af Amer: >60 mL/min (ref >60)
Glucose, Bld: 103 mg/dL — ABNORMAL HIGH (ref 65–99)
Potassium: 5 mmol/L (ref 3.5–5.1)
Sodium: 134 mmol/L — ABNORMAL LOW (ref 135–145)

## 2016-11-15 LAB — URINALYSIS, ROUTINE W REFLEX MICROSCOPIC
Bilirubin Urine: NEGATIVE
Glucose, UA: NEGATIVE mg/dL
Hgb urine dipstick: NEGATIVE
Ketones, ur: NEGATIVE mg/dL
Leukocytes, UA: NEGATIVE
NITRITE: NEGATIVE
PH: 5 (ref 5.0–8.0)
Protein, ur: NEGATIVE mg/dL
SPECIFIC GRAVITY, URINE: 1.023 (ref 1.005–1.030)

## 2016-11-15 LAB — CBC
HEMATOCRIT: 38.7 % (ref 36.0–46.0)
HEMOGLOBIN: 12.5 g/dL (ref 12.0–15.0)
MCH: 29.8 pg (ref 26.0–34.0)
MCHC: 32.3 g/dL (ref 30.0–36.0)
MCV: 92.4 fL (ref 78.0–100.0)
Platelets: 392 10*3/uL (ref 150–400)
RBC: 4.19 MIL/uL (ref 3.87–5.11)
RDW: 13.7 % (ref 11.5–15.5)
WBC: 14.4 10*3/uL — ABNORMAL HIGH (ref 4.0–10.5)

## 2016-11-15 MED ORDER — HALOPERIDOL 0.5 MG PO TABS: 1.5000 mg | ORAL_TABLET | Freq: Two times a day (BID) | ORAL | Status: AC

## 2016-11-15 MED ORDER — OXYCODONE HCL 5 MG PO TABS: 5.0000 mg | ORAL_TABLET | Freq: Three times a day (TID) | ORAL | 0 refills | Status: DC

## 2016-11-15 MED ORDER — SCOPOLAMINE 1 MG/3DAYS TD PT72: 1.0000 | MEDICATED_PATCH | TRANSDERMAL | Status: AC

## 2016-11-15 MED ORDER — PRO-STAT SUGAR FREE PO LIQD: 30.0000 mL | Freq: Two times a day (BID) | ORAL | 0 refills | Status: AC

## 2016-11-15 MED ORDER — BETHANECHOL CHLORIDE 10 MG PO TABS: 10.0000 mg | ORAL_TABLET | Freq: Four times a day (QID) | ORAL | Status: AC

## 2016-11-15 MED ORDER — PANTOPRAZOLE SODIUM 40 MG PO PACK: 40.0000 mg | PACK | Freq: Every day | ORAL | Status: AC

## 2016-11-15 MED ORDER — CHLORHEXIDINE GLUCONATE 0.12 % MT SOLN: 15.0000 mL | Freq: Two times a day (BID) | OROMUCOSAL | Status: AC

## 2016-11-15 MED ORDER — SENNOSIDES-DOCUSATE SODIUM 8.6-50 MG PO TABS: 1.0000 | ORAL_TABLET | Freq: Two times a day (BID) | ORAL | Status: AC

## 2016-11-15 MED ORDER — BISACODYL 10 MG RE SUPP: 10.0000 mg | Freq: Every day | RECTAL | 0 refills | Status: AC | PRN

## 2016-11-15 MED ORDER — NEPRO/CARBSTEADY PO LIQD: 1000.0000 mL | ORAL | Status: DC

## 2016-11-15 MED ORDER — AMANTADINE HCL 50 MG/5ML PO SYRP: 200.0000 mg | ORAL_SOLUTION | Freq: Two times a day (BID) | ORAL | Status: AC

## 2016-11-15 MED ORDER — GLYCOPYRROLATE 1 MG PO TABS: 1.0000 mg | ORAL_TABLET | Freq: Three times a day (TID) | ORAL | Status: AC

## 2016-11-15 MED ORDER — POLYETHYLENE GLYCOL 3350 17 G PO PACK: 17.0000 g | PACK | Freq: Every day | ORAL | 0 refills | Status: AC

## 2016-11-15 MED ORDER — IPRATROPIUM-ALBUTEROL 0.5-2.5 (3) MG/3ML IN SOLN: 3.0000 mL | Freq: Four times a day (QID) | RESPIRATORY_TRACT | Status: DC | PRN

## 2016-11-15 NOTE — Progress Notes (Signed)
Report was called in to [facility]. Family updated.

## 2016-11-15 NOTE — Clinical Social Work Note (Addendum)
30-day LOG approved for SNF. This will be extended only once until decision made by Medicaid. [facility] CSW received call from [facility] that they are expecting a decision by insurance today and are hopeful. There is potential for a peer-to-peer review. CSW paged MD to notify.   Charlynn CourtSarah Yong Wahlquist, CSW [phone]  12:26 pm CSW called and updated patient's husband. Discussed possible SNF placement and the potential for having to either private pay or taking the patient home with home health. Patient's husband stated that he wants to see if there is any improvement at that time. If there is no improvement, he is agreeable to "letting her go." CSW answered all questions. [facility] on a Friday or over the weekend and want her at the SNF at 4:30 at the latest today if she is coming there. Otherwise they cannot take her until Monday.  Charlynn CourtSarah Timiya Howells, CSW [phone]

## 2016-11-15 NOTE — Clinical Social Work Note (Signed)
CSW facilitated patient discharge including contacting patient family and facility to confirm patient discharge plans. Clinical information faxed to facility and family agreeable with plan. CSW arranged ambulance transport via [facility] to [facility]. RN to call report prior to discharge ([phone]).  CSW will sign off for now as social work intervention is no longer needed. Please consult us again if new needs arise.  Alishea Beaudin, CSW [phone]   

## 2016-11-15 NOTE — Discharge Summary (Signed)
Physician Discharge Summary  Anne Olsen ZOX:[id number] DOB: [birth year] DOA: 10/04/2016  PCP: No primary care provider on file.  Admit date: 10/04/2016 Discharge date: 11/15/2016  Admitted From: Home Disposition: SNF  Recommendations for Outpatient Follow-up:  1. Follow up with PCP in 1 week 2. We'll need to consider hospice/comfort care discussions as an outpatient. Recommend palliative care consult to follow patient as an outpatient for now. 3. Please obtain BMP/CBC in one week  4. Please follow up on the following pending results: Urine culture 5. Follow up with Neurosurgery 6. Follow up with Neurology  Equipment/Devices: Trach collar  Discharge Condition: Guarded CODE STATUS: DNR/DNI Diet recommendation: NPO, Tube feeding   Brief/Interim Summary:  Admission HPI written by Rory Percy, MD   History of Present Illness: Patient is a 41 yo F pmhx of HTN who presented today via EMS for headache, weakness, and AMS. History was provided entirely by the husband [place]. 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 He got home from work around 5 pm when she developed a sudden headache and screamed out loud. Patient told him she had never had a headache like this before. She developed weakness on her left side and became very sleepy. Husband said he tried feeding her and putting her to bed, but she became more and more lethargic and he called 911.   On arrival to the ED, blood pressure was 161/132. Pupils were noted to be unequal. She was very lethargic on non rebreather. She subsequently became unresponsive and was intubated for airway protection and started on propofol for sedation. Repeat blood pressure was 196/95. CT head revealed an acute intraparenchymal hemorrhage in the right thalamus with a hematoma measuring 6.9 mL. Per husband, patient's only other medical problems include issues with high potassium. She started  taking birth control pills about 2 months ago. She exercises regularly and is otherwise healthy. Husband is unaware of hypertension or aneurysms that run in her family.     Hospital course:  Intracranial hemorrhage Patient presented to [facility] on 10/04/2016 for headache weakness and altered mental status. CT scan of the head revealed an acute intra-parenchymal hemorrhage in the right thalamus with hematoma. Neurosurgery and critical care management patient on admission. Patient required hypoventilation and a VP shunt was placed by neurosurgery on 11/02/2016. Mechanical ventilation was weaned off and patient is currently on trach collar." Were discussed and probably have tired for patient to have everything done but have her as a DO NOT RESUSCITATE/DO NOT INTUBATE. PEG tube was placed on 1/31. She was initially on Jevity which was transitioned to Nepro. Haldol for restlessness and agitation. Oxycodone for pain. Wound Care was consulted. Hospice was entertained, however family not ready at this time to consider this. Will need follow-up as an outpatient.  Acute on chronic hypoxic respiratory failure Patient was intubated as stated above. Now on trach collar. Glycopyrrolate for secretions. Tracheal aspirate culture was obtained prior to discharge for increased secretions. Chest x-ray did not show acute process for pneumonia.  Acute febrile illness Evaluated by neurosurgery did not feel this is related to VP shunt. Likely origin. Multiple blood cultures negative. Infectious disease had been consulted with no further recommendations for treatment. The cytosis had increased but is trending down now. Urine culture is pending.  Hyperkalemia Tube feedingd were changed to alleviate this. She received two doses of Kayexalate.  Hypoglycemia Resolved with tube feeding.  Urinary retention Patient has been receiving  in/out intermittent catheterization  Discharge Diagnoses:  Active Problems:    ICH (intracerebral hemorrhage) (HCC)   Hemorrhagic stroke (HCC)   Acute respiratory failure with hypoxia (HCC)   Hemorrhage   Nontraumatic subcortical hemorrhage of left cerebral hemisphere [facility])   Acute on chronic respiratory failure (HCC)   Cerebrovascular accident (CVA) due to thrombosis of precerebral artery (HCC)   Tracheostomy care (HCC)   S/P ventriculoperitoneal shunt   PEG (percutaneous endoscopic gastrostomy) status (HCC)   Urinary retention   Hypoglycemia   Fever   Goals of care, counseling/discussion   Palliative care by specialist   Palliative care encounter   Increased tracheal secretions   Altered mental state   Pain management   Hyperkalemia   Leukocytosis    Discharge Instructions   Allergies as of 11/15/2016      Reactions   No Known Allergies       Medication List    TAKE these medications   amantadine 50 mg/ 5 mL solution Commonly known as:  SYMMETREL Place 20 mLs (200 mg total) into feeding tube 2 (two) times daily.   bethanechol 10 MG tablet Commonly known as:  URECHOLINE Place 1 tablet (10 mg total) into feeding tube every 6 (six) hours.   bisacodyl 10 MG suppository Commonly known as:  DULCOLAX Place 1 suppository (10 mg total) rectally daily as needed for moderate constipation.   chlorhexidine 0.12 % solution Commonly known as:  PERIDEX 15 mLs by Mouth Rinse route 2 (two) times daily.   feeding supplement (NEPRO CARB STEADY) Liqd Take 1,000 mLs by mouth daily. Start taking on:  11/16/2016   feeding supplement (PRO-STAT SUGAR FREE 64) Liqd Place 30 mLs into feeding tube 2 (two) times daily.   glycopyrrolate 1 MG tablet Commonly known as:  ROBINUL Take 1 tablet (1 mg total) by mouth 3 (three) times daily.   haloperidol 0.5 MG tablet Commonly known as:  HALDOL Place 3 tablets (1.5 mg total) into feeding tube 2 (two) times daily.   ipratropium-albuterol 0.5-2.5 (3) MG/3ML Soln Commonly known as:  DUONEB Take 3 mLs by nebulization  every 6 (six) hours as needed.   oxyCODONE 5 MG immediate release tablet Commonly known as:  Oxy IR/ROXICODONE Take 1 tablet (5 mg total) by mouth 3 (three) times daily.   pantoprazole sodium 40 mg/20 mL Pack Commonly known as:  PROTONIX Place 20 mLs (40 mg total) into feeding tube daily at 12 noon. Start taking on:  11/16/2016   polyethylene glycol packet Commonly known as:  MIRALAX / GLYCOLAX Take 17 g by mouth daily. Start taking on:  11/16/2016   scopolamine 1 MG/3DAYS Commonly known as:  TRANSDERM-SCOP Place 1 patch (1.5 mg total) onto the skin every 3 (three) days. Start taking on:  11/16/2016   senna-docusate 8.6-50 MG tablet Commonly known as:  Senokot-S Take 1 tablet by mouth 2 (two) times daily.       Contact information for follow-up providers    Xu,Jindong, MD. Schedule an appointment as soon as possible for a visit in 6 week(s).   Specialty:  Neurology Contact information: [address] [place] [address] [phone]            Contact information for after-discharge care    Destination    [facility] .   Specialty:  Skilled Nursing Chief of Staff information: [address] [place] [address] [phone]  Allergies  Allergen Reactions  . No Known Allergies     Consultations:  Neurosurgery  Critical care medicine  Palliative care medicine  Neurology  Infectious disease   Procedures/Studies: Ct Head Wo Contrast  Result Date: 11/08/2016 CLINICAL DATA:  Followup intracranial hemorrhage October 04, 2016. Now with abnormal RIGHT-sided movements today. EXAM: CT HEAD WITHOUT CONTRAST TECHNIQUE: Contiguous axial images were obtained from the base of the skull through the vertex without intravenous contrast. COMPARISON:  CT HEAD November 02, 2016 and multiple priors. FINDINGS: BRAIN: Interval placement of ventriculoperitoneal shunt via RIGHT parietal burr hole  with distal tip traversing midline within LEFT frontal horn of lateral ventricle. Slit-like RIGHT lateral ventricle. Interval removal of RIGHT frontal ventriculostomy catheter. RIGHT thalamus and RIGHT midbrain infarct. No intraparenchymal hemorrhage, mass effect or acute large vascular territory infarcts. Persistent mild effacement of the basal cisterns. VASCULAR: Unremarkable. SKULL/SOFT TISSUES: No skull fracture. No significant soft tissue swelling. ORBITS/SINUSES: The included ocular globes and orbital contents are normal.The mastoid aircells and included paranasal sinuses are well-aerated. OTHER: None. IMPRESSION: Interim removal of RIGHT frontal ventriculostomy. New RIGHT parietal ventriculoperitoneal shunt with distal tip in the LEFT frontal horn of the lateral ventricle. Resolved hydrocephalus, slit-like RIGHT lateral ventricle. Old RIGHT thalamus/ midbrain infarct. Electronically Signed   By: Awilda Metro M.D.   On: 11/08/2016 23:13   Ct Head Wo Contrast  Result Date: 11/02/2016 CLINICAL DATA:  Intracranial hemorrhage follow up EXAM: CT HEAD WITHOUT CONTRAST TECHNIQUE: Contiguous axial images were obtained from the base of the skull through the vertex without intravenous contrast. COMPARISON:  Head CT 10/31/2016 FINDINGS: Brain: Right frontal approach extraventricular drain catheter tip is in unchanged position. The size of the ventricles has decreased slightly, most noticeably at the third ventricle and temporal horns of the lateral ventricles. There is persistent periventricular hypoattenuation suggesting transependymal CSF flow. There is no acute hemorrhage. The basal cisterns remain crowded and the cerebral aqueduct remains effaced. Vascular: No hyperdense vessel or unexpected calcification. Skull: Right frontal burr hole. Sinuses/Orbits: The visualized portions of the paranasal sinuses and mastoid air cells are free of fluid. No advanced mucosal thickening. The visualized orbits are normal.  Other: None IMPRESSION: 1. Slightly decreased size of the lateral and third ventricles with unchanged position of EVD catheter. Persistent periventricular hypoattenuation consistent with transependymal CSF resorption. 2. Cerebral aqueduct remains effaced. 3. No acute hemorrhage or new mass effect. Electronically Signed   By: Deatra Robinson M.D.   On: 11/02/2016 04:59   Ct Head Wo Contrast  Addendum Date: 10/31/2016   ADDENDUM REPORT: 10/31/2016 17:06 ADDENDUM: Case was discussed on 10/31/2016  at 5:05 pm with Dr. Marikay Alar. Electronically Signed   By: Marnee Spring M.D.   On: 10/31/2016 17:06   Result Date: 10/31/2016 CLINICAL DATA:  Altered mental status and hypertension EXAM: CT HEAD WITHOUT CONTRAST TECHNIQUE: Contiguous axial images were obtained from the base of the skull through the vertex without intravenous contrast. COMPARISON:  Yesterday FINDINGS: Brain: Marked dilatation of the lateral and third ventricles, unchanged from yesterday. There is transependymal CSF re- absorption and presumed flow along the right frontal ventriculostomy. Prior to the patient's CT she experienced apnea after EVD clamping in OR holding. Case discussed with ICU RN Tammy and findings are known to Dr Yetta Barre, the EVD is working, and patient has returned to pre clamping baseline. The EVD is in stable position, tip at the anterior septum pellucidum after traversing the frontal horn right lateral ventricle. Subacute low-density hematoma in the midbrain  with fourth ventricular effacement. Vascular: No hyperdense vessel or unexpected calcification. Skull: Negative Sinuses/Orbits: Negative IMPRESSION: 1. 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 Degree is unchanged from yesterday. EVD positioning is stable. 2. Subacute hematoma in the midbrain. Electronically Signed: By: Marnee Spring M.D. On: 10/31/2016 16:56   Ct Head Wo  Contrast  Result Date: 10/30/2016 CLINICAL DATA:  Intra cerebral hemorrhage.  Hydrocephalus. EXAM: CT HEAD WITHOUT CONTRAST TECHNIQUE: Contiguous axial images were obtained from the base of the skull through the vertex without intravenous contrast. COMPARISON:  CT head 10/24/2016, 10/04/2017 FINDINGS: Brain: Progressive hydrocephalus. Biventricular distance now measures 44.5 mm compared with 42.3 mm previously. Third and lateral ventricles are dilated. Fourth ventricle is small due to obstructive hydrocephalus. Right frontal ventricular drain remains in good position in the right frontal horn. Prior hemorrhage in the midbrain and thalamus on the right is now low-density. This extends into the right middle cerebellar peduncle. No acute hemorrhage. No subdural fluid collection. Periventricular white matter hypodensity is progressed likely due to transependymal absorption of CSF. No acute infarct. Vascular: No hyperdense vessel or unexpected calcification.Negative Skull: Negative Sinuses/Orbits: Negative Other: None IMPRESSION: Progressive obstructive hydrocephalus compared with the prior CT of 10/24/2016. Right ventricular drain remains in good position. Progression of transependymal resorption of CSF in the cerebral white matter. 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 No acute high-density hemorrhage. Electronically Signed   By: Marlan Palau M.D.   On: 10/30/2016 20:48   Ct Head Wo Contrast  Result Date: 10/24/2016 CLINICAL DATA:  41 y/o  F; hydrocephalus with shunt. EXAM: CT HEAD WITHOUT CONTRAST TECHNIQUE: Contiguous axial images were obtained from the base of the skull through the vertex without intravenous contrast. COMPARISON:  10/19/2016 MRI of the head.  10/14/2016 CT of the head. FINDINGS: Brain: Decreased attenuation of intra-axial hematoma centered in the right thalamus and midbrain and hemorrhage within occipital horns of  lateral ventricles. Hypoattenuation of the brain with swelling of the brainstem compatible with edema is grossly stable. Hydrocephalus has worsened with increased size of lateral and third ventricles, diffuse sulcal effacement, and a minimal increase in tonsillar herniation and crowding in the posterior fossa. Lucency along the tract of right frontal approach ventriculostomy catheter is stable. The catheter tip abuts the septum pellucidum and frontal horn of right lateral ventricle. No evidence for new large acute infarct or hemorrhage. Vascular: No hyperdense vessel or unexpected calcification. Skull: Normal. Negative for fracture or focal lesion. Sinuses/Orbits: No acute finding. Other: None. IMPRESSION: 1. Worsening hydrocephalus from prior MRI and CT with diffuse sulcal effacement and possible slight increase in cerebellar tonsillar herniation. 2. Decreased attenuation of midbrain and thalamus hemorrhage and stable associated edema and swelling of the brainstem. 3. No evidence for new large acute infarct or hemorrhage. These results will be called to the ordering clinician or representative by the Radiologist Assistant, and communication documented in the PACS or zVision Dashboard. Electronically Signed   By: Mitzi Hansen M.D.   On: 10/24/2016 14:31   Mr Brain Wo Contrast  Result Date: 10/19/2016 CLINICAL DATA:  41 year old female status post intracranial hemorrhage at the right thalamus with brainstem and intraventricular extension. EVD placement. Query brainstem involvement. Initial encounter. EXAM: MRI HEAD WITHOUT CONTRAST TECHNIQUE: Multiplanar, multiecho pulse sequences of the brain and surrounding structures were obtained without intravenous contrast. COMPARISON:  Head CT 10/14/2016 and earlier. FINDINGS: Brain: Intra-axial hemorrhage tracking from  the thalamus into the dorsal brainstem to the level of the fourth ventricle is mostly T2 hyperintense and is T1 heterogeneous. The intra-axial  component hemorrhage encompasses 22 x 29 x 44 mm, for an estimated blood volume of 14 mL. There is parenchymal edema along the course of the hemorrhage, maximal along the right thalamus at the internal capsule, severe in the midbrain and pons (series 8, image 11 and series 12, image 15), and also involving the right middle cerebellar peduncle. There is subsequent expansion of the midbrain. The ambient cisterns are partially effaced. Right frontal approach EVD in place, the catheter terminates at the anterior septum pellucidum. Small volume of intraventricular hemorrhage now primarily layering in the occipital horns (series 7, image 11). Lateral and third ventriculomegaly is stable or slightly improved compared to 10/14/2016. There is evidence of some transependymal edema. No restricted diffusion or evidence of acute infarction. The pre pontine and pre medullary cisterns are normal. There is partial effacement of the suprasellar cistern and cisterna magna. No supratentorial midline shift. Pituitary and cervicomedullary junction are normal. Vascular: Major intracranial vascular flow voids are preserved, with a degree of generalized intracranial artery dolichoectasia. Skull and upper cervical spine: Negative. Sinuses/Orbits: Negative orbits soft tissues. Small fluid levels with mucosal thickening in both sphenoid sinuses. Mild right mastoid effusion. Other: Visible internal auditory structures appear normal. Small volume retained secretions in the nasopharynx. Negative scalp soft tissues. IMPRESSION: 1. Intra-axial hematoma tracking from the right thalamus to the dorsal brainstem at the level of the fourth ventricle. Blood volume estimated at 14 mL. Edema along the course of the hemorrhage, especially in the right thalamus and midbrain. 2. Right frontal approach EVD terminates in the right lateral ventricle near the anterior septum pellucidum. Lateral and third ventriculomegaly with some transependymal edema is stable  or perhaps slightly improved since 10/14/2016. Trace residual intraventricular hemorrhage. Electronically Signed   By: Odessa Fleming M.D.   On: 10/19/2016 06:48   Dg Chest Port 1 View  Result Date: 11/15/2016 CLINICAL DATA:  Fever. EXAM: PORTABLE CHEST 1 VIEW COMPARISON:  Radiographs of November 13, 2016. FINDINGS: Stable cardiomediastinal silhouette. Tracheostomy tube is unchanged in position. Right-sided ventriculoperitoneal shunt is again noted and unchanged. No pneumothorax or pleural effusion is noted. No acute pulmonary disease is noted. Bony thorax is unremarkable. IMPRESSION: No acute cardiopulmonary abnormality seen. Electronically Signed   By: Lupita Raider, M.D.   On: 11/15/2016 10:53   Dg Chest Port 1 View  Result Date: 11/13/2016 CLINICAL DATA:  Leukocytosis. EXAM: PORTABLE CHEST 1 VIEW COMPARISON:  11/08/2016 FINDINGS: 1030 hours. Low volume film with asymmetric elevation right hemidiaphragm, stable. Cardiopericardial silhouette is at upper limits of normal for size. There is pulmonary vascular congestion without overt pulmonary edema in the degree of congestive change appears decreased in the interval. Tracheostomy tube and shunt catheter again noted. IMPRESSION: Interval decrease and pulmonary vascular congestion. Electronically Signed   By: Kennith Center M.D.   On: 11/13/2016 11:44   Dg Chest Port 1 View  Result Date: 11/08/2016 CLINICAL DATA:  Tracheal secretions. EXAM: PORTABLE CHEST 1 VIEW COMPARISON:  11/04/2016 . FINDINGS: Tracheostomy tube in stable position. Ventricular peritoneal shunt tubing in stable position. Borderline cardiomegaly. Mild pulmonary vascular prominence and interstitial prominence. Mild CHF cannot be excluded. Mild pneumonitis cannot be excluded. Low lung volumes. No pneumothorax. IMPRESSION: 1. Tracheostomy tube and stent and VP shunt tubing in stable position. 2. Borderline cardiomegaly with mild bilateral interstitial prominence. Mild CHF cannot be excluded. Mild  pneumonitis  cannot be excluded. 3. Low lung volumes. Electronically Signed   By: Maisie Fus  Register   On: 11/08/2016 13:14   Dg Chest Port 1 View  Result Date: 11/04/2016 CLINICAL DATA:  Fever.  VP shunt insertion 11/02/2016 EXAM: PORTABLE CHEST 1 VIEW COMPARISON:  11/01/2016 FINDINGS: Tracheostomy remains in good position. Improved aeration in the lung bases with decreased airspace disease. Mild residual right lower lobe airspace disease. No effusion Interval placement of ventricular peritoneal shunt tubing overlying the right chest and abdomen. IMPRESSION: Tracheostomy remains in good position Improved aeration in the lung bases compared with the prior study. Electronically Signed   By: Marlan Palau M.D.   On: 11/04/2016 13:53   Dg Chest Port 1 View  Result Date: 11/01/2016 CLINICAL DATA:  Respiratory distress. EXAM: PORTABLE CHEST 1 VIEW COMPARISON:  One-view chest x-ray 10/24/2016 FINDINGS: The tracheostomy tube is stable. The heart size is exaggerated by low lung volumes. A diffuse interstitial pattern has increased, suggesting edema. No significant airspace consolidation is present. The visualized soft tissues and bony thorax are unremarkable. IMPRESSION: 1. Increasing interstitial pattern suggesting edema. Electronically Signed   By: Marin Roberts M.D.   On: 11/01/2016 13:06   Dg Chest Port 1 View  Result Date: 10/24/2016 CLINICAL DATA:  Respiratory failure EXAM: PORTABLE CHEST 1 VIEW COMPARISON:  10/23/2016 FINDINGS: Tracheostomy tube is again seen in satisfactory position. Cardiac shadow is within normal limits. The lungs are well aerated bilaterally. Minimal bibasilar atelectasis is seen. No focal confluent infiltrate or sizable effusion is noted. No bony abnormality is seen. IMPRESSION: Minimal basilar atelectasis. Electronically Signed   By: Alcide Clever M.D.   On: 10/24/2016 07:53   Dg Chest Port 1 View  Result Date: 10/23/2016 CLINICAL DATA:  CVA, respiratory failure,  tracheostomy patient. EXAM: PORTABLE CHEST 1 VIEW COMPARISON:  Portable chest x-ray of October 18, 2016 FINDINGS: The lungs are mildly hypoinflated. Bibasilar atelectasis persists. The pulmonary interstitial markings have normalized. The heart is top-normal in size but stable. The tracheostomy appliance tip projects at the inferior margin of the clavicular heads. IMPRESSION: Improved appearance of the pulmonary interstitium consistent with decreased edema. Persistent bibasilar atelectasis. Electronically Signed   By: David  [place] M.D.   On: 10/23/2016 07:09   Dg Chest Portable 1 View  Result Date: 10/18/2016 CLINICAL DATA:  Respiratory failure EXAM: PORTABLE CHEST 1 VIEW COMPARISON:  October 15, 2016 FINDINGS: Tracheostomy catheter tip is 3.7 cm above the carina. No pneumothorax. There is patchy atelectasis in each lung base, slightly more on the left than on the right. Lungs elsewhere are clear. Heart is upper normal in size with pulmonary vascular within normal limits. No adenopathy. No bone lesions. IMPRESSION: No pneumothorax. Bibasilar atelectasis, more on the left than on the right, stable. A small focus of pneumonia in the left base cannot be excluded radiographically. Electronically Signed   By: Bretta Bang III M.D.   On: 10/18/2016 08:27    1/25 - Admit with AMS, intubated  CT HEAD CODE STROKE 1/25 >Acute intraparenchymal hemorrhage in the right thalamus with hematoma measuring 6.9 mL. Intraventricular penetration. Small amount of subarachnoid penetration. CTA of the neck and head 1/25 >do not show any atherosclerotic narrowing. The vessels are somewhat tortuous suggesting hypertension. No sign of aneurysm or vascular malformation to explain the right thalamic hemorrhage. CT HEAD 1/26 >decreased volume of right thalamic hemorrhage, intraventricular and subarachnoid hemorrhage, progressive hydrocephalus EEG 1/31>This EEG is consistent with a focal area of cerebral dysfunction in the  right hemisphere  superimposed on a generalized nonspecific cerebral dysfunction as can be seen with medication effect among other causes. ECHO>55-60% 1/26 - IVC drain placed  1/31 - PEG/Tra 2/04 - 24/7 ATC 2/21 - EVD remains in place, plan for OR today at 1430 for shunt placement.  2/23 - EVD in place. No Change in Neuro status    Subjective: Patient not responsive to questions.  Discharge Exam: Vitals:   11/15/16 1035 11/15/16 1352  BP:  127/87  Pulse:  81  Resp:  20  Temp: (!) 101 F (38.3 C) 97.7 F (36.5 C)   Vitals:   11/15/16 1000 11/15/16 1035 11/15/16 1118 11/15/16 1352  BP: 121/80   127/87  Pulse: 92   81  Resp: 20   20  Temp: 99.5 F (37.5 C) (!) 101 F (38.3 C)  97.7 F (36.5 C)  TempSrc: Axillary Rectal  Axillary  SpO2: 93%  93% 95%  Weight:      Height:        General exam: Appears calm and comfortable Respiratory system: Diffuse rhonchi bilaterally with normal respiratory effort  Cardiovascular system: S1 & S2 heard, RRR. No murmur Gastrointestinal system: Abdomen is nondistended, soft and nontender. Normal bowel sounds heard. Central nervous system: Not alert oriented. Withdraws to painful stimuli on right arm, but not on left. Moves right arm spontaneously slightly Extremities: No edema. No calf tenderness Skin: No cyanosis. No rashes Psychiatry: Judgement and insight impaired secondary to altered mental status  The results of significant diagnostics from this hospitalization (including imaging, microbiology, ancillary and laboratory) are listed below for reference.     Microbiology: No results found for this or any previous visit (from the past 240 hour(s)).   Labs: BNP (last 3 results) No results for input(s): BNP in the last 8760 hours. Basic Metabolic Panel:  Recent Labs Lab 11/12/16 0805 11/13/16 0446 11/15/16 0440  NA 133* 135 134*  K 5.5* 5.2* 5.0  CL 100* 99* 99*  CO2 26 26 27   GLUCOSE 112* 128* 103*  BUN 15 17 21*   CREATININE 0.81 0.76 0.82  CALCIUM 9.1 9.2 9.1   Liver Function Tests: No results for input(s): AST, ALT, ALKPHOS, BILITOT, PROT, ALBUMIN in the last 168 hours. No results for input(s): LIPASE, AMYLASE in the last 168 hours. No results for input(s): AMMONIA in the last 168 hours. CBC:  Recent Labs Lab 11/12/16 0805 11/13/16 0446 11/15/16 1237  WBC 16.6* 18.0* 14.4*  HGB 13.3 13.1 12.5  HCT 40.2 40.0 38.7  MCV 90.5 91.1 92.4  PLT 410* 406* 392   Cardiac Enzymes: No results for input(s): CKTOTAL, CKMB, CKMBINDEX, TROPONINI in the last 168 hours. BNP: Invalid input(s): POCBNP CBG:  Recent Labs Lab 11/14/16 2005 11/14/16 2355 11/15/16 0403 11/15/16 0803 11/15/16 1126  GLUCAP 109* 113* 100* 112* 117*   D-Dimer No results for input(s): DDIMER in the last 72 hours. Hgb A1c No results for input(s): HGBA1C in the last 72 hours. Lipid Profile No results for input(s): CHOL, HDL, LDLCALC, TRIG, CHOLHDL, LDLDIRECT in the last 72 hours. Thyroid function studies No results for input(s): TSH, T4TOTAL, T3FREE, THYROIDAB in the last 72 hours.  Invalid input(s): FREET3 Anemia work up No results for input(s): VITAMINB12, FOLATE, FERRITIN, TIBC, IRON, RETICCTPCT in the last 72 hours. Urinalysis    Component Value Date/Time   COLORURINE YELLOW 10/28/2016 0642   APPEARANCEUR HAZY (A) 10/28/2016 0642   LABSPEC 1.030 10/28/2016 0642   PHURINE 5.0 10/28/2016 0642   GLUCOSEU NEGATIVE 10/28/2016  1610   HGBUR LARGE (A) 10/28/2016 0642   BILIRUBINUR NEGATIVE 10/28/2016 0642   KETONESUR NEGATIVE 10/28/2016 0642   PROTEINUR 30 (A) 10/28/2016 0642   NITRITE NEGATIVE 10/28/2016 0642   LEUKOCYTESUR NEGATIVE 10/28/2016 9604    Time coordinating discharge: Over 30 minutes  SIGNED:   Jacquelin Hawking, MD [facility] 11/15/2016, 3:32 PM Pager [phone]  If 7PM-7AM, please contact night-coverage [email or web address] Password [email or web address]

## 2016-11-15 NOTE — Progress Notes (Signed)
Orthopedic Tech Progress Note Patient Details:  Ricardo JerichoViviana Sottile [birth year] [id number]  Ortho Devices Type of Ortho Device: Abdominal binder Ortho Device/Splint Location: abdomen Ortho Device/Splint Interventions: Freeman CaldronOrdered   Audianna Landgren 11/15/2016, 1:58 PM Viewed order from RN order list

## 2016-11-15 NOTE — Clinical Social Work Placement (Signed)
   CLINICAL SOCIAL WORK PLACEMENT  NOTE  Date:  11/15/2016  Patient Details  Name: Anne Olsen MRN: [id number] Date of Birth: 1976-08-22  Clinical Social Work is seeking post-discharge placement for this patient at the Skilled  Nursing Facility level of care (*CSW will initial, date and re-position this form in  chart as items are completed):  Yes   Patient/family provided with [facility] Social Work Department's list of facilities offering this level of care within the geographic area requested by the patient (or if unable, by the patient's family).  Yes   Patient/family informed of their freedom to choose among providers that offer the needed level of care, that participate in Medicare, Medicaid or managed care program needed by the patient, have an available bed and are willing to accept the patient.  Yes   Patient/family informed of 's ownership interest in [facility] and [facility], as well as of the fact that they are under no obligation to receive care at these facilities.  PASRR submitted to EDS on 11/06/16     PASRR number received on 11/06/16     Existing PASRR number confirmed on       FL2 transmitted to all facilities in geographic area requested by pt/family on 11/06/16     FL2 transmitted to all facilities within larger geographic area on       Patient informed that his/her managed care company has contracts with or will negotiate with certain facilities, including the following:        Yes   Patient/family informed of bed offers received.  [facility]     Physician recommends and patient chooses bed at      [facility].  Patient to be transferred to facility by [facility]     Patient family notified on 11/15/16 of transfer.  Name of family member notified:  [facility] El[facility]     PHYSICIAN        Additional Comment:    _______________________________________________ Margarito LinerSarah C Morine Kohlman, LCSW 11/15/2016, 3:42 PM

## 2016-11-16 ENCOUNTER — Other Ambulatory Visit: Payer: Self-pay

## 2016-11-16 MED ORDER — OXYCODONE HCL 5 MG PO TABS: 5.0000 mg | ORAL_TABLET | Freq: Three times a day (TID) | ORAL | 0 refills | Status: DC

## 2016-11-16 NOTE — Telephone Encounter (Signed)
Prescription request was received from:  [facility]  [address] [place] [address]  PHONE: [phone]   Fax: [phone] 

## 2016-11-17 LAB — CULTURE, RESPIRATORY W GRAM STAIN: Culture: NORMAL

## 2016-11-17 LAB — URINE CULTURE: Culture: >=100000 — AB

## 2016-11-17 LAB — CULTURE, RESPIRATORY

## 2016-11-19 ENCOUNTER — Encounter: Payer: Self-pay | Admitting: Adult Health

## 2016-11-19 ENCOUNTER — Non-Acute Institutional Stay (SKILLED_NURSING_FACILITY): Payer: 59 | Admitting: Adult Health

## 2016-11-19 DIAGNOSIS — I10 Essential (primary) hypertension: Secondary | ICD-10-CM

## 2016-11-19 DIAGNOSIS — J9610 Chronic respiratory failure, unspecified whether with hypoxia or hypercapnia: Secondary | ICD-10-CM | POA: Diagnosis not present

## 2016-11-19 DIAGNOSIS — I6300 Cerebral infarction due to thrombosis of unspecified precerebral artery: Secondary | ICD-10-CM | POA: Diagnosis not present

## 2016-11-19 NOTE — Progress Notes (Signed)
Location:   [facility] Room Number: 144 A Place of Service:  SNF (31)   CODE STATUS:  DNR  Allergies  Allergen Reactions  . No Known Allergies     Chief Complaint  Patient presents with  . 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 She is not responsive to stimuli. She has a trach and a peg tube. 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 Her family has decided to stop her tube feeding and allow her a natural death. They do wish for a hospice consult.   Past Medical History:  Diagnosis Date  . Hyperkalemia   . Hypertension     Past Surgical History:  Procedure Laterality Date  . ESOPHAGOGASTRODUODENOSCOPY N/A 10/10/2016   Procedure: ESOPHAGOGASTRODUODENOSCOPY (EGD);  Surgeon: Jimmye Norman, MD;  Location: [facility] ENDOSCOPY;  Service: General;  Laterality: N/A;  bedside  . PEG PLACEMENT N/A 10/10/2016   Procedure: PERCUTANEOUS ENDOSCOPIC GASTROSTOMY (PEG) PLACEMENT;  Surgeon: Jimmye Norman, MD;  Location: [facility] ENDOSCOPY;  Service: General;  Laterality: N/A;  . VENTRICULOPERITONEAL SHUNT N/A 11/02/2016   Procedure: SHUNT INSERTION VENTRICULAR-PERITONEAL;  Surgeon: Tia Alert, MD;  Location: [facility] OR;  Service: Neurosurgery;  Laterality: N/A;    Social History   Social History  . Marital status: Married    Spouse name: N/A  . Number of children: N/A  . Years of education: N/A   Occupational History  . Not on file.   Social History Main Topics  . Smoking status: Never Smoker  . Smokeless tobacco: Never Used  . Alcohol use Not on file  . Drug use: Unknown  . Sexual activity: Not on file   Other Topics Concern  . Not on file   Social History Narrative  . No narrative on file   History reviewed. No pertinent family history.    VITAL SIGNS BP 136/85   Pulse 91   Temp 100 F (37.8 C)   Resp 19   Ht 5\' 3"  (1.6 m)   Wt 144 lb (65.3 kg)   LMP  (LMP Unknown)  Comment: neg preg test 10/04/16  SpO2 93%   BMI 25.51 kg/m   Patient's Medications  New Prescriptions   No medications on file  Previous Medications   AMANTADINE (SYMMETREL) 50 MG/ 5 ML SOLUTION    Place 20 mLs (200 mg total) into feeding tube 2 (two) times daily.   AMINO ACIDS-PROTEIN HYDROLYS (FEEDING SUPPLEMENT, PRO-STAT SUGAR FREE 64,) LIQD    Place 30 mLs into feeding tube 2 (two) times daily.   BETHANECHOL (URECHOLINE) 10 MG TABLET    Place 1 tablet (10 mg total) into feeding tube every 6 (six) hours.   BISACODYL (DULCOLAX) 10 MG SUPPOSITORY    Place 1 suppository (10 mg total) rectally daily as needed for moderate constipation.   CHLORHEXIDINE (PERIDEX) 0.12 % SOLUTION    15 mLs by Mouth Rinse route 2 (two) times daily.   GLYCOPYRROLATE (ROBINUL) 1 MG TABLET    Take 1 tablet (1 mg total) by mouth 3 (three) times daily.   HALOPERIDOL (HALDOL) 0.5 MG TABLET    Place 3 tablets (1.5 mg total) into feeding tube 2 (two) times daily.   IPRATROPIUM-ALBUTEROL (DUONEB) 0.5-2.5 (3) MG/3ML SOLN    Take 3 mLs by nebulization every 6 (six) hours as needed.   NUTRITIONAL SUPPLEMENTS (FEEDING SUPPLEMENT, JEVITY 1.5 CAL,) LIQD  65 ml/hr x 18 hours via feeding tube   OXYCODONE (OXY IR/ROXICODONE) 5 MG IMMEDIATE RELEASE TABLET    Take 1 tablet (5 mg total) by mouth 3 (three) times daily.   PANTOPRAZOLE SODIUM (PROTONIX) 40 MG/20 ML PACK    Place 20 mLs (40 mg total) into feeding tube daily at 12 noon.   POLYETHYLENE GLYCOL (MIRALAX / GLYCOLAX) PACKET    Take 17 g by mouth daily.   SCOPOLAMINE (TRANSDERM-SCOP) 1 MG/3DAYS    Place 1 patch (1.5 mg total) onto the skin every 3 (three) days.   SENNA-DOCUSATE (SENOKOT-S) 8.6-50 MG TABLET    Take 1 tablet by mouth 2 (two) times daily.  Modified Medications   No medications on file  Discontinued Medications   NUTRITIONAL SUPPLEMENTS (FEEDING SUPPLEMENT, NEPRO CARB STEADY,) LIQD    Take 1,000 mLs by mouth daily.     SIGNIFICANT DIAGNOSTIC EXAMS   Review  of Systems  Unable to perform ROS: Patient unresponsive    Physical Exam  Constitutional: No distress.  Eyes: Conjunctivae are normal.  Neck: Neck supple. No JVD present. No thyromegaly present.  Cardiovascular: Normal rate, regular rhythm and intact distal pulses.   Respiratory: Effort normal and breath sounds normal. No respiratory distress. She has no wheezes.  Has trach   GI: Soft. Bowel sounds are normal. She exhibits no distension.  Has peg tube   Musculoskeletal: She exhibits no edema.  Has clonic and spastic movement of extremities on right side; no movement on left   Lymphadenopathy:    She has no cervical adenopathy.  Neurological:  Unresponsive   Skin: Skin is warm and dry. She is not diaphoretic.    ASSESSMENT/ PLAN:  1. 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 Hypertension 3 chronic respiratory failure  Will setup a palliative care consult as her husband would like to talk with palliative before going with hospice care. Will not make further changes until hospice steps in.    Time spent with patient  50  minutes >50% time spent counseling; reviewing medical record; tests; labs; and developing future plan of care    MD is aware of resident's narcotic use and is in agreement with current plan of care. We will attempt to wean resident as apropriate   Synthia Innocenteborah Green NP [facility]  Contact [phone] Monday through Friday 8am- 5pm  After hours call [phone]

## 2016-11-20 ENCOUNTER — Non-Acute Institutional Stay (SKILLED_NURSING_FACILITY): Payer: 59 | Admitting: Internal Medicine

## 2016-11-20 ENCOUNTER — Encounter: Payer: Self-pay | Admitting: Internal Medicine

## 2016-11-20 DIAGNOSIS — Z7189 Other specified counseling: Secondary | ICD-10-CM

## 2016-11-20 DIAGNOSIS — N390 Urinary tract infection, site not specified: Secondary | ICD-10-CM | POA: Diagnosis not present

## 2016-11-20 DIAGNOSIS — E875 Hyperkalemia: Secondary | ICD-10-CM | POA: Diagnosis not present

## 2016-11-20 DIAGNOSIS — Z982 Presence of cerebrospinal fluid drainage device: Secondary | ICD-10-CM

## 2016-11-20 DIAGNOSIS — Z430 Encounter for attention to tracheostomy: Secondary | ICD-10-CM | POA: Diagnosis not present

## 2016-11-20 DIAGNOSIS — R339 Retention of urine, unspecified: Secondary | ICD-10-CM | POA: Diagnosis not present

## 2016-11-20 DIAGNOSIS — I610 Nontraumatic intracerebral hemorrhage in hemisphere, subcortical: Secondary | ICD-10-CM

## 2016-11-20 DIAGNOSIS — J9610 Chronic respiratory failure, unspecified whether with hypoxia or hypercapnia: Secondary | ICD-10-CM

## 2016-11-20 DIAGNOSIS — Z931 Gastrostomy status: Secondary | ICD-10-CM | POA: Diagnosis not present

## 2016-11-20 DIAGNOSIS — I10 Essential (primary) hypertension: Secondary | ICD-10-CM

## 2016-11-20 NOTE — Progress Notes (Signed)
HISTORY AND PHYSICAL   DATE: 11/20/2016  Location:    [facility] Room Number: 144 A Place of Service: SNF (31)   Extended Emergency Contact Information Primary Emergency Contact: Elbakrawy,Mohammed Address: [address]          [place], [place] [address] D[place] of [place] Home Phone: [phone] Relation: Spouse  Advanced Directive information Does Patient Have a Medical Advance Directive?: Yes, Type of Advance Directive: Out of facility DNR (pink MOST or yellow form), Pre-existing out of facility DNR order (yellow form or pink MOST form): Yellow form placed in chart (order not valid for inpatient use), Does patient want to make changes to medical advance directive?: No - Patient declined  Chief Complaint  Patient presents with  . 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 She presented to ED from home with sudden HA at home--> left hemiparesis-->increased lethargy. Spouse called 911 and she was taken to the ED. Spouse reported she had missed BP med that day. She started OCP about 1 month prior to ED presentation. In the ED, she became unresponsive and had to be intubated. Her hospital course was complicated and prolonged. Initial CT head revealed acute intraparenchymal hemorrhage right thalamus with hematoma 6.38ml. VP shunt inserted by neurosx. She rec'd a trach collar during admission. Peg placed 10/10/16. Code status changed to DNR/DNI. UDS neg; WBC 14.4K; Hgb 12.5; LDL 103; B12 718; albumin 9.1-->4.7; TSH 0.285; K 5.2-->6.4-->5; Cr 1.01-->0.82; glucose 103; urine cx pending at d/c. She presents to SNF for long term care/possible hospice.  Today she is unresponsive. She has involuntary right sided limb jerks. Spouse, Mohammed, present and states  her right eyelid is droopy and she has "jelly" left calf. Prior to stroke, pt was active with yoga and other exercises. She is an Airline pilot. Mother at bedside also. Pt is a native of [place]. She is a poor historian due to nonverbal/unresponsive state. Hx obtained from chart. Advanced directives discussed with family at bedside. Pt's prognosis is poor at this time due to her neurologic status and treatment will only prolong her life but not improve her QOL. They desire comfort care at this time  Urine cx from hospital (+) E. Faecalis >100K colonies and sens to levafloxacin>ampicillin>vanco>nitrofurantoin.  Past Medical History:  Diagnosis Date  . Hyperkalemia   . Hypertension     Past Surgical History:  Procedure Laterality Date  . ESOPHAGOGASTRODUODENOSCOPY N/A 10/10/2016   Procedure: ESOPHAGOGASTRODUODENOSCOPY (EGD);  Surgeon: Jimmye Norman, MD;  Location: [facility] ENDOSCOPY;  Service: General;  Laterality: N/A;  bedside  . PEG PLACEMENT N/A 10/10/2016   Procedure: PERCUTANEOUS ENDOSCOPIC GASTROSTOMY (PEG) PLACEMENT;  Surgeon: Jimmye Norman, MD;  Location: [facility] ENDOSCOPY;  Service: General;  Laterality: N/A;  . VENTRICULOPERITONEAL SHUNT N/A 11/02/2016   Procedure: SHUNT INSERTION VENTRICULAR-PERITONEAL;  Surgeon: Tia Alert, MD;  Location: [facility] OR;  Service: Neurosurgery;  Laterality: N/A;    No care team member to display  Social History   Social History  . Marital status: Married    Spouse name: N/A  . Number of children: N/A  . Years of education: N/A   Occupational History  . Not on file.   Social History Main Topics  . Smoking status: Never Smoker  . Smokeless tobacco: Never Used  .  Alcohol use Not on file  . Drug use: Unknown  . Sexual activity: Not on file   Other Topics Concern  . Not on file   Social History Narrative  . No narrative on file     reports that she has never smoked. She has never used smokeless tobacco. Her alcohol and drug histories are not  on file.  History reviewed. No pertinent family history. No family status information on file.    Immunization History  Administered Date(s) Administered  . Influenza,inj,Quad PF,36+ Mos 10/06/2016  . PPD Test 11/15/2016    Allergies  Allergen Reactions  . No Known Allergies     Medications: Patient's Medications  New Prescriptions   No medications on file  Previous Medications   AMANTADINE (SYMMETREL) 50 MG/ 5 ML SOLUTION    Place 20 mLs (200 mg total) into feeding tube 2 (two) times daily.   AMINO ACIDS-PROTEIN HYDROLYS (FEEDING SUPPLEMENT, PRO-STAT SUGAR FREE 64,) LIQD    Place 30 mLs into feeding tube 2 (two) times daily.   BETHANECHOL (URECHOLINE) 10 MG TABLET    Place 1 tablet (10 mg total) into feeding tube every 6 (six) hours.   BISACODYL (DULCOLAX) 10 MG SUPPOSITORY    Place 1 suppository (10 mg total) rectally daily as needed for moderate constipation.   CHLORHEXIDINE (PERIDEX) 0.12 % SOLUTION    15 mLs by Mouth Rinse route 2 (two) times daily.   GLYCOPYRROLATE (ROBINUL) 1 MG TABLET    Take 1 tablet (1 mg total) by mouth 3 (three) times daily.   HALOPERIDOL (HALDOL) 0.5 MG TABLET    Place 3 tablets (1.5 mg total) into feeding tube 2 (two) times daily.   NUTRITIONAL SUPPLEMENTS (FEEDING SUPPLEMENT, JEVITY 1.5 CAL,) LIQD    65 ml/hr x 18 hours via feeding tube   OXYCODONE (OXY IR/ROXICODONE) 5 MG IMMEDIATE RELEASE TABLET    Take 1 tablet (5 mg total) by mouth 3 (three) times daily.   PANTOPRAZOLE SODIUM (PROTONIX) 40 MG/20 ML PACK    Place 20 mLs (40 mg total) into feeding tube daily at 12 noon.   POLYETHYLENE GLYCOL (MIRALAX / GLYCOLAX) PACKET    Take 17 g by mouth daily.   SCOPOLAMINE (TRANSDERM-SCOP) 1 MG/3DAYS    Place 1 patch (1.5 mg total) onto the skin every 3 (three) days.   SENNA-DOCUSATE (SENOKOT-S) 8.6-50 MG TABLET    Take 1 tablet by mouth 2 (two) times daily.  Modified Medications   No medications on file  Discontinued Medications   IPRATROPIUM-ALBUTEROL  (DUONEB) 0.5-2.5 (3) MG/3ML SOLN    Take 3 mLs by nebulization every 6 (six) hours as needed.    Review of Systems  Unable to perform ROS: Patient unresponsive    Vitals:   11/20/16 1132  BP: 136/85  Pulse: 91  Resp: 19  Temp: 100 F (37.8 C)  TempSrc: Oral  SpO2: 93%  Weight: 144 lb (65.3 kg)  Height: 5\' 3"  (1.6 m)   Body mass index is 25.51 kg/m.  Physical Exam  Constitutional: She appears well-developed. She has a sickly appearance.  Frail appearing, looks ill, in NAD, lying in bed  HENT:  Mouth/Throat: Oropharynx is clear and moist.  No mucosal lesions noted  Eyes: No scleral icterus. Right eye exhibits no nystagmus. Left eye exhibits no nystagmus. Right pupil is not reactive. Right pupil is round. Left pupil is not reactive. Left pupil is round. Pupils are equal.  Neck: Neck supple. Carotid bruit is not present. No tracheal deviation  present.    Cardiovascular: Normal rate, regular rhythm and intact distal pulses.  Exam reveals no gallop and no friction rub.   Murmur (1/6 SEM) heard. No LE edema b/l. no calf TTP.   Pulmonary/Chest: Effort normal. No accessory muscle usage or stridor. Tachypnea noted. No respiratory distress. She has no wheezes. She has rhonchi (b/l). She has no rales.  Abdominal: Soft. Bowel sounds are normal. She exhibits no distension and no mass. There is no hepatomegaly. There is no tenderness. There is no rebound and no guarding.  Peg tube clamped and intact; no redness/d/c at insertion site  Neurological: She is unresponsive. She displays no tremor. A cranial nerve deficit and sensory deficit is present. She exhibits abnormal muscle tone. She displays no seizure activity.  LUE/LLE flaccid; RUE/RLE spastic limb jerks  Skin: Skin is warm and dry. No rash noted.  moist  Psychiatric: She is noncommunicative.     Labs reviewed: Admission on 10/04/2016, Discharged on 11/15/2016  No results displayed because visit has over 200 results.  CBC Latest  Ref Rng & Units 11/15/2016 11/13/2016 11/12/2016  WBC 4.0 - 10.5 K/uL 14.4(H) 18.0(H) 16.6(H)  Hemoglobin 12.0 - 15.0 g/dL 40.9 81.1 91.4  Hematocrit 36.0 - 46.0 % 38.7 40.0 40.2  Platelets 150 - 400 K/uL 392 406(H) 410(H)   CMP Latest Ref Rng & Units 11/15/2016 11/13/2016 11/12/2016  Glucose 65 - 99 mg/dL 782(N) 562(Z) 308(M)  BUN 6 - 20 mg/dL 57(Q) 17 15  Creatinine 0.44 - 1.00 mg/dL 4.69 6.29 5.28  Sodium 135 - 145 mmol/L 134(L) 135 133(L)  Potassium 3.5 - 5.1 mmol/L 5.0 5.2(H) 5.5(H)  Chloride 101 - 111 mmol/L 99(L) 99(L) 100(L)  CO2 22 - 32 mmol/L 27 26 26   Calcium 8.9 - 10.3 mg/dL 9.1 9.2 9.1  Total Protein 6.5 - 8.1 g/dL - - -  Total Bilirubin 0.3 - 1.2 mg/dL - - -  Alkaline Phos 38 - 126 U/L - - -  AST 15 - 41 U/L - - -  ALT 14 - 54 U/L - - -   Lipid Panel     Component Value Date/Time   CHOL 182 10/06/2016 0413   TRIG 82 10/06/2016 0413   HDL 63 10/06/2016 0413   CHOLHDL 2.9 10/06/2016 0413   VLDL 16 10/06/2016 0413   LDLCALC 103 (H) 10/06/2016 0413   Lab Results  Component Value Date   HGBA1C 5.1 10/06/2016       Ct Head Wo Contrast  Result Date: 11/08/2016 CLINICAL DATA:  Followup intracranial hemorrhage October 04, 2016. Now with abnormal RIGHT-sided movements today. EXAM: CT HEAD WITHOUT CONTRAST TECHNIQUE: Contiguous axial images were obtained from the base of the skull through the vertex without intravenous contrast. COMPARISON:  CT HEAD November 02, 2016 and multiple priors. FINDINGS: BRAIN: Interval placement of ventriculoperitoneal shunt via RIGHT parietal burr hole with distal tip traversing midline within LEFT frontal horn of lateral ventricle. Slit-like RIGHT lateral ventricle. Interval removal of RIGHT frontal ventriculostomy catheter. RIGHT thalamus and RIGHT midbrain infarct. No intraparenchymal hemorrhage, mass effect or acute large vascular territory infarcts. Persistent mild effacement of the basal cisterns. VASCULAR: Unremarkable. SKULL/SOFT TISSUES: No skull  fracture. No significant soft tissue swelling. ORBITS/SINUSES: The included ocular globes and orbital contents are normal.The mastoid aircells and included paranasal sinuses are well-aerated. OTHER: None. IMPRESSION: Interim removal of RIGHT frontal ventriculostomy. New RIGHT parietal ventriculoperitoneal shunt with distal tip in the LEFT frontal horn of the lateral ventricle. Resolved hydrocephalus, slit-like RIGHT lateral ventricle.  Old RIGHT thalamus/ midbrain infarct. Electronically Signed   By: Awilda Metro M.D.   On: 11/08/2016 23:13   Ct Head Wo Contrast  Result Date: 11/02/2016 CLINICAL DATA:  Intracranial hemorrhage follow up EXAM: CT HEAD WITHOUT CONTRAST TECHNIQUE: Contiguous axial images were obtained from the base of the skull through the vertex without intravenous contrast. COMPARISON:  Head CT 10/31/2016 FINDINGS: Brain: Right frontal approach extraventricular drain catheter tip is in unchanged position. The size of the ventricles has decreased slightly, most noticeably at the third ventricle and temporal horns of the lateral ventricles. There is persistent periventricular hypoattenuation suggesting transependymal CSF flow. There is no acute hemorrhage. The basal cisterns remain crowded and the cerebral aqueduct remains effaced. Vascular: No hyperdense vessel or unexpected calcification. Skull: Right frontal burr hole. Sinuses/Orbits: The visualized portions of the paranasal sinuses and mastoid air cells are free of fluid. No advanced mucosal thickening. The visualized orbits are normal. Other: None IMPRESSION: 1. Slightly decreased size of the lateral and third ventricles with unchanged position of EVD catheter. Persistent periventricular hypoattenuation consistent with transependymal CSF resorption. 2. Cerebral aqueduct remains effaced. 3. No acute hemorrhage or new mass effect. Electronically Signed   By: Deatra Robinson M.D.   On: 11/02/2016 04:59   Ct Head Wo Contrast  Addendum Date:  10/31/2016   ADDENDUM REPORT: 10/31/2016 17:06 ADDENDUM: Case was discussed on 10/31/2016  at 5:05 pm with Dr. Marikay Alar. Electronically Signed   By: Marnee Spring M.D.   On: 10/31/2016 17:06   Result Date: 10/31/2016 CLINICAL DATA:  Altered mental status and hypertension EXAM: CT HEAD WITHOUT CONTRAST TECHNIQUE: Contiguous axial images were obtained from the base of the skull through the vertex without intravenous contrast. COMPARISON:  Yesterday FINDINGS: Brain: Marked dilatation of the lateral and third ventricles, unchanged from yesterday. There is transependymal CSF re- absorption and presumed flow along the right frontal ventriculostomy. Prior to the patient's CT she experienced apnea after EVD clamping in OR holding. Case discussed with ICU RN Tammy and findings are known to Dr Yetta Barre, the EVD is working, and patient has returned to pre clamping baseline. The EVD is in stable position, tip at the anterior septum pellucidum after traversing the frontal horn right lateral ventricle. Subacute low-density hematoma in the midbrain with fourth ventricular effacement. Vascular: No hyperdense vessel or unexpected calcification. Skull: Negative Sinuses/Orbits: Negative IMPRESSION: 1. 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 Degree is unchanged from yesterday. EVD positioning is stable. 2. Subacute hematoma in the midbrain. Electronically Signed: By: Marnee Spring M.D. On: 10/31/2016 16:56   Ct Head Wo Contrast  Result Date: 10/30/2016 CLINICAL DATA:  Intra cerebral hemorrhage.  Hydrocephalus. EXAM: CT HEAD WITHOUT CONTRAST TECHNIQUE: Contiguous axial images were obtained from the base of the skull through the vertex without intravenous contrast. COMPARISON:  CT head 10/24/2016, 10/04/2017 FINDINGS: Brain: Progressive hydrocephalus. Biventricular distance now measures 44.5 mm compared with 42.3 mm previously. Third and  lateral ventricles are dilated. Fourth ventricle is small due to obstructive hydrocephalus. Right frontal ventricular drain remains in good position in the right frontal horn. Prior hemorrhage in the midbrain and thalamus on the right is now low-density. This extends into the right middle cerebellar peduncle. No acute hemorrhage. No subdural fluid collection. Periventricular white matter hypodensity is progressed likely due to transependymal absorption of CSF. No acute infarct. Vascular: No hyperdense vessel or unexpected calcification.Negative Skull: Negative Sinuses/Orbits: Negative Other: None IMPRESSION: Progressive obstructive hydrocephalus compared  with the prior CT of 10/24/2016. Right ventricular drain remains in good position. Progression of transependymal resorption of CSF in the cerebral white matter. 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 No acute high-density hemorrhage. Electronically Signed   By: Marlan Palauharles  Clark M.D.   On: 10/30/2016 20:48   Ct Head Wo Contrast  Result Date: 10/24/2016 CLINICAL DATA:  41 y/o  F; hydrocephalus with shunt. EXAM: CT HEAD WITHOUT CONTRAST TECHNIQUE: Contiguous axial images were obtained from the base of the skull through the vertex without intravenous contrast. COMPARISON:  10/19/2016 MRI of the head.  10/14/2016 CT of the head. FINDINGS: Brain: Decreased attenuation of intra-axial hematoma centered in the right thalamus and midbrain and hemorrhage within occipital horns of lateral ventricles. Hypoattenuation of the brain with swelling of the brainstem compatible with edema is grossly stable. Hydrocephalus has worsened with increased size of lateral and third ventricles, diffuse sulcal effacement, and a minimal increase in tonsillar herniation and crowding in the posterior fossa. Lucency along the tract of right frontal approach ventriculostomy catheter is stable. The catheter tip abuts the septum  pellucidum and frontal horn of right lateral ventricle. No evidence for new large acute infarct or hemorrhage. Vascular: No hyperdense vessel or unexpected calcification. Skull: Normal. Negative for fracture or focal lesion. Sinuses/Orbits: No acute finding. Other: None. IMPRESSION: 1. Worsening hydrocephalus from prior MRI and CT with diffuse sulcal effacement and possible slight increase in cerebellar tonsillar herniation. 2. Decreased attenuation of midbrain and thalamus hemorrhage and stable associated edema and swelling of the brainstem. 3. No evidence for new large acute infarct or hemorrhage. These results will be called to the ordering clinician or representative by the Radiologist Assistant, and communication documented in the PACS or zVision Dashboard. Electronically Signed   By: Mitzi HansenLance  Furusawa-Stratton M.D.   On: 10/24/2016 14:31   Dg Chest Port 1 View  Result Date: 11/15/2016 CLINICAL DATA:  Fever. EXAM: PORTABLE CHEST 1 VIEW COMPARISON:  Radiographs of November 13, 2016. FINDINGS: Stable cardiomediastinal silhouette. Tracheostomy tube is unchanged in position. Right-sided ventriculoperitoneal shunt is again noted and unchanged. No pneumothorax or pleural effusion is noted. No acute pulmonary disease is noted. Bony thorax is unremarkable. IMPRESSION: No acute cardiopulmonary abnormality seen. Electronically Signed   By: Lupita RaiderJames  Green Jr, M.D.   On: 11/15/2016 10:53   Dg Chest Port 1 View  Result Date: 11/13/2016 CLINICAL DATA:  Leukocytosis. EXAM: PORTABLE CHEST 1 VIEW COMPARISON:  11/08/2016 FINDINGS: 1030 hours. Low volume film with asymmetric elevation right hemidiaphragm, stable. Cardiopericardial silhouette is at upper limits of normal for size. There is pulmonary vascular congestion without overt pulmonary edema in the degree of congestive change appears decreased in the interval. Tracheostomy tube and shunt catheter again noted. IMPRESSION: Interval decrease and pulmonary vascular congestion.  Electronically Signed   By: Kennith CenterEric  Mansell M.D.   On: 11/13/2016 11:44   Dg Chest Port 1 View  Result Date: 11/08/2016 CLINICAL DATA:  Tracheal secretions. EXAM: PORTABLE CHEST 1 VIEW COMPARISON:  11/04/2016 . FINDINGS: Tracheostomy tube in stable position. Ventricular peritoneal shunt tubing in stable position. Borderline cardiomegaly. Mild pulmonary vascular prominence and interstitial prominence. Mild CHF cannot be excluded. Mild pneumonitis cannot be excluded. Low lung volumes. No pneumothorax. IMPRESSION: 1. Tracheostomy tube and stent and VP shunt tubing in stable position. 2. Borderline cardiomegaly with mild bilateral interstitial prominence. Mild CHF cannot be excluded. Mild pneumonitis cannot be excluded. 3. Low lung volumes. Electronically Signed   By:  Thomas  Register   On: 11/08/2016 13:14   Dg Chest Port 1 View  Result Date: 11/04/2016 CLINICAL DATA:  Fever.  VP shunt insertion 11/02/2016 EXAM: PORTABLE CHEST 1 VIEW COMPARISON:  11/01/2016 FINDINGS: Tracheostomy remains in good position. Improved aeration in the lung bases with decreased airspace disease. Mild residual right lower lobe airspace disease. No effusion Interval placement of ventricular peritoneal shunt tubing overlying the right chest and abdomen. IMPRESSION: Tracheostomy remains in good position Improved aeration in the lung bases compared with the prior study. Electronically Signed   By: Marlan Palau M.D.   On: 11/04/2016 13:53   Dg Chest Port 1 View  Result Date: 11/01/2016 CLINICAL DATA:  Respiratory distress. EXAM: PORTABLE CHEST 1 VIEW COMPARISON:  One-view chest x-ray 10/24/2016 FINDINGS: The tracheostomy tube is stable. The heart size is exaggerated by low lung volumes. A diffuse interstitial pattern has increased, suggesting edema. No significant airspace consolidation is present. The visualized soft tissues and bony thorax are unremarkable. IMPRESSION: 1. Increasing interstitial pattern suggesting edema.  Electronically Signed   By: Marin Roberts M.D.   On: 11/01/2016 13:06   Dg Chest Port 1 View  Result Date: 10/24/2016 CLINICAL DATA:  Respiratory failure EXAM: PORTABLE CHEST 1 VIEW COMPARISON:  10/23/2016 FINDINGS: Tracheostomy tube is again seen in satisfactory position. Cardiac shadow is within normal limits. The lungs are well aerated bilaterally. Minimal bibasilar atelectasis is seen. No focal confluent infiltrate or sizable effusion is noted. No bony abnormality is seen. IMPRESSION: Minimal basilar atelectasis. Electronically Signed   By: Alcide Clever M.D.   On: 10/24/2016 07:53   Dg Chest Port 1 View  Result Date: 10/23/2016 CLINICAL DATA:  CVA, respiratory failure, tracheostomy patient. EXAM: PORTABLE CHEST 1 VIEW COMPARISON:  Portable chest x-ray of October 18, 2016 FINDINGS: The lungs are mildly hypoinflated. Bibasilar atelectasis persists. The pulmonary interstitial markings have normalized. The heart is top-normal in size but stable. The tracheostomy appliance tip projects at the inferior margin of the clavicular heads. IMPRESSION: Improved appearance of the pulmonary interstitium consistent with decreased edema. Persistent bibasilar atelectasis. Electronically Signed   By: David  [place] M.D.   On: 10/23/2016 07:09     Assessment/Plan   ICD-9-CM ICD-10-CM   1. Urinary tract infection without hematuria, site unspecified 599.0 N39.0   2. Nontraumatic subcortical hemorrhage of right cerebral hemisphere (HCC) 431 I61.0   3. S/P ventriculoperitoneal shunt V45.2 Z98.2   4. Tracheostomy care (HCC) V55.0 Z43.0   5. PEG (percutaneous endoscopic gastrostomy) status (HCC) V44.1 Z93.1   6. Chronic respiratory failure, unspecified whether with hypoxia or hypercapnia (HCC) 518.83 J96.10   7. Essential hypertension 401.9 I10   8. Hyperkalemia 276.7 E87.5   9. Urinary retention 788.20 R33.9   10. Advanced directives, counseling/discussion V65.49 Z71.89     hospice to follow  D/c ALL  meds/TF/labs  Start comfort care measures  GOAL: comfort care. Prognosis is poor. Communicated with pt's family and nursing.  Will follow  Walden Statz S. Ancil Linsey  [facility] [address] [place], [place] [address] [phone] Cell (Monday-Friday 8 AM - 5 PM) [phone] After 5 PM and follow prompts

## 2016-11-27 ENCOUNTER — Other Ambulatory Visit: Payer: Self-pay

## 2016-11-27 MED ORDER — OXYCODONE HCL 5 MG PO TABS: 5.0000 mg | ORAL_TABLET | Freq: Three times a day (TID) | ORAL | 0 refills | Status: DC | PRN

## 2016-11-27 NOTE — Telephone Encounter (Signed)
Prescription request was received from:  [facility]  [address] [place] [address]  PHONE: [phone]   Fax: [phone] 

## 2016-11-28 ENCOUNTER — Other Ambulatory Visit: Payer: Self-pay | Admitting: *Deleted

## 2016-11-28 MED ORDER — OXYCODONE HCL 5 MG PO TABS
ORAL_TABLET | ORAL | 0 refills | Status: AC
Start: 2016-11-28 — End: ?

## 2016-11-28 NOTE — Telephone Encounter (Signed)
[facility] #: [phone] Fax#: [phone]  

## 2016-11-29 ENCOUNTER — Other Ambulatory Visit: Payer: Self-pay

## 2016-11-29 NOTE — Addendum Note (Signed)
Addended by: Maurice SmallBEATTY, Khyla Mccumbers C on: 11/29/2016 10:47 AM   Modules accepted: Orders

## 2016-11-29 NOTE — Telephone Encounter (Signed)
Pregnancy warning came up when trying to refill Oxycodone for nursing home patient, please advise

## 2016-11-29 NOTE — Telephone Encounter (Signed)
[facility], I was informed patient expired on 02-15-2017

## 2016-12-09 DEATH — deceased

## 2017-10-15 IMAGING — DX DG CHEST 1V PORT
1 series · 1 of 1 positions shown · non-contrast
Comparison: 10/23/2016

CLINICAL DATA: Respiratory failure

EXAM:
PORTABLE CHEST 1 VIEW

[chest ap]
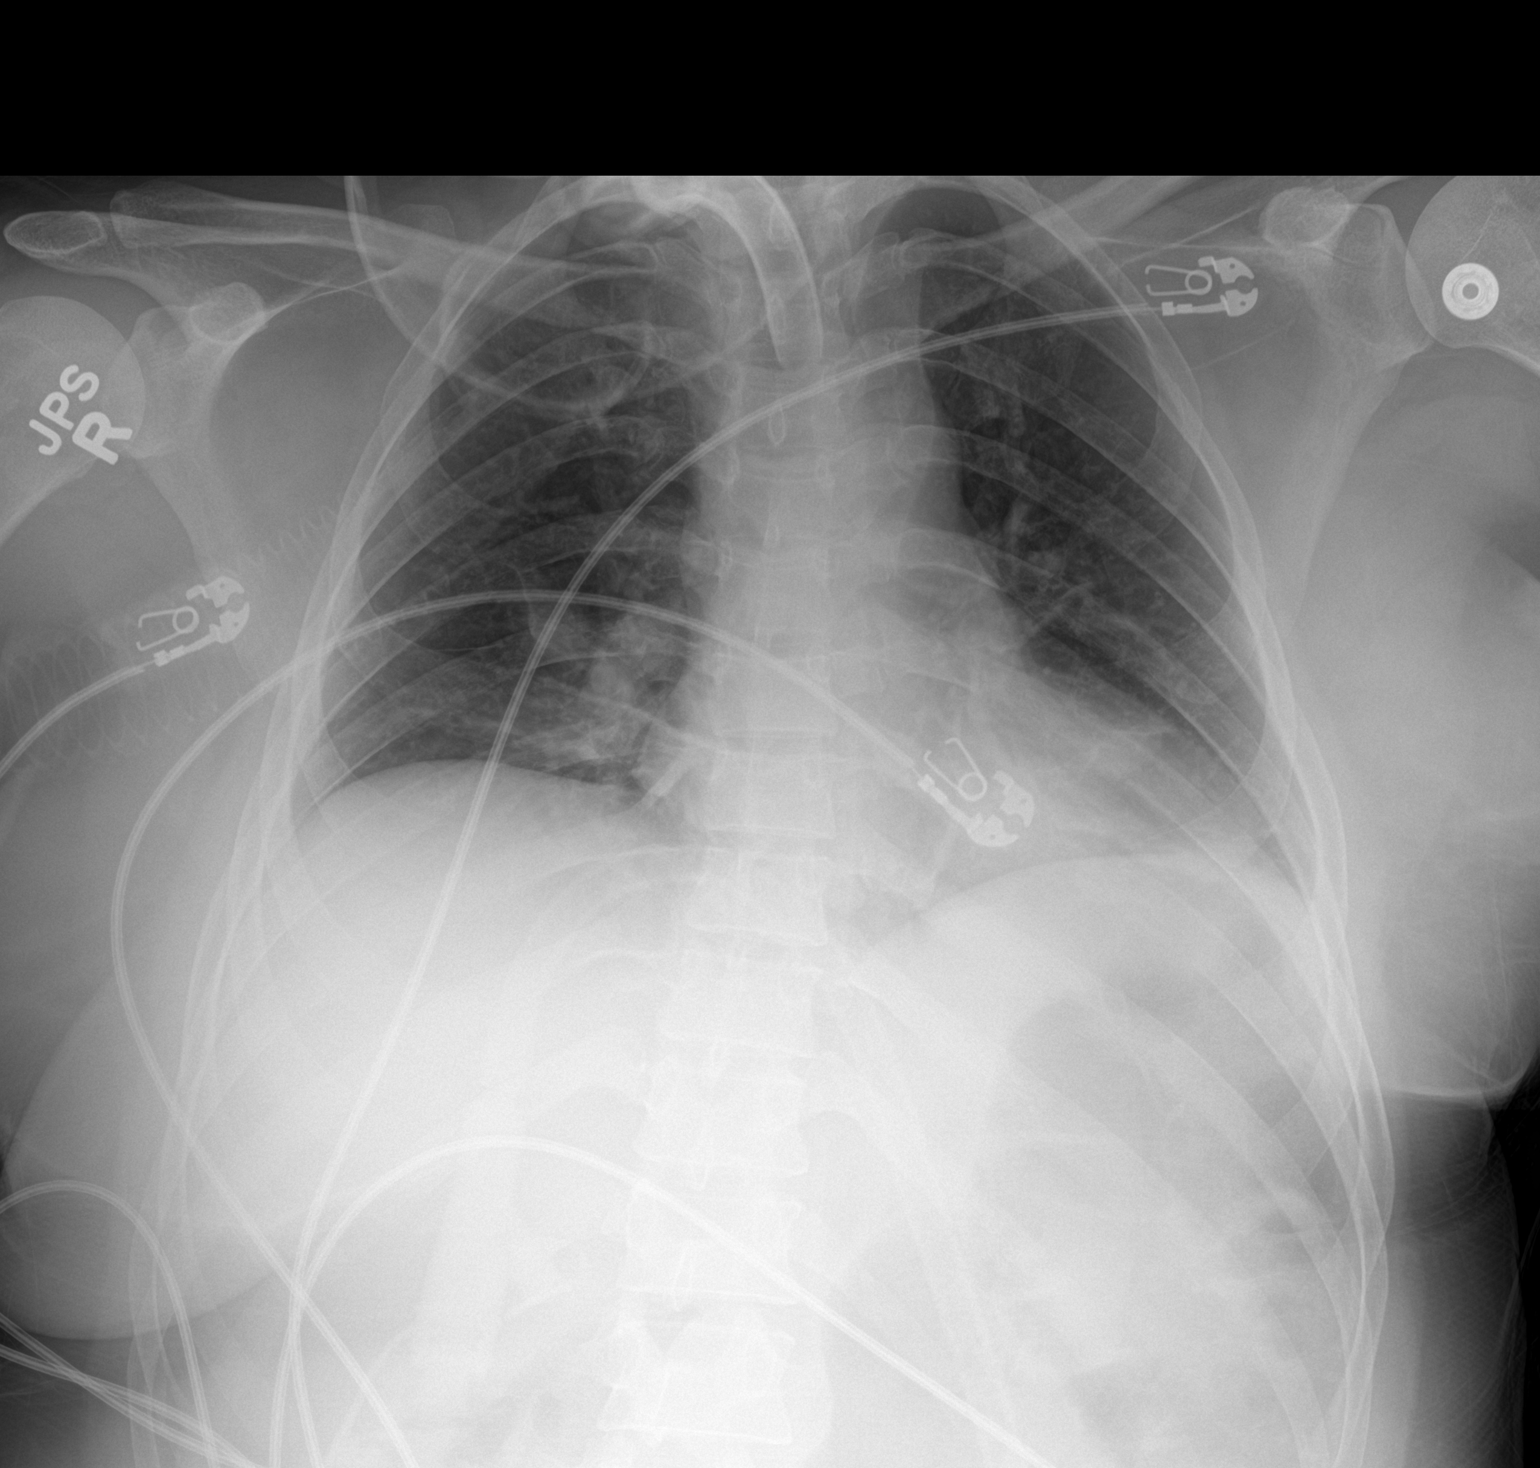

[1 of 1 positions shown; findings below may reference images not displayed]

FINDINGS: Tracheostomy tube is again seen in satisfactory position. Cardiac
shadow is within normal limits. The lungs are well aerated
bilaterally. Minimal bibasilar atelectasis is seen. No focal
confluent infiltrate or sizable effusion is noted. No bony
abnormality is seen.
IMPRESSION: Minimal basilar atelectasis.

## 2017-10-15 IMAGING — CT CT HEAD W/O CM
3 of 4 series · 17 of 47 positions shown, 20 images · non-contrast
Comparison: 10/19/2016 MRI of the head.  10/14/2016 CT of the head.

CLINICAL DATA: 40 y/o  F; hydrocephalus with shunt.

EXAM:
CT HEAD WITHOUT CONTRAST
TECHNIQUE: Contiguous axial images were obtained from the base of the skull
through the vertex without intravenous contrast.

[Series 201: head w/o, idose (1) · axial · non-contrast · 0.41mm/px · z∈[+659,+789]mm · 11 of 32 slices shown, 14 images]
[im 3/32  brain]
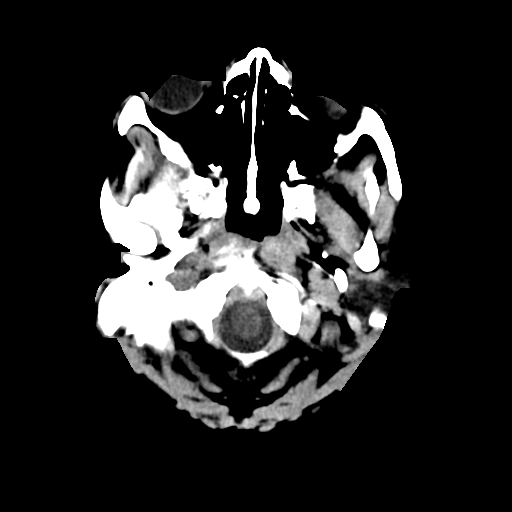
[im 3/32  bone]
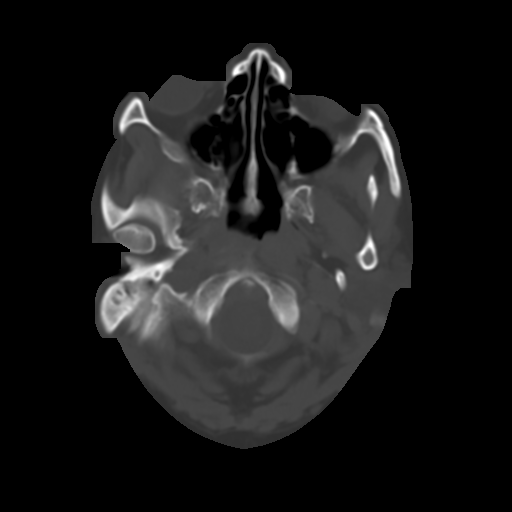
[im 5/32  brain]
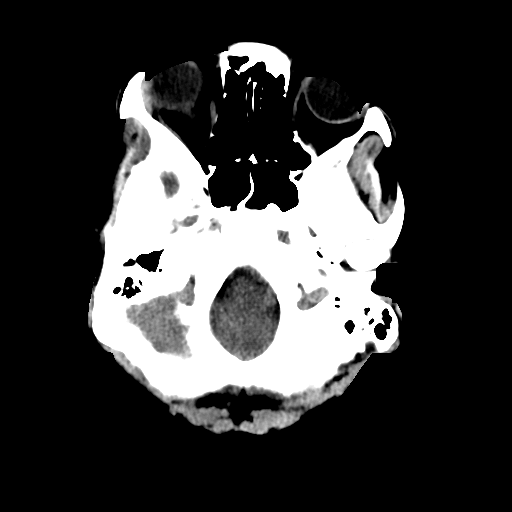
[im 7/32  brain]
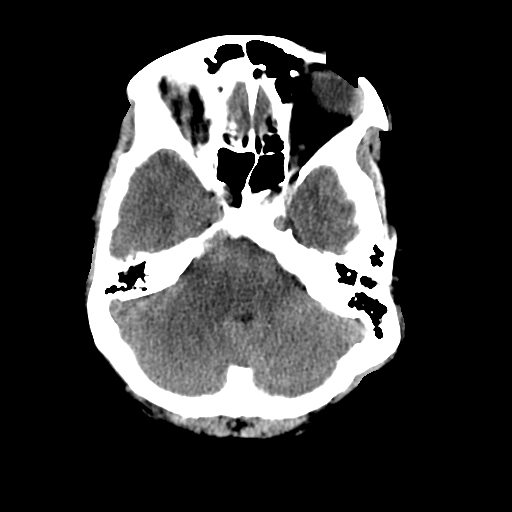
[im 12/32  brain]
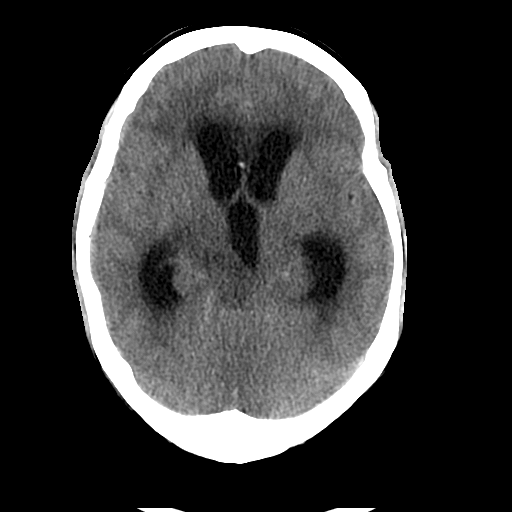
[im 14/32  brain]
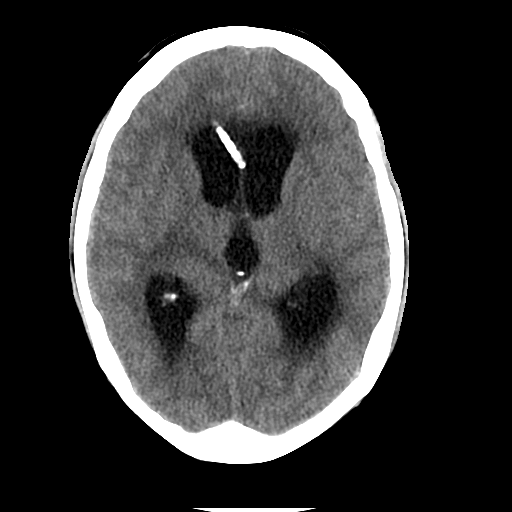
[im 14/32  bone]
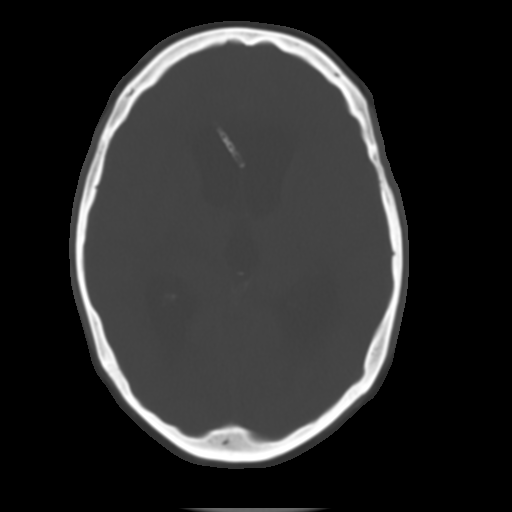
[im 16/32  brain]
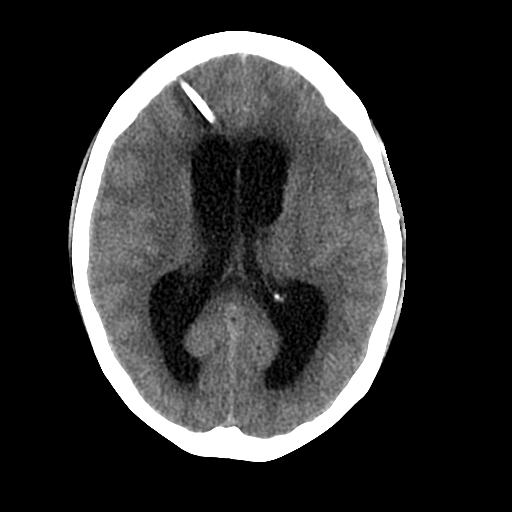
[im 18/32  brain]
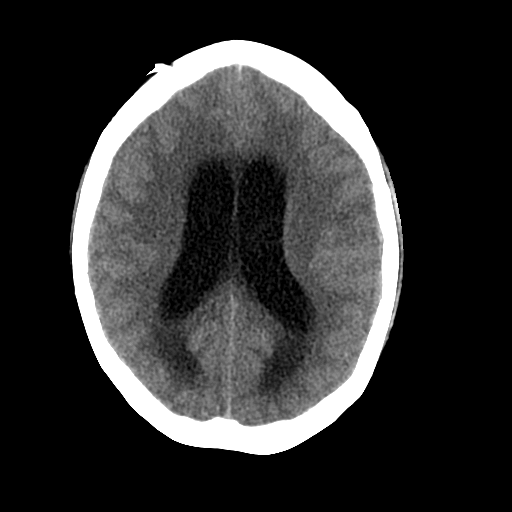
[im 20/32  brain]
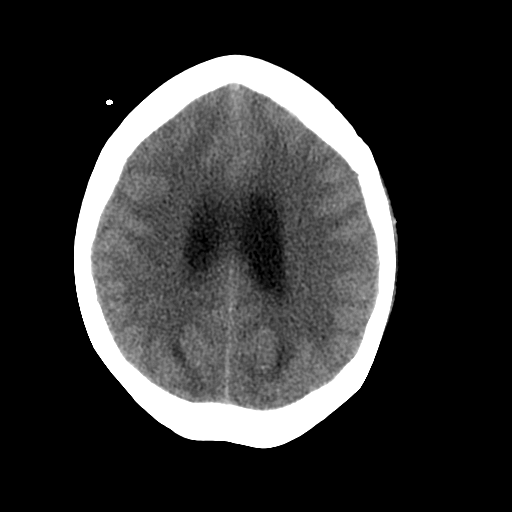
[im 25/32  brain]
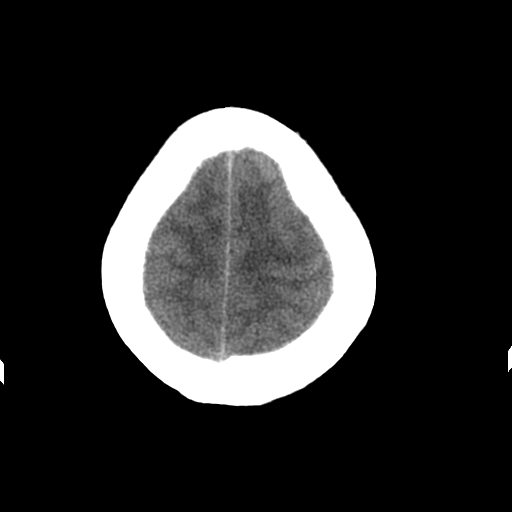
[im 25/32  bone]
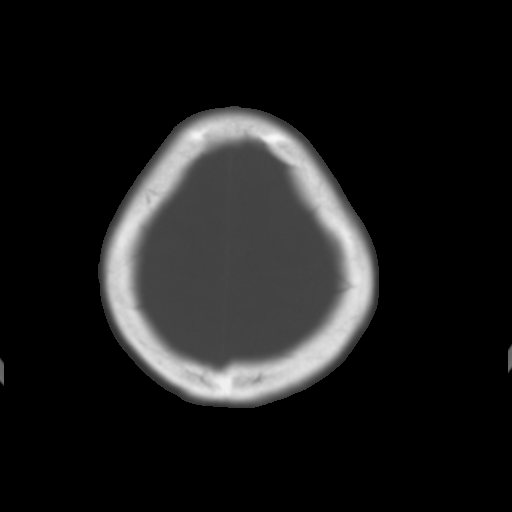
[im 27/32  brain]
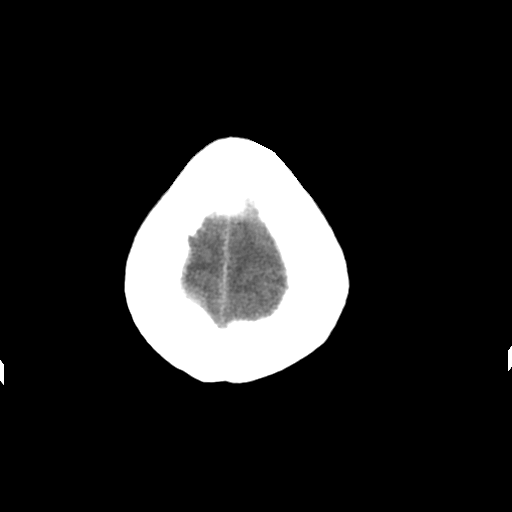
[im 29/32  brain]
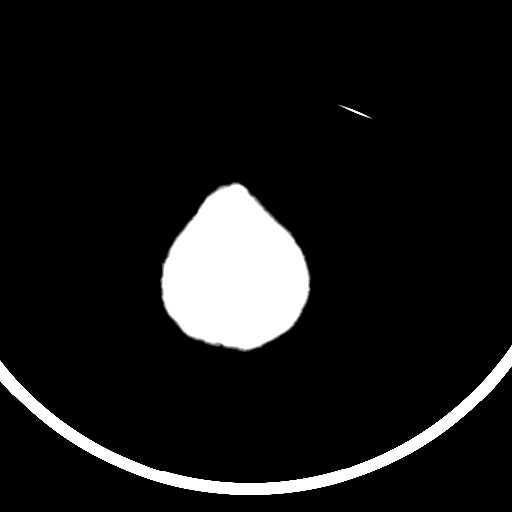

[Series 203: coronal st, idose (1) · coronal · 0.40mm/px · 3 of 69 slices shown]
[im 23/69  brain]
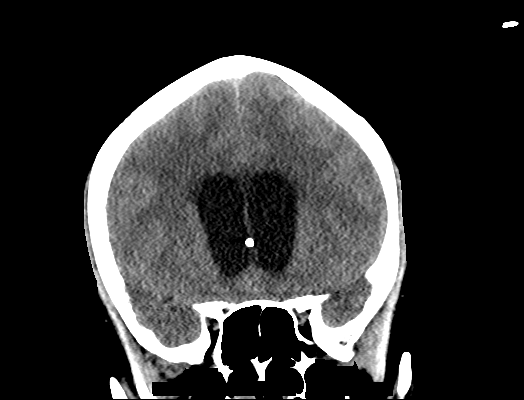
[im 31/69  brain]
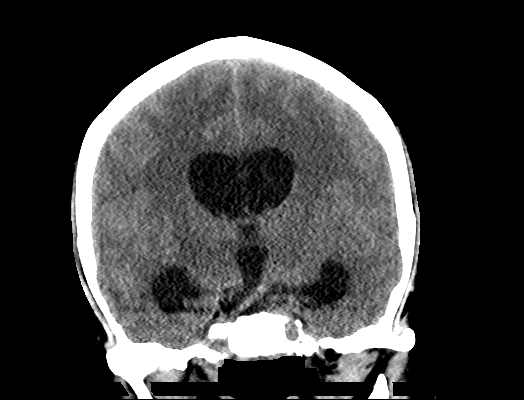
[im 38/69  brain]
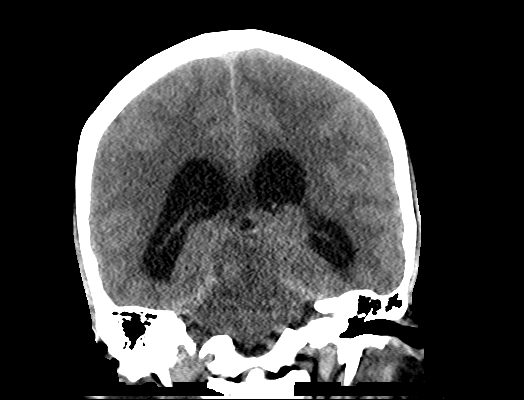

[Series 204: sagittal st, idose (1) · sagittal · 0.40mm/px · 3 of 70 slices shown]
[im 24/70  brain]
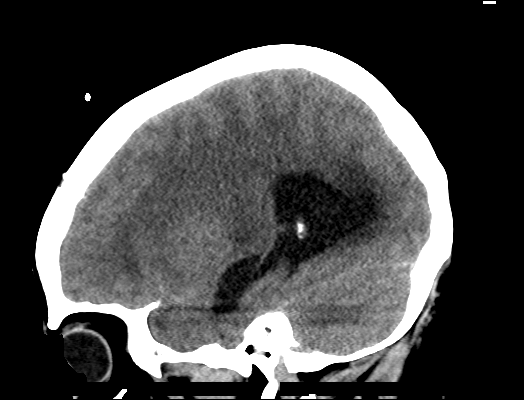
[im 35/70  brain]
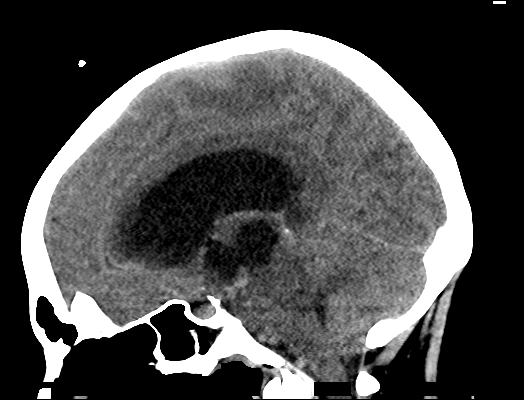
[im 47/70  brain]
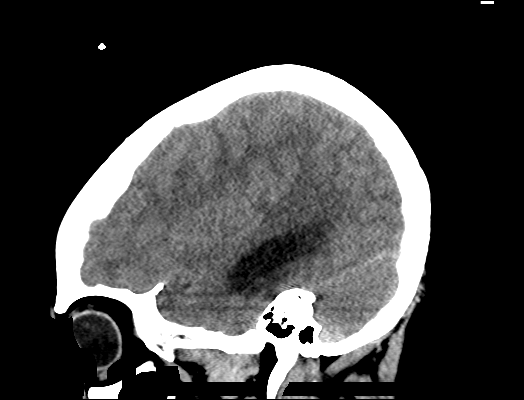

[17 of 47 positions shown; findings below may reference images not displayed]

FINDINGS: Brain: Decreased attenuation of intra-axial hematoma centered in the
right thalamus and midbrain and hemorrhage within occipital horns of
lateral ventricles. Hypoattenuation of the brain with swelling of
the brainstem compatible with edema is grossly stable. Hydrocephalus
has worsened with increased size of lateral and third ventricles,
diffuse sulcal effacement, and a minimal increase in tonsillar
herniation and crowding in the posterior fossa. Lucency along the
tract of right frontal approach ventriculostomy catheter is stable.
The catheter tip abuts the septum pellucidum and frontal horn of
right lateral ventricle. No evidence for new large acute infarct or
hemorrhage.

Vascular: No hyperdense vessel or unexpected calcification.

Skull: Normal. Negative for fracture or focal lesion.

Sinuses/Orbits: No acute finding.

Other: None.
IMPRESSION: 1. Worsening hydrocephalus from prior MRI and CT with diffuse sulcal
effacement and possible slight increase in cerebellar tonsillar
herniation.
2. Decreased attenuation of midbrain and thalamus hemorrhage and
stable associated edema and swelling of the brainstem.
3. No evidence for new large acute infarct or hemorrhage.
These results will be called to the ordering clinician or
representative by the Radiologist Assistant, and communication
documented in the PACS or zVision Dashboard.

By: Goretta Mei M.D.
